# Patient Record
Sex: Male | Born: 1935
Health system: Southern US, Community
[De-identification: ages and names within clinical notes are randomized; demographics above are authoritative.]

## PROBLEM LIST (undated history)

## (undated) DIAGNOSIS — H409 Unspecified glaucoma: Secondary | ICD-10-CM

## (undated) DIAGNOSIS — E119 Type 2 diabetes mellitus without complications: Secondary | ICD-10-CM

## (undated) DIAGNOSIS — I2510 Atherosclerotic heart disease of native coronary artery without angina pectoris: Secondary | ICD-10-CM

## (undated) DIAGNOSIS — M129 Arthropathy, unspecified: Secondary | ICD-10-CM

## (undated) DIAGNOSIS — I10 Essential (primary) hypertension: Secondary | ICD-10-CM

## (undated) DIAGNOSIS — N400 Enlarged prostate without lower urinary tract symptoms: Secondary | ICD-10-CM

## (undated) DIAGNOSIS — I219 Acute myocardial infarction, unspecified: Secondary | ICD-10-CM

## (undated) DIAGNOSIS — M1990 Unspecified osteoarthritis, unspecified site: Secondary | ICD-10-CM

## (undated) DIAGNOSIS — K219 Gastro-esophageal reflux disease without esophagitis: Secondary | ICD-10-CM

## (undated) DIAGNOSIS — H269 Unspecified cataract: Secondary | ICD-10-CM

## (undated) DIAGNOSIS — L570 Actinic keratosis: Secondary | ICD-10-CM

## (undated) DIAGNOSIS — F0390 Unspecified dementia without behavioral disturbance: Secondary | ICD-10-CM

## (undated) DIAGNOSIS — H3530 Unspecified macular degeneration: Secondary | ICD-10-CM

## (undated) DIAGNOSIS — G2581 Restless legs syndrome: Secondary | ICD-10-CM

## (undated) HISTORY — DX: Unspecified macular degeneration: H35.30

## (undated) HISTORY — DX: Unspecified cataract: H26.9

## (undated) HISTORY — DX: Unspecified glaucoma: H40.9

## (undated) HISTORY — PX: COLONOSCOPY: SHX174

## (undated) HISTORY — DX: Actinic keratosis: L57.0

## (undated) HISTORY — PX: OTHER SURGICAL HISTORY: SHX169

## (undated) HISTORY — DX: Unspecified osteoarthritis, unspecified site: M19.90

## (undated) HISTORY — DX: Unspecified dementia, unspecified severity, without behavioral disturbance, psychotic disturbance, mood disturbance, and anxiety: F03.90

## (undated) HISTORY — PX: NO PAST SURGERIES: SHX2092

## (undated) HISTORY — DX: Arthropathy, unspecified: M12.9

## (undated) HISTORY — DX: Essential (primary) hypertension: I10

## (undated) HISTORY — DX: Type 2 diabetes mellitus without complications: E11.9

## (undated) HISTORY — DX: Acute myocardial infarction, unspecified: I21.9

## (undated) HISTORY — DX: Benign prostatic hyperplasia without lower urinary tract symptoms: N40.0

## (undated) HISTORY — DX: Gastro-esophageal reflux disease without esophagitis: K21.9

## (undated) HISTORY — DX: Restless legs syndrome: G25.81

## (undated) HISTORY — PX: CORONARY ANGIOPLASTY WITH STENT PLACEMENT: SHX49

---

## 2008-12-23 ENCOUNTER — Encounter: Payer: Self-pay | Admitting: Internal Medicine

## 2008-12-23 LAB — CONVERTED CEMR LAB
BUN: 19 mg/dL
Calcium: 9.3 mg/dL
Cholesterol: 137 mg/dL
Glucose, Bld: 102 mg/dL
LDL Cholesterol: 78 mg/dL
Potassium: 4.7 meq/L
Sodium: 140 meq/L
Total Protein: 6.9 g/dL

## 2009-06-01 ENCOUNTER — Encounter: Payer: Self-pay | Admitting: Internal Medicine

## 2009-06-02 ENCOUNTER — Encounter: Payer: Self-pay | Admitting: Internal Medicine

## 2009-09-26 ENCOUNTER — Encounter: Admission: RE | Admit: 2009-09-26 | Discharge: 2009-12-25 | Payer: Self-pay | Admitting: Optometry

## 2009-10-16 ENCOUNTER — Ambulatory Visit: Payer: Self-pay | Admitting: Internal Medicine

## 2009-10-16 DIAGNOSIS — M129 Arthropathy, unspecified: Secondary | ICD-10-CM | POA: Insufficient documentation

## 2009-10-16 DIAGNOSIS — H409 Unspecified glaucoma: Secondary | ICD-10-CM | POA: Insufficient documentation

## 2009-10-16 DIAGNOSIS — I10 Essential (primary) hypertension: Secondary | ICD-10-CM | POA: Insufficient documentation

## 2009-10-16 DIAGNOSIS — E118 Type 2 diabetes mellitus with unspecified complications: Secondary | ICD-10-CM

## 2009-10-18 ENCOUNTER — Encounter: Payer: Self-pay | Admitting: Internal Medicine

## 2009-10-18 DIAGNOSIS — N400 Enlarged prostate without lower urinary tract symptoms: Secondary | ICD-10-CM

## 2009-11-13 ENCOUNTER — Telehealth: Payer: Self-pay | Admitting: Internal Medicine

## 2009-11-15 ENCOUNTER — Encounter: Payer: Self-pay | Admitting: Internal Medicine

## 2009-12-08 ENCOUNTER — Encounter: Payer: Self-pay | Admitting: Internal Medicine

## 2009-12-11 ENCOUNTER — Telehealth: Payer: Self-pay | Admitting: Internal Medicine

## 2009-12-19 ENCOUNTER — Encounter: Payer: Self-pay | Admitting: Internal Medicine

## 2010-01-19 ENCOUNTER — Encounter: Payer: Self-pay | Admitting: Internal Medicine

## 2010-01-30 ENCOUNTER — Telehealth: Payer: Self-pay | Admitting: Internal Medicine

## 2010-02-21 ENCOUNTER — Telehealth: Payer: Self-pay | Admitting: Internal Medicine

## 2010-03-20 NOTE — Progress Notes (Signed)
Summary: prodigy glucose monitor  Phone Note Call from Patient Call back at Home Phone [phone]   Caller: Son Summary of Call: Md recieved letter from pt/ son requesting a prescription for the Prodigy voice glucose monitor. Pls send to [facility] [address] per md calld pt son to see how many times dad test so we can send strip & lancets also. son did not ansew LMOM RTC Initial call taken by: Orlan Leavens RMA,  November 13, 2009 10:00 AM  Follow-up for Phone Call        Once a day he tests, he was told to call back,per son. Follow-up by: Verdell Face,  November 13, 2009 2:03 PM  Additional Follow-up for Phone Call Additional follow up Details #1::        Sent Prodigy voice & supllies to [facility] [address] Additional Follow-up by: Orlan Leavens RMA,  November 13, 2009 2:39 PM    New/Updated Medications: PRODIGY VOICE BLOOD GLUCOSE W/DEVICE KIT (BLOOD GLUCOSE MONITORING SUPPL) use as directed PRODIGY NO CODING BLOOD GLUC  STRP (GLUCOSE BLOOD) use to check blood sugar once daily  dx: 250.00 PRODIGY SAFETY LANCETS 26G  MISC (LANCETS) use as directed  dx 250.00 Prescriptions: PRODIGY SAFETY LANCETS 26G  MISC (LANCETS) use as directed  dx 250.00  #30 x 6   Entered by:   Orlan Leavens RMA   Authorized by:   Newt Lukes MD   Signed by:   Orlan Leavens RMA on 11/13/2009   Method used:   Electronically to        [facility] [address] (retail)       [phone]       [place], [place]  [address]       Ph: [phone]       Fax: [phone]   RxID:   [id number] PRODIGY NO CODING BLOOD GLUC  STRP (GLUCOSE BLOOD) use to check blood sugar once daily  dx: 250.00  #30 x 6   Entered by:   Orlan Leavens RMA   Authorized by:   Newt Lukes MD   Signed by:   Orlan Leavens RMA on 11/13/2009   Method used:   Electronically to        [facility] [address] (retail)       [phone]       [place], [place]  [address]     Ph: [phone]       Fax: [phone]   RxID:   [id number] PRODIGY VOICE BLOOD GLUCOSE W/DEVICE KIT (BLOOD GLUCOSE MONITORING SUPPL) use as directed  #1 x 0   Entered by:   Orlan Leavens RMA   Authorized by:   Newt Lukes MD   Signed by:   Orlan Leavens RMA on 11/13/2009   Method used:   Electronically to        [facility] [address] (retail)       [phone]       [place], [place]  [address]       Ph: [phone]       Fax: [phone]   RxID:   [id number]

## 2010-03-20 NOTE — Letter (Signed)
Summary: Mercy Riding Sr MD  Mercy Riding Sr MD   Imported By: Lester Gratis 11/01/2009 09:47:34  _____________________________________________________________________  External Attachment:    Type:   Image     Comment:   External Document

## 2010-03-20 NOTE — Miscellaneous (Signed)
Summary: Order for low vision OT/[facility]  Order for low vision OT/[facility]   Imported By: Sherian Rein 12/21/2009 07:36:13  _____________________________________________________________________  External Attachment:    Type:   Image     Comment:   External Document

## 2010-03-20 NOTE — Miscellaneous (Signed)
Summary: Order/[facility]  Order/[facility]   Imported By: Sherian Rein 01/22/2010 12:12:29  _____________________________________________________________________  External Attachment:    Type:   Image     Comment:   External Document

## 2010-03-20 NOTE — Assessment & Plan Note (Signed)
Summary: NEW PT / MEDICARE/ [facility]/ [facility]  #   Vital Signs:  Patient profile:   77 year old male Height:      70 inches (177.80 cm) Weight:      190.4 pounds (86.55 kg) BMI:     27.42 O2 Sat:      97 % on Room air Temp:     97.0 degrees F (36.11 degrees C) oral Pulse rate:   67 / minute BP sitting:   124 / 72  (left arm) Cuff size:   regular  Vitals Entered By: Orlan Leavens RMA (October 16, 2009 9:46 AM)  O2 Flow:  Room air CC: New patient Is Patient Diabetic? Yes Did you bring your meter with you today? No Pain Assessment Patient in pain? no       Does patient need assistance? Functional Status Self care Ambulation Normal Comments Req refill on Clonazepam   Primary Care Provider:  Newt Lukes MD  CC:  New patient.  History of Present Illness: new pt to me and our practice, here to est care here with son, PCP care prev in [place] until 06/2009  1) DM2 - reports compliance with ongoing medical treatment and no changes in medication dose or frequency. denies adverse side effects related to current therapy. no signs or symptoms hypoglycemia - checks sugars infreq due to difficulty seeing blood drop after fingerstick - ?new meter to help this  2) HTN - reports compliance with ongoing medical treatment and no changes in medication dose or frequency. denies adverse side effects related to current therapy.  no CP, HA or edema  3) BPH - reports compliance with ongoing medical treatment and no changes in medication dose or frequency. denies adverse side effects related to current therapy. no dizziness or orthostatc symptoms - no dysuria or hematuria or incontinence  4) glaucoma - also macular degeneration - legally blind and does not drive due to same - follows with local optho  5) RLS - use klonopin at bedtime for control of same -   Preventive Screening-Counseling & Management  Alcohol-Tobacco     Alcohol drinks/day: 0     Alcohol Counseling: not indicated;  patient does not drink     Smoking Status: never     Tobacco Counseling: not indicated; no tobacco use  Caffeine-Diet-Exercise     Does Patient Exercise: yes     Depression Counseling: not indicated; screening negative for depression  Clinical Review Panels:  Immunizations   Last Flu Vaccine:  Fluvax 3+ (10/16/2009)   Current Medications (verified): 1)  Ranitidine Hcl 300 Mg Caps (Ranitidine Hcl) .... Take 1 At Bedtime 2)  Doxazosin Mesylate 8 Mg Tabs (Doxazosin Mesylate) .... Take 1 At Bedtime 3)  Clonazepam 0.5 Mg Tabs (Clonazepam) .... Take 1 At Bedtime 4)  Complete Allergy 25 Mg Caps (Diphenhydramine Hcl) .... Take 1 Two Times A Day 5)  Lisinopril 2.5 Mg Tabs (Lisinopril) .... Take 1 By Mouth Once Daily 6)  Glimepiride 2 Mg Tabs (Glimepiride) .... Take 1 By Mouth Once Daily 7)  Aspirin 325 Mg Tabs (Aspirin) .... Once Daily 8)  Icaps  Caps (Multiple Vitamins-Minerals) .... Take 2 By Mouth Once Daily 9)  Mega Red .... Take 1 By Mouth Once Daily  Allergies (verified): No Known Drug Allergies  Past History:  Past Medical History: Diabetes mellitus, type II Hypertension glaucoma macular degeneration, L>R vision loss GERD allergic rhinitis BPH restless leg syndrome  MD roster: optho - [facility] ([facility])  Past  Surgical History: Denies surgical history  Family History: reviewed - noncontributory  Social History: Never Smoked, no alcohol sngle, widowed 06/2006 moved to [place] from [place] 06/2009 to be near son/dtr lives alone & no driving, legally blind due to macular degeneration Smoking Status:  never Does Patient Exercise:  yes  Review of Systems       see HPI above. I have reviewed all other systems and they were negative.   Physical Exam  General:  alert, well-developed, well-nourished, and cooperative to examination.    Eyes:  vision grossly diminished L>R eye;  conjunctiva and lids normal.    Ears:  R ear normal and L ear normal.  wears bilateral  hearing aides Mouth:  full upper plate and gums in good repair; mucous membranes moist, without lesions or ulcers. oropharynx clear without exudate, no erythema.  Neck:  supple, full ROM, no masses, no thyromegaly; no thyroid nodules or tenderness. no JVD or carotid bruits.   Lungs:  normal respiratory effort, no intercostal retractions or use of accessory muscles; normal breath sounds bilaterally - no crackles and no wheezes.    Heart:  normal rate, regular rhythm, no murmur, and no rub. BLE without edema. normal DP pulses and normal cap refill in all 4 extremities    Abdomen:  soft, non-tender, normal bowel sounds, no distention; no masses and no appreciable hepatomegaly or splenomegaly.   Msk:  No deformity or scoliosis noted of thoracic or lumbar spine.   Neurologic:  alert & oriented X3 and cranial nerves II-XII symetrically intact.  strength normal in all extremities, sensation intact to light touch, and gait normal. speech fluent without dysarthria or aphasia; follows commands with good comprehension.  Skin:  no rashes, vesicles, ulcers, or erythema. No nodules or irregularity to palpation.  Psych:  Oriented X3, memory intact for recent and remote, normally interactive, good eye contact, not anxious appearing, not depressed appearing, and not agitated.      Impression & Recommendations:  Problem # 1:  DIABETES MELLITUS, TYPE II (ICD-250.00)  His updated medication list for this problem includes:    Lisinopril 2.5 Mg Tabs (Lisinopril) .Marland Kitchen... Take 1 by mouth once daily    Glimepiride 2 Mg Tabs (Glimepiride) .Marland Kitchen... Take 1 by mouth once daily    Aspirin 325 Mg Tabs (Aspirin) ..... Once daily  Orders: TLB-A1C / Hgb A1C (Glycohemoglobin) (83036-A1C) TLB-Creatinine, Blood (82565-CREA)  Problem # 2:  HYPERTENSION (ICD-401.9)  His updated medication list for this problem includes:    Doxazosin Mesylate 8 Mg Tabs (Doxazosin mesylate) .Marland Kitchen... Take 1 at bedtime    Lisinopril 2.5 Mg Tabs  (Lisinopril) .Marland Kitchen... Take 1 by mouth once daily  BP today: 124/72  Problem # 3:  GLUCOMA (ICD-365.9) L>R eye vision loss - related to glaucoma and macular degen - legally blind - depends on others for transportation -  need to eval if glucometer available for "finding the blood" after lance - unable to see to touch strip to drop follows with local optho - [facility]  Problem # 4:  ARTHRITIS (ICD-716.90)  Problem # 5:  BENIGN PROSTATIC HYPERTROPHY, WITH OBSTRUCTION (ICD-600.01)  His updated medication list for this problem includes:    Doxazosin Mesylate 8 Mg Tabs (Doxazosin mesylate) .Marland Kitchen... Take 1 at bedtime  Time spent with patient and son 45 minutes, more than 50% of this time was spent counseling patient on diabetes, reviewing current medications, need to review prior records and plans for f/u  Complete Medication List: 1)  Ranitidine Hcl 300  Mg Caps (Ranitidine hcl) .... Take 1 at bedtime 2)  Doxazosin Mesylate 8 Mg Tabs (Doxazosin mesylate) .... Take 1 at bedtime 3)  Clonazepam 0.5 Mg Tabs (Clonazepam) .... Take 1 at bedtime 4)  Complete Allergy 25 Mg Caps (Diphenhydramine hcl) .... Take 1 two times a day 5)  Lisinopril 2.5 Mg Tabs (Lisinopril) .... Take 1 by mouth once daily 6)  Glimepiride 2 Mg Tabs (Glimepiride) .... Take 1 by mouth once daily 7)  Aspirin 325 Mg Tabs (Aspirin) .... Once daily 8)  Icaps Caps (Multiple vitamins-minerals) .... Take 2 by mouth once daily 9)  Mega Red  .... Take 1 by mouth once daily  Other Orders: Flu Vaccine 68yrs + ([address]) Administration Flu vaccine - MCR (N5621) Flu Vaccine Consent Questions     Do you have a history of severe allergic reactions to this vaccine? no    Any prior history of allergic reactions to egg and/or gelatin? no    Do you have a sensitivity to the preservative Thimersol? no    Do you have a past history of Guillan-Barre Syndrome? no    Do you currently have an acute febrile illness? no    Have you ever had a severe reaction to  latex? no    Vaccine information given and explained to patient? yes    Are you currently pregnant? no    Lot Number:[id number]   Exp Date:08/18/2010   Site Given  Right Deltoid IM  Patient Instructions: 1)  it was good to see you today. 2)  medications and medical history reviewed today - no changes in medications at this time 3)  test(s) ordered today - your results will be posted on the phone tree for review in 48-72 hours from the time of test completion; call [phone] and enter your 9 digit MRN (listed above on this page, just below your name); if any changes need to be made or there are abnormal results, you will be contacted directly. 4)  refills as discussed - 5)  will send for copy of prior records to review from dr. chase 6)  will look into new meter and strips for monitoring your sugars 7)  Please schedule a follow-up appointment in 3-6 months, sooner if problems. will monitor your a1c and other labs as needed  Prescriptions: CLONAZEPAM 0.5 MG TABS (CLONAZEPAM) take 1 at bedtime  #30 x 6   Entered and Authorized by:   Newt Lukes MD   Signed by:   Newt Lukes MD on 10/16/2009   Method used:   Print then Give to Patient   RxID:   [id number]    .lbmedflu

## 2010-03-20 NOTE — Progress Notes (Signed)
Summary: Verbal  Phone Note Other Incoming   Caller: Erie Noe RN with [facility] [phone] Summary of Call: RN called to ask MD is it is okay for pt to contiune with OT and Aide in home due to vision. Verbal ok Initial call taken by: Margaret Pyle, CMA,  December 11, 2009 11:08 AM  Follow-up for Phone Call        yes - ok - thanks Follow-up by: Newt Lukes MD,  December 11, 2009 11:27 AM  Additional Follow-up for Phone Call Additional follow up Details #1::        Verbal okay given to Erie Noe RN at [facility] Additional Follow-up by: Margaret Pyle, CMA,  December 11, 2009 12:42 PM

## 2010-03-20 NOTE — Medication Information (Signed)
Summary: Rx request for Glucose meter/Patient  Rx request for Glucose meter/Patient   Imported By: Sherian Rein 11/15/2009 08:56:08  _____________________________________________________________________  External Attachment:    Type:   Image     Comment:   External Document

## 2010-03-20 NOTE — Miscellaneous (Signed)
Summary: OT Order / [facility]  OT Order / [facility]   Imported By: Lennie Odor 12/12/2009 10:23:26  _____________________________________________________________________  External Attachment:    Type:   Image     Comment:   External Document

## 2010-03-22 NOTE — Progress Notes (Signed)
Summary: [facility]  Phone Note Refill Request Message from:  Fax from Pharmacy on January 30, 2010 8:49 AM  Refills Requested: Medication #1:  DOXAZOSIN MESYLATE 8 MG TABS take 1 at bedtime  Method Requested: Electronic Initial call taken by: Orlan Leavens RMA,  January 30, 2010 8:49 AM    Prescriptions: DOXAZOSIN MESYLATE 8 MG TABS (DOXAZOSIN MESYLATE) take 1 at bedtime  #30 x 4   Entered by:   Orlan Leavens RMA   Authorized by:   Newt Lukes MD   Signed by:   Orlan Leavens RMA on 01/30/2010   Method used:   Electronically to        [facility] [address] (retail)       [phone]       [place], [place]  [address]       Ph: [phone]       Fax: [phone]   RxID:   [id number]

## 2010-03-22 NOTE — Progress Notes (Signed)
Summary: amaryl  Phone Note Refill Request Message from:  Fax from Pharmacy on February 21, 2010 10:17 AM  Refills Requested: Medication #1:  GLIMEPIRIDE 2 MG TABS take 1 by mouth once daily   Last Refilled: 11/30/2009  Method Requested: Electronic Initial call taken by: Orlan Leavens RMA,  February 21, 2010 10:17 AM    Prescriptions: GLIMEPIRIDE 2 MG TABS (GLIMEPIRIDE) take 1 by mouth once daily  #90 x 2   Entered by:   Orlan Leavens RMA   Authorized by:   Newt Lukes MD   Signed by:   Orlan Leavens RMA on 02/21/2010   Method used:   Electronically to        [facility] [address] (retail)       [phone]       [place], [place]  [address]       Ph: [phone]       Fax: [phone]   RxID:   [id number]

## 2010-03-23 ENCOUNTER — Encounter: Payer: Self-pay | Admitting: Internal Medicine

## 2010-04-05 NOTE — Letter (Signed)
Summary: Medical exam forms/[facility]  Medical exam forms/[facility]   Imported By: Sherian Rein 03/29/2010 09:29:42  _____________________________________________________________________  External Attachment:    Type:   Image     Comment:   External Document

## 2010-04-16 ENCOUNTER — Other Ambulatory Visit: Payer: Self-pay | Admitting: Internal Medicine

## 2010-04-16 ENCOUNTER — Encounter: Payer: Self-pay | Admitting: Internal Medicine

## 2010-04-16 ENCOUNTER — Other Ambulatory Visit: Payer: Medicare Other

## 2010-04-16 ENCOUNTER — Ambulatory Visit (INDEPENDENT_AMBULATORY_CARE_PROVIDER_SITE_OTHER): Payer: Medicare Other | Admitting: Internal Medicine

## 2010-04-16 DIAGNOSIS — L570 Actinic keratosis: Secondary | ICD-10-CM | POA: Insufficient documentation

## 2010-04-16 DIAGNOSIS — E119 Type 2 diabetes mellitus without complications: Secondary | ICD-10-CM

## 2010-04-16 LAB — CREATININE, SERUM: Creatinine, Ser: 1.2 mg/dL (ref 0.4–1.5)

## 2010-04-16 LAB — MICROALBUMIN / CREATININE URINE RATIO: Microalb Creat Ratio: 0.2 mg/g (ref 0.0–30.0)

## 2010-04-17 ENCOUNTER — Encounter (INDEPENDENT_AMBULATORY_CARE_PROVIDER_SITE_OTHER): Payer: Self-pay | Admitting: *Deleted

## 2010-04-26 NOTE — Assessment & Plan Note (Signed)
Summary: 3-6 MTH FU/D/T--STC   Vital Signs:  Patient profile:   77 year old male Height:      70 inches (177.80 cm) Weight:      191.12 pounds (86.87 kg) O2 Sat:      95 % on Room air Temp:     97.5 degrees F (36.39 degrees C) oral Pulse rate:   71 / minute BP sitting:   110 / 62  (left arm) Cuff size:   regular  Vitals Entered By: Orlan Leavens RMA (April 16, 2010 9:11 AM)  O2 Flow:  Room air CC: 6 month follow-up Is Patient Diabetic? Yes Did you bring your meter with you today? Yes Pain Assessment Patient in pain? no      6  Primary Care Provider:  Newt Lukes MD  CC:  6 month follow-up.  History of Present Illness: here for f/u here with son, PCP care prev in [place] until 06/2009  1) DM2 - reports compliance with ongoing medical treatment and no changes in medication dose or frequency. denies adverse side effects related to current therapy. no signs or symptoms hypoglycemia - checks sugars infreq due to difficulty seeing blood drop after fingerstick -  2) HTN - reports compliance with ongoing medical treatment and no changes in medication dose or frequency. denies adverse side effects related to current therapy.  no CP, HA or edema  3) BPH - reports compliance with ongoing medical treatment and no changes in medication dose or frequency. denies adverse side effects related to current therapy. no dizziness or orthostatic symptoms - no dysuria or hematuria or incontinence  4) glaucoma - also macular degeneration - legally blind and does not drive due to same - follows with local optho  5) RLS - use klonopin at bedtime for control of same -   Clinical Review Panels:  Lipid Management   Cholesterol:  137 (12/23/2008)   LDL (bad choesterol):  78 (12/23/2008)   HDL (good cholesterol):  25 (12/23/2008)   Triglycerides:  171 (12/23/2008)  Diabetes Management   HgBA1C:  5.6 (10/16/2009)   Creatinine:  1.3 (10/16/2009)   Last Flu Vaccine:  Fluvax 3+  (10/16/2009)  Complete Metabolic Panel   Glucose:  102 (12/23/2008)   Sodium:  140 (12/23/2008)   Potassium:  4.7 (12/23/2008)   Chloride:  102 (12/23/2008)   CO2:  28 (12/23/2008)   BUN:  19 (12/23/2008)   Creatinine:  1.3 (10/16/2009)   Albumin:  4.3 (12/23/2008)   Total Protein:  6.9 (12/23/2008)   Calcium:  9.3 (12/23/2008)   Total Bili:  0.7 (12/23/2008)   Alk Phos:  46 (12/23/2008)   SGPT (ALT):  20 (12/23/2008)   SGOT (AST):  15 (12/23/2008)   Current Medications (verified): 1)  Ranitidine Hcl 300 Mg Caps (Ranitidine Hcl) .... Take 1 At Bedtime 2)  Doxazosin Mesylate 8 Mg Tabs (Doxazosin Mesylate) .... Take 1 At Bedtime 3)  Clonazepam 0.5 Mg Tabs (Clonazepam) .... Take 1 At Bedtime 4)  Complete Allergy 25 Mg Caps (Diphenhydramine Hcl) .... Take 1 Two Times A Day 5)  Lisinopril 2.5 Mg Tabs (Lisinopril) .... Take 1 By Mouth Once Daily 6)  Glimepiride 2 Mg Tabs (Glimepiride) .... Take 1 By Mouth Once Daily 7)  Aspirin 325 Mg Tabs (Aspirin) .... Once Daily 8)  Icaps  Caps (Multiple Vitamins-Minerals) .... Take 2 By Mouth Once Daily 9)  Mega Red .... Take 1 By Mouth Once Daily 10)  Prodigy No Coding Blood Gluc  Strp (Glucose  Blood) .... Use To Check Blood Sugar Once Daily  Dx: 250.00 11)  Prodigy Safety Lancets 26g  Misc (Lancets) .... Use As Directed  Dx 250.00 12)  Tylenol Pm Extra Strength 500-25 Mg Tabs (Diphenhydramine-Apap (Sleep)) .... Take 1 By Mouth At Bedtime 13)  Lumigan 0.03 % Soln (Bimatoprost) .... Use 1 Drop in Both Eyes At Bedtime 14)  Timolol Maleate 0.5 % Soln (Timolol Maleate) .... Use 1 Drop Two Times A Day in Both Eyes  Allergies (verified): No Known Drug Allergies  Past History:  Past Medical History: Diabetes mellitus, type II Hypertension glaucoma macular degeneration, L>R vision loss GERD allergic rhinitis BPH restless leg syndrome   MD roster: optho - [facility] ([facility])  Social History: Never Smoked, no alcohol sngle, widowed 06/2006    moved to [place] from [place] 06/2009 to be near son/dtr lives alone & no driving, legally blind due to macular degeneration  Review of Systems  The patient denies anorexia, fever, weight loss, weight gain, peripheral edema, headaches, and abdominal pain.    Physical Exam  General:  alert, well-developed, well-nourished, and cooperative to examination.    Lungs:  normal respiratory effort, no intercostal retractions or use of accessory muscles; normal breath sounds bilaterally - no crackles and no wheezes.    Heart:  normal rate, regular rhythm, no murmur, and no rub. BLE without edema. normal DP pulses and normal cap refill in all 4 extremities    Skin:  mulitple AK on scalp, one slightly pearly appearance Psych:  Oriented X3, memory intact for recent and remote, normally interactive, good eye contact, not anxious appearing, not depressed appearing, and not agitated.      Impression & Recommendations:  Problem # 1:  DIABETES MELLITUS, TYPE II (ICD-250.00)  His updated medication list for this problem includes:    Lisinopril 2.5 Mg Tabs (Lisinopril) .Marland Kitchen... Take 1 by mouth once daily    Glimepiride 2 Mg Tabs (Glimepiride) .Marland Kitchen... Take 1 by mouth once daily    Aspirin 325 Mg Tabs (Aspirin) ..... Once daily  Orders: TLB-A1C / Hgb A1C (Glycohemoglobin) (83036-A1C) TLB-Microalbumin/Creat Ratio, Urine (82043-MALB) TLB-Creatinine, Blood (82565-CREA)  Labs Reviewed: Creat: 1.3 (10/16/2009)    Reviewed HgBA1c results: 5.6 (10/16/2009)  Problem # 2:  ACTINIC KERATOSIS (ICD-702.0)  Orders: Dermatology Referral (Derma)  Complete Medication List: 1)  Ranitidine Hcl 300 Mg Caps (Ranitidine hcl) .... Take 1 at bedtime 2)  Doxazosin Mesylate 8 Mg Tabs (Doxazosin mesylate) .... Take 1 at bedtime 3)  Clonazepam 0.5 Mg Tabs (Clonazepam) .... Take 1 at bedtime 4)  Complete Allergy 25 Mg Caps (Diphenhydramine hcl) .... Take 1 two times a day 5)  Lisinopril 2.5 Mg Tabs (Lisinopril) .... Take 1 by  mouth once daily 6)  Glimepiride 2 Mg Tabs (Glimepiride) .... Take 1 by mouth once daily 7)  Aspirin 325 Mg Tabs (Aspirin) .... Once daily 8)  Icaps Caps (Multiple vitamins-minerals) .... Take 2 by mouth once daily 9)  Mega Red  .... Take 1 by mouth once daily 10)  Prodigy No Coding Blood Gluc Strp (Glucose blood) .... Use to check blood sugar once daily  dx: 250.00 11)  Prodigy Safety Lancets 26g Misc (Lancets) .... Use as directed  dx 250.00 12)  Tylenol Pm Extra Strength 500-25 Mg Tabs (Diphenhydramine-apap (sleep)) .... Take 1 by mouth at bedtime 13)  Lumigan 0.03 % Soln (Bimatoprost) .... Use 1 drop in both eyes at bedtime 14)  Timolol Maleate 0.5 % Soln (Timolol maleate) .... Use 1 drop  two times a day in both eyes  Patient Instructions: 1)  it was good to see you today. 2)  medications and medical history reviewed today - no changes in medications at this time 3)  test(s) ordered today - your results will be  called to you after review in 48-72 hours from the time of test completion; if any changes need to be made or there are abnormal results, you will be contacted directly. 4)  refills as discussed - 5)  Please schedule a follow-up appointment in 3-6 months, sooner if problems. will monitor your a1c and other labs as needed  Prescriptions: CLONAZEPAM 0.5 MG TABS (CLONAZEPAM) take 1 at bedtime  #90 x 1   Entered and Authorized by:   Newt Lukes MD   Signed by:   Newt Lukes MD on 04/16/2010   Method used:   Print then Give to Patient   RxID:   [id number]    Orders Added: 1)  Est. Patient Level IV [[address]78] 2)  TLB-A1C / Hgb A1C (Glycohemoglobin) [83036-A1C] 3)  TLB-Microalbumin/Creat Ratio, Urine [82043-MALB] 4)  TLB-Creatinine, Blood [82565-CREA] 5)  Dermatology Referral [Derma]

## 2010-04-26 NOTE — Letter (Addendum)
Summary: Primary Care Consult Scheduled Letter  [facility]  [address] [place], [place] [address]   Phone: [phone]  Fax: [phone]      04/17/2010 MRN: [id number]  [facility] [address] [place], [place]  [address]  [place]    Dear Mr. Erman,      We have scheduled an appointment for you.  At the recommendation of Dr.Leschber, we have scheduled you a consult with [facility] ( Dr. Emily Filbert) on March 21,2012 at 11:10 Am / arrive at 10:00 am Their address is [address], [place], [place] . The office phone number is [phone]  If this appointment day and time is not convenient for you, please feel free to call the office of the doctor you are being referred to at the number listed above and reschedule the appointment.     It is important for you to keep your scheduled appointments. We are here to make sure you are given good patient care. If you have questions or you have made changes to your appointment, please notify us at  [phone]        , ask for   Debra           .   -Please bring Insurance card  -Photo ID -Any  co-pay  due at time of visit  Please give a 24 hr notice if you can not make this appt to [facility]  to avoid a $25.00  cancellation fee   Thank you,  Patient Care Coordinator [facility]

## 2010-06-07 ENCOUNTER — Other Ambulatory Visit: Payer: Self-pay

## 2010-06-07 MED ORDER — DOXAZOSIN MESYLATE 8 MG PO TABS: 8.0000 mg | ORAL_TABLET | Freq: Every day | ORAL | Status: DC

## 2010-06-11 ENCOUNTER — Other Ambulatory Visit: Payer: Self-pay

## 2010-06-11 MED ORDER — RANITIDINE HCL 300 MG PO TABS: 300.0000 mg | ORAL_TABLET | Freq: Every day | ORAL | Status: DC

## 2010-08-09 ENCOUNTER — Encounter: Payer: Self-pay | Admitting: Internal Medicine

## 2010-08-09 ENCOUNTER — Ambulatory Visit (INDEPENDENT_AMBULATORY_CARE_PROVIDER_SITE_OTHER): Payer: Medicare Other | Admitting: Internal Medicine

## 2010-08-09 ENCOUNTER — Other Ambulatory Visit: Payer: Self-pay | Admitting: *Deleted

## 2010-08-09 VITALS — BP 122/66 | HR 72 | Temp 98.3°F | Ht 70.0 in | Wt 193.4 lb

## 2010-08-09 DIAGNOSIS — J209 Acute bronchitis, unspecified: Secondary | ICD-10-CM

## 2010-08-09 MED ORDER — RANITIDINE HCL 300 MG PO TABS: 300.0000 mg | ORAL_TABLET | Freq: Every day | ORAL | Status: DC

## 2010-08-09 MED ORDER — GLIMEPIRIDE 2 MG PO TABS: 2.0000 mg | ORAL_TABLET | Freq: Every day | ORAL | Status: DC

## 2010-08-09 MED ORDER — AMOXICILLIN-POT CLAVULANATE 875-125 MG PO TABS: 1.0000 | ORAL_TABLET | Freq: Two times a day (BID) | ORAL | Status: AC

## 2010-08-09 MED ORDER — LISINOPRIL 2.5 MG PO TABS: 2.5000 mg | ORAL_TABLET | Freq: Every day | ORAL | Status: DC

## 2010-08-09 MED ORDER — DOXAZOSIN MESYLATE 8 MG PO TABS: 8.0000 mg | ORAL_TABLET | Freq: Every day | ORAL | Status: DC

## 2010-08-09 MED ORDER — CLONAZEPAM 0.5 MG PO TABS: 0.5000 mg | ORAL_TABLET | Freq: Every evening | ORAL | Status: DC | PRN

## 2010-08-09 NOTE — Telephone Encounter (Signed)
[facility] med to [email or web address] KitchenMarland Kitchen6/21/12@11 :21am/LMB

## 2010-08-09 NOTE — Patient Instructions (Signed)
It was good to see you today. Augmentin antibiotics for your bronchitis - Your prescription(s) have been submitted to your pharmacy. Please take as directed and contact our office if you believe you are having problem(s) with the medication(s). Use Mucinex DM pills 2x/day as needed for cough symptoms

## 2010-08-09 NOTE — Progress Notes (Signed)
  Subjective:    Patient ID: Timothy Snyder, male    DOB: [birth year], 77 y.o.   MRN: [id number]  HPI complains of cough Onset 2 weeks ago Precipitated by URI and sick exposures Productive sputum - green and yellow, no blood LGF, but no HA or face pain No SOB or CP or pleurisy  Also reviewed chronic medical issues  DM2 - reports compliance with ongoing medical treatment and no changes in medication dose or frequency. denies adverse side effects related to current therapy. no signs or symptoms hypoglycemia - checks sugars infreq due to difficulty seeing blood drop after fingerstick - ?new meter to help this   HTN - reports compliance with ongoing medical treatment and no changes in medication dose or frequency. denies adverse side effects related to current therapy. no CP, HA or edema   BPH - reports compliance with ongoing medical treatment and no changes in medication dose or frequency. denies adverse side effects related to current therapy. no dizziness or orthostatc symptoms - no dysuria or hematuria or incontinence   Eye problems: glaucoma and macular degeneration - legally blind and does not drive due to same - follows with local optho   RLS - use klonopin at bedtime for control of same -   Past Medical History  Diagnosis Date  . Actinic keratosis   . ARTHRITIS   . Macular degeneration     L>R vision loss, legally blind  . Restless leg syndrome   . BPH (benign prostatic hyperplasia)   . HYPERTENSION   . GLUCOMA   . GERD (gastroesophageal reflux disease)   . DIABETES MELLITUS, TYPE II      Review of Systems  HENT: Positive for congestion and postnasal drip. Negative for sinus pressure.   Respiratory: Negative for chest tightness, shortness of breath and wheezing.   Cardiovascular: Negative for leg swelling.  Neurological: Negative for headaches.       Objective:   Physical Exam BP 122/66  Pulse 72  Temp(Src) 98.3 F (36.8 C) (Oral)  Ht 5\' 10"  (1.778 m)  Wt 193  lb 6.4 oz (87.726 kg)  BMI 27.75 kg/m2  SpO2 94%  Physical Exam  Constitutional:  oriented to person, place, and time. appears well-developed and well-nourished. No distress. Son at side HENT: NCAT, B TMs ok without redness or effusion; No sinus tenderness; OP clear Neck: Normal range of motion. Neck supple. No JVD present. No thyromegaly present.  Cardiovascular: Normal rate, regular rhythm and normal heart sounds.  No murmur heard. no BLE edema Pulmonary/Chest: Effort normal -breath sounds with few rhinchi. No respiratory distress. no wheezes.  Psychiatric: he has a normal mood and affect. behavior is normal. Judgment and thought content normal.   Lab Results  Component Value Date   CHOL 137 12/23/2008   HDL 25 12/23/2008   ALT 20 40/10/8117   AST 15 12/23/2008   NA 140 12/23/2008   K 4.7 12/23/2008   CL 102 12/23/2008   CREATININE 1.2 04/16/2010   BUN 19 12/23/2008   CO2 28 12/23/2008   PSA 0.9 12/23/2008   HGBA1C 6.1 04/16/2010   MICROALBUR 0.4 04/16/2010        Assessment & Plan:  Acute bronchitis - augmentin x 7 d -  erx done - continue OTC cough medication and call if worse/unimproved

## 2010-08-09 NOTE — Telephone Encounter (Signed)
MD ok refill  For 90. Printed rx fax to pharmacy...08/09/10@12 :03pm/LMB

## 2010-09-27 ENCOUNTER — Encounter: Payer: Self-pay | Admitting: Internal Medicine

## 2010-09-27 ENCOUNTER — Ambulatory Visit (INDEPENDENT_AMBULATORY_CARE_PROVIDER_SITE_OTHER): Payer: Medicare Other | Admitting: Internal Medicine

## 2010-09-27 ENCOUNTER — Other Ambulatory Visit (INDEPENDENT_AMBULATORY_CARE_PROVIDER_SITE_OTHER): Payer: Medicare Other

## 2010-09-27 VITALS — BP 118/62 | HR 73 | Temp 97.0°F | Ht 70.0 in | Wt 195.0 lb

## 2010-09-27 DIAGNOSIS — R197 Diarrhea, unspecified: Secondary | ICD-10-CM

## 2010-09-27 DIAGNOSIS — I10 Essential (primary) hypertension: Secondary | ICD-10-CM

## 2010-09-27 DIAGNOSIS — E119 Type 2 diabetes mellitus without complications: Secondary | ICD-10-CM

## 2010-09-27 LAB — CBC WITH DIFFERENTIAL/PLATELET
Basophils Relative: 0.4 % (ref 0.0–3.0)
Eosinophils Absolute: 0.2 10*3/uL (ref 0.0–0.7)
Eosinophils Relative: 2.9 % (ref 0.0–5.0)
Hemoglobin: 14.9 g/dL (ref 13.0–17.0)
Lymphocytes Relative: 28 % (ref 12.0–46.0)
MCHC: 34 g/dL (ref 30.0–36.0)
Monocytes Relative: 11.7 % (ref 3.0–12.0)
Neutro Abs: 3.4 10*3/uL (ref 1.4–7.7)
RBC: 4.85 Mil/uL (ref 4.22–5.81)
WBC: 5.9 10*3/uL (ref 4.5–10.5)

## 2010-09-27 LAB — LIPID PANEL
HDL: 28.8 mg/dL — ABNORMAL LOW (ref >39.00)
LDL Cholesterol: 89 mg/dL (ref 0–99)
Total CHOL/HDL Ratio: 5
Triglycerides: 184 mg/dL — ABNORMAL HIGH (ref 0.0–149.0)

## 2010-09-27 LAB — BASIC METABOLIC PANEL
CO2: 29 mEq/L (ref 19–32)
Calcium: 8.9 mg/dL (ref 8.4–10.5)
Sodium: 139 mEq/L (ref 135–145)

## 2010-09-27 LAB — HEPATIC FUNCTION PANEL
AST: 19 U/L (ref 0–37)
Albumin: 4.1 g/dL (ref 3.5–5.2)
Total Protein: 6.8 g/dL (ref 6.0–8.3)

## 2010-09-27 NOTE — Assessment & Plan Note (Signed)
BP Readings from Last 3 Encounters:  09/27/10 118/62  08/09/10 122/66  04/16/10 110/62   The current medical regimen is effective;  continue present plan and medications.

## 2010-09-27 NOTE — Assessment & Plan Note (Signed)
Well controlled by hx - check a1c now The current medical regimen is effective;  continue present plan and medications.  Lab Results  Component Value Date   HGBA1C 6.1 04/16/2010

## 2010-09-27 NOTE — Patient Instructions (Addendum)
It was good to see you today. Test(s) ordered today. Your results will be called to you after review (48-72hours after test completion). If any changes need to be made, you will be notified at that time. Medications reviewed, no changes at this time. Will send for colonoscopy report from [place] as discussed Please schedule followup in 6 months, call sooner if problems.

## 2010-09-27 NOTE — Progress Notes (Signed)
Subjective:    Patient ID: Timothy Snyder, male    DOB: May 13, 1927, 77 y.o.   MRN: [id number]  HPI  Here for follow up - reviewed chronic medical issues  DM2 - reports compliance with ongoing medical treatment and no changes in medication dose or frequency. denies adverse side effects related to current therapy. no signs or symptoms hypoglycemia - checks sugars infreq due to difficulty seeing blood drop after fingerstick -  HTN - reports compliance with ongoing medical treatment and no changes in medication dose or frequency. denies adverse side effects related to current therapy. no CP, HA or edema   BPH - reports compliance with ongoing medical treatment and no changes in medication dose or frequency. denies adverse side effects related to current therapy. no dizziness or orthostatc symptoms - no dysuria or hematuria or incontinence   Eye problems: glaucoma and macular degeneration - legally blind and does not drive due to same - follows with local optho   RLS - use klonopin at bedtime for control of same -   complains of diarrhea - reports increase in BMS this summer - chronically loose but now 3-4/day - ?precipitated by change in diet this summer (3 different summer camps)- no fever, no weight loss, no cramping, no incontinence - otc meds not tried  Past Medical History  Diagnosis Date  . Actinic keratosis   . ARTHRITIS   . Macular degeneration     L>R vision loss, legally blind  . Restless leg syndrome   . BPH (benign prostatic hyperplasia)   . HYPERTENSION   . GLUCOMA   . GERD (gastroesophageal reflux disease)   . DIABETES MELLITUS, TYPE II      Review of Systems  Respiratory: Negative for shortness of breath.   Cardiovascular: Negative for chest pain.  Gastrointestinal: Negative for nausea, vomiting, abdominal pain, constipation, blood in stool, anal bleeding and rectal pain.       Objective:   Physical Exam  BP 118/62  Pulse 73  Temp(Src) 97 F (36.1 C) (Oral)   Ht 5\' 10"  (1.778 m)  Wt 195 lb (88.451 kg)  BMI 27.98 kg/m2  SpO2 94% Constitutional:  oriented to person, place, and time. appears well-developed and well-nourished. No distress. Son at side Neck: Normal range of motion. Neck supple. No JVD present. No thyromegaly present.  Cardiovascular: Normal rate, regular rhythm and normal heart sounds.  No murmur heard. no BLE edema Pulmonary/Chest: Effort normal -breath sounds normal. No respiratory distress. no wheezes.  Abdominal: protuberant and firm but NT, ND, no mass and +BS Psychiatric: he has a normal mood and affect. behavior is normal. Judgment and thought content normal.   Lab Results  Component Value Date   CHOL 137 12/23/2008   HDL 25 12/23/2008   ALT 20 40/10/8117   AST 15 12/23/2008   NA 140 12/23/2008   K 4.7 12/23/2008   CL 102 12/23/2008   CREATININE 1.2 04/16/2010   BUN 19 12/23/2008   CO2 28 12/23/2008   PSA 0.9 12/23/2008   HGBA1C 6.1 04/16/2010   MICROALBUR 0.4 04/16/2010        Assessment & Plan:   See problem list. Medications and labs reviewed today.  "Diarrhea" - loose stool, 3-4 bm/day for several weeks - no red flags on hx or exam for infection, blood loss, malabs - chronically loose but now increase freq. Check labs for cbc and thryoid - last colo in [place] - will send for copy of report and consider GI eval  if not improving

## 2010-09-28 LAB — HEMOGLOBIN A1C: Hgb A1c MFr Bld: 6 % (ref 4.6–6.5)

## 2011-01-04 ENCOUNTER — Other Ambulatory Visit: Payer: Self-pay | Admitting: Internal Medicine

## 2011-01-05 ENCOUNTER — Other Ambulatory Visit: Payer: Self-pay | Admitting: Internal Medicine

## 2011-01-07 ENCOUNTER — Other Ambulatory Visit: Payer: Self-pay | Admitting: *Deleted

## 2011-01-07 MED ORDER — GLUCOSE BLOOD VI STRP: 1.0000 | ORAL_STRIP | Freq: Every day | Status: DC

## 2011-01-07 NOTE — Telephone Encounter (Signed)
[facility]...01/07/11@1 :30pm/LMB

## 2011-02-02 ENCOUNTER — Other Ambulatory Visit: Payer: Self-pay | Admitting: Internal Medicine

## 2011-02-27 DIAGNOSIS — L570 Actinic keratosis: Secondary | ICD-10-CM | POA: Diagnosis not present

## 2011-02-27 DIAGNOSIS — D485 Neoplasm of uncertain behavior of skin: Secondary | ICD-10-CM | POA: Diagnosis not present

## 2011-02-27 DIAGNOSIS — L820 Inflamed seborrheic keratosis: Secondary | ICD-10-CM | POA: Diagnosis not present

## 2011-03-12 DIAGNOSIS — H409 Unspecified glaucoma: Secondary | ICD-10-CM | POA: Diagnosis not present

## 2011-03-12 DIAGNOSIS — H4010X0 Unspecified open-angle glaucoma, stage unspecified: Secondary | ICD-10-CM | POA: Diagnosis not present

## 2011-03-13 ENCOUNTER — Other Ambulatory Visit: Payer: Self-pay | Admitting: Internal Medicine

## 2011-03-14 NOTE — Telephone Encounter (Signed)
Faxed script back to [facility] @ [phone].Marland KitchenMarland Kitchen1/24/13@8 :33am/LMB

## 2011-03-20 DIAGNOSIS — H353190 Nonexudative age-related macular degeneration, unspecified eye, stage unspecified: Secondary | ICD-10-CM | POA: Diagnosis not present

## 2011-03-20 DIAGNOSIS — H409 Unspecified glaucoma: Secondary | ICD-10-CM | POA: Diagnosis not present

## 2011-03-20 DIAGNOSIS — H4011X0 Primary open-angle glaucoma, stage unspecified: Secondary | ICD-10-CM | POA: Diagnosis not present

## 2011-03-20 LAB — HM DIABETES EYE EXAM

## 2011-03-21 ENCOUNTER — Encounter: Payer: Self-pay | Admitting: Internal Medicine

## 2011-03-28 DIAGNOSIS — H4011X0 Primary open-angle glaucoma, stage unspecified: Secondary | ICD-10-CM | POA: Diagnosis not present

## 2011-03-28 DIAGNOSIS — H409 Unspecified glaucoma: Secondary | ICD-10-CM | POA: Diagnosis not present

## 2011-04-02 ENCOUNTER — Other Ambulatory Visit (INDEPENDENT_AMBULATORY_CARE_PROVIDER_SITE_OTHER): Payer: Medicare Other

## 2011-04-02 ENCOUNTER — Encounter: Payer: Self-pay | Admitting: Internal Medicine

## 2011-04-02 ENCOUNTER — Ambulatory Visit (INDEPENDENT_AMBULATORY_CARE_PROVIDER_SITE_OTHER): Payer: Medicare Other | Admitting: Internal Medicine

## 2011-04-02 VITALS — BP 112/60 | HR 64 | Temp 97.5°F | Ht 70.0 in | Wt 192.4 lb

## 2011-04-02 DIAGNOSIS — H409 Unspecified glaucoma: Secondary | ICD-10-CM | POA: Diagnosis not present

## 2011-04-02 DIAGNOSIS — E119 Type 2 diabetes mellitus without complications: Secondary | ICD-10-CM

## 2011-04-02 DIAGNOSIS — Z0000 Encounter for general adult medical examination without abnormal findings: Secondary | ICD-10-CM | POA: Diagnosis not present

## 2011-04-02 DIAGNOSIS — I10 Essential (primary) hypertension: Secondary | ICD-10-CM | POA: Diagnosis not present

## 2011-04-02 DIAGNOSIS — Z23 Encounter for immunization: Secondary | ICD-10-CM

## 2011-04-02 NOTE — Assessment & Plan Note (Signed)
Legally blind with macular degeneration - follows with optho regularly for same ([place])

## 2011-04-02 NOTE — Assessment & Plan Note (Signed)
BP Readings from Last 3 Encounters:  04/02/11 112/60  09/27/10 118/62  08/09/10 122/66   The current medical regimen is effective;  continue present plan and medications. On ACEI

## 2011-04-02 NOTE — Patient Instructions (Addendum)
It was good to see you today. Test(s) ordered today. Your results will be called to you after review (48-72hours after test completion). If any changes need to be made, you will be notified at that time. Medications reviewed, no changes at this time. Please schedule followup in 6 months for diabetes mellitus check, call sooner if problems. Tetanus booster and pneumonia shot provided today

## 2011-04-02 NOTE — Progress Notes (Signed)
Subjective:    Patient ID: Timothy Snyder, male    DOB: [birth year], 78 y.o.   MRN: [id number]  HPI   Here for medicare wellness  Diet: heart healthy, diabetic Physical activity: sedentary Depression/mood screen: negative Hearing: intact to whispered voice Visual acuity: grossly impaired without change, performs annual eye exam  ADLs: capable Fall risk: none Home safety: good Cognitive evaluation: intact to orientation, naming, recall and repetition EOL planning: adv directives, full code/ I agree  I have personally reviewed and have noted 1. The patient's medical and social history 2. Their use of alcohol, tobacco or illicit drugs 3. Their current medications and supplements 4. The patient's functional ability including ADL's, fall risks, home safety risks and hearing or visual impairment. 5. Diet and physical activities 6. Evidence for depression or mood disorders  Also reviewed chronic medical issues  DM2 - reports compliance with ongoing medical treatment and no changes in medication dose or frequency. denies adverse side effects related to current therapy. no signs or symptoms hypoglycemia - checks sugars infreq due to difficulty seeing blood drop after fingerstick -   HTN - reports compliance with ongoing medical treatment and no changes in medication dose or frequency. denies adverse side effects related to current therapy. no chest pain, headache or edema   BPH - reports compliance with ongoing medical treatment and no changes in medication dose or frequency. denies adverse side effects related to current therapy. no dizziness or orthostatc symptoms - no dysuria or hematuria or incontinence   Eye problems: glaucoma and macular degeneration - legally blind and does not drive due to same - follows with local optho   RLS - use klonopin at bedtime for control of same -   Past Medical History  Diagnosis Date  . Actinic keratosis   . ARTHRITIS   . Macular degeneration     L>R vision loss, legally blind  . Restless leg syndrome   . BPH (benign prostatic hyperplasia)   . HYPERTENSION   . GLUCOMA   . GERD (gastroesophageal reflux disease)   . DIABETES MELLITUS, TYPE II    History reviewed. No pertinent family history. History  Substance Use Topics  . Smoking status: Never Smoker   . Smokeless tobacco: Not on file   Comment: Single, widowed 06/2006. Moved to [place] from [place] 06/2009 to be near son/dtr. Lives alone & no driving, legally blind due to macular degeneration  . Alcohol Use: No    Review of Systems  Constitutional: Negative for fever and fatigue.  HENT: Negative for rhinorrhea and sinus pressure.   Respiratory: Negative for chest tightness, shortness of breath and wheezing.   Cardiovascular: Negative for leg swelling.  Neurological: Negative for headaches.   all other systems are reviewed and negative except as listed in history of present illness above     Objective:   Physical Exam  BP 112/60  Pulse 64  Temp(Src) 97.5 F (36.4 C) (Oral)  Ht 5\' 10"  (1.778 m)  Wt 192 lb 6.4 oz (87.272 kg)  BMI 27.61 kg/m2  SpO2 95% Wt Readings from Last 3 Encounters:  04/02/11 192 lb 6.4 oz (87.272 kg)  09/27/10 195 lb (88.451 kg)  08/09/10 193 lb 6.4 oz (87.726 kg)   Constitutional: He appears well-developed and well-nourished. No distress. Son at side HENT: NCAT, B TMs ok without redness or effusion; No sinus tenderness; OP clear Neck: Normal range of motion. Neck supple. No JVD present. No thyromegaly present.  Cardiovascular: Normal rate, regular rhythm and normal  heart sounds.  No murmur heard. no BLE edema Pulmonary/Chest: Effort normal -breath sounds with few rhinchi. No respiratory distress. no wheezes.  Psychiatric: he has a normal mood and affect. behavior is normal. Judgment and thought content normal.   Lab Results  Component Value Date   WBC 5.9 09/27/2010   HGB 14.9 09/27/2010   HCT 43.8 09/27/2010   PLT 134.0* 09/27/2010   CHOL 155  09/27/2010   TRIG 184.0* 09/27/2010   HDL 28.80* 09/27/2010   ALT 25 09/27/2010   AST 19 09/27/2010   NA 139 09/27/2010   K 4.1 09/27/2010   CL 103 09/27/2010   CREATININE 1.3 09/27/2010   BUN 17 09/27/2010   CO2 29 09/27/2010   TSH 1.41 09/27/2010   PSA 0.9 12/23/2008   HGBA1C 6.0 09/27/2010   MICROALBUR 0.4 04/16/2010        Assessment & Plan:  AWV - cpx - v70.0 - Today patient counseled on age appropriate routine health concerns for screening and prevention, each reviewed and up to date or declined. Immunizations reviewed and up to date or declined. Labs reviewed. Risk factors for depression reviewed and negative. Hearing function intact and visual acuity stable - followed by optho for same. ADLs screened and addressed as needed. Functional ability and level of safety reviewed and appropriate. Education, counseling and referrals performed based on assessed risks today. Patient provided with a copy of personalized plan for preventive services.  Also see problem list. Medications and labs reviewed today.

## 2011-04-02 NOTE — Assessment & Plan Note (Signed)
Well controlled by hx - check a1c now The current medical regimen is effective;  continue present plan and medications.  Lab Results  Component Value Date   HGBA1C 6.0 09/27/2010

## 2011-05-07 ENCOUNTER — Other Ambulatory Visit: Payer: Self-pay | Admitting: Internal Medicine

## 2011-05-15 DIAGNOSIS — H4011X0 Primary open-angle glaucoma, stage unspecified: Secondary | ICD-10-CM | POA: Diagnosis not present

## 2011-05-15 DIAGNOSIS — H409 Unspecified glaucoma: Secondary | ICD-10-CM | POA: Diagnosis not present

## 2011-05-15 DIAGNOSIS — H353190 Nonexudative age-related macular degeneration, unspecified eye, stage unspecified: Secondary | ICD-10-CM | POA: Diagnosis not present

## 2011-06-18 ENCOUNTER — Other Ambulatory Visit: Payer: Self-pay | Admitting: Internal Medicine

## 2011-07-13 ENCOUNTER — Other Ambulatory Visit: Payer: Self-pay | Admitting: Internal Medicine

## 2011-08-16 DIAGNOSIS — Z760 Encounter for issue of repeat prescription: Secondary | ICD-10-CM | POA: Diagnosis not present

## 2011-08-16 DIAGNOSIS — Z961 Presence of intraocular lens: Secondary | ICD-10-CM | POA: Diagnosis not present

## 2011-08-16 DIAGNOSIS — H4089 Other specified glaucoma: Secondary | ICD-10-CM | POA: Diagnosis not present

## 2011-08-16 DIAGNOSIS — H353290 Exudative age-related macular degeneration, unspecified eye, stage unspecified: Secondary | ICD-10-CM | POA: Diagnosis not present

## 2011-08-16 DIAGNOSIS — H409 Unspecified glaucoma: Secondary | ICD-10-CM | POA: Diagnosis not present

## 2011-08-16 DIAGNOSIS — H4011X0 Primary open-angle glaucoma, stage unspecified: Secondary | ICD-10-CM | POA: Diagnosis not present

## 2011-08-19 DIAGNOSIS — H409 Unspecified glaucoma: Secondary | ICD-10-CM | POA: Diagnosis not present

## 2011-08-19 DIAGNOSIS — H353290 Exudative age-related macular degeneration, unspecified eye, stage unspecified: Secondary | ICD-10-CM | POA: Diagnosis not present

## 2011-08-19 DIAGNOSIS — H4089 Other specified glaucoma: Secondary | ICD-10-CM | POA: Diagnosis not present

## 2011-08-19 DIAGNOSIS — H4011X0 Primary open-angle glaucoma, stage unspecified: Secondary | ICD-10-CM | POA: Diagnosis not present

## 2011-09-06 DIAGNOSIS — C44319 Basal cell carcinoma of skin of other parts of face: Secondary | ICD-10-CM | POA: Diagnosis not present

## 2011-09-06 DIAGNOSIS — L570 Actinic keratosis: Secondary | ICD-10-CM | POA: Diagnosis not present

## 2011-09-06 DIAGNOSIS — L821 Other seborrheic keratosis: Secondary | ICD-10-CM | POA: Diagnosis not present

## 2011-09-06 DIAGNOSIS — D485 Neoplasm of uncertain behavior of skin: Secondary | ICD-10-CM | POA: Diagnosis not present

## 2011-09-19 DIAGNOSIS — H409 Unspecified glaucoma: Secondary | ICD-10-CM | POA: Diagnosis not present

## 2011-09-19 DIAGNOSIS — H4089 Other specified glaucoma: Secondary | ICD-10-CM | POA: Diagnosis not present

## 2011-10-01 ENCOUNTER — Other Ambulatory Visit (INDEPENDENT_AMBULATORY_CARE_PROVIDER_SITE_OTHER): Payer: Medicare Other

## 2011-10-01 ENCOUNTER — Encounter: Payer: Self-pay | Admitting: Internal Medicine

## 2011-10-01 ENCOUNTER — Ambulatory Visit (INDEPENDENT_AMBULATORY_CARE_PROVIDER_SITE_OTHER): Payer: Medicare Other | Admitting: Internal Medicine

## 2011-10-01 VITALS — BP 110/62 | HR 67 | Temp 97.6°F | Ht 70.0 in | Wt 190.1 lb

## 2011-10-01 DIAGNOSIS — R5383 Other fatigue: Secondary | ICD-10-CM

## 2011-10-01 DIAGNOSIS — K219 Gastro-esophageal reflux disease without esophagitis: Secondary | ICD-10-CM

## 2011-10-01 DIAGNOSIS — G2581 Restless legs syndrome: Secondary | ICD-10-CM | POA: Insufficient documentation

## 2011-10-01 DIAGNOSIS — I10 Essential (primary) hypertension: Secondary | ICD-10-CM

## 2011-10-01 DIAGNOSIS — E119 Type 2 diabetes mellitus without complications: Secondary | ICD-10-CM | POA: Diagnosis not present

## 2011-10-01 DIAGNOSIS — R5381 Other malaise: Secondary | ICD-10-CM

## 2011-10-01 LAB — HEPATIC FUNCTION PANEL
AST: 31 U/L (ref 0–37)
Albumin: 4 g/dL (ref 3.5–5.2)
Alkaline Phosphatase: 47 U/L (ref 39–117)
Bilirubin, Direct: 0.1 mg/dL (ref 0.0–0.3)

## 2011-10-01 LAB — TSH: TSH: 2.36 u[IU]/mL (ref 0.35–5.50)

## 2011-10-01 LAB — CBC WITH DIFFERENTIAL/PLATELET
Basophils Relative: 0.4 % (ref 0.0–3.0)
Eosinophils Absolute: 0.1 10*3/uL (ref 0.0–0.7)
Eosinophils Relative: 2.7 % (ref 0.0–5.0)
Lymphocytes Relative: 30.5 % (ref 12.0–46.0)
Neutrophils Relative %: 55.1 % (ref 43.0–77.0)
RBC: 4.58 Mil/uL (ref 4.22–5.81)
WBC: 5.6 10*3/uL (ref 4.5–10.5)

## 2011-10-01 LAB — BASIC METABOLIC PANEL
CO2: 24 mEq/L (ref 19–32)
GFR: 55.16 mL/min — ABNORMAL LOW (ref >60.00)
Glucose, Bld: 136 mg/dL — ABNORMAL HIGH (ref 70–99)
Potassium: 4.1 mEq/L (ref 3.5–5.1)
Sodium: 136 mEq/L (ref 135–145)

## 2011-10-01 LAB — LIPID PANEL
Cholesterol: 151 mg/dL (ref 0–200)
HDL: 31.4 mg/dL — ABNORMAL LOW (ref >39.00)
VLDL: 34.4 mg/dL (ref 0.0–40.0)

## 2011-10-01 LAB — HEMOGLOBIN A1C: Hgb A1c MFr Bld: 5.8 % (ref 4.6–6.5)

## 2011-10-01 MED ORDER — CLONAZEPAM 0.5 MG PO TABS: 0.5000 mg | ORAL_TABLET | Freq: Every evening | ORAL | Status: DC | PRN

## 2011-10-01 MED ORDER — OMEPRAZOLE 20 MG PO CPDR: 20.0000 mg | DELAYED_RELEASE_CAPSULE | Freq: Every day | ORAL | Status: DC

## 2011-10-01 NOTE — Patient Instructions (Signed)
It was good to see you today. We have reviewed your interval medical history today Test(s) ordered today. Your results will be called to you after review (48-72hours after test completion). If any changes need to be made, you will be notified at that time. Medications reviewed, stop Zantac and start Prilosec (generic) for reflux - no other changes at this time. Your prescription(s) and refills have been submitted to your pharmacy. Please take as directed and contact our office if you believe you are having problem(s) with the medication(s). Please schedule followup in 6 months for diabetes mellitus check, call sooner if problems.

## 2011-10-01 NOTE — Assessment & Plan Note (Signed)
Well controlled by hx - check a1c and lipids today On ACEI, last LDL<100 - q6-47mo eye exams with optho The current medical regimen is effective;  continue present plan and medications.  Lab Results  Component Value Date   HGBA1C 6.0 04/02/2011

## 2011-10-01 NOTE — Assessment & Plan Note (Signed)
BP Readings from Last 3 Encounters:  10/01/11 110/62  04/02/11 112/60  09/27/10 118/62   The current medical regimen is effective;  continue present plan and medications. On ACEI

## 2011-10-01 NOTE — Assessment & Plan Note (Signed)
Change H2B to PPI Check CBC

## 2011-10-01 NOTE — Assessment & Plan Note (Signed)
Nocturnal symptoms controlled with Klonopin - refill and continue same

## 2011-10-01 NOTE — Progress Notes (Signed)
Subjective:    Patient ID: Timothy Snyder, male    DOB: 05-27-1927, 78 y.o.   MRN: [id number]  HPI   Here for 6 month follow up - reviewed chronic medical issues  DM2 - reports compliance with ongoing medical treatment and no changes in medication dose or frequency. denies adverse side effects related to current therapy. no signs or symptoms hypoglycemia - checks sugars infreq due to difficulty seeing blood drop after fingerstick -   HTN - reports compliance with ongoing medical treatment and no changes in medication dose or frequency. denies adverse side effects related to current therapy. no chest pain, headache or edema   BPH - reports compliance with ongoing medical treatment and no changes in medication dose or frequency. denies adverse side effects related to current therapy. no dizziness or orthostatc symptoms - no dysuria or hematuria or incontinence   Eye problems: glaucoma and macular degeneration - legally blind and does not drive due to same - follows with local optho   RLS - use klonopin at bedtime for control of same -   GERD - uses OTC H2B daily but still with episodic flares of indigestion 2-3x/week - no unexpected weight loss, severe SSCP or change in bowels  Past Medical History  Diagnosis Date  . Actinic keratosis   . ARTHRITIS   . Macular degeneration     L>R vision loss, legally blind  . Restless leg syndrome   . BPH (benign prostatic hyperplasia)   . HYPERTENSION   . GLUCOMA   . GERD (gastroesophageal reflux disease)   . DIABETES MELLITUS, TYPE II     Review of Systems  Constitutional: Positive for fatigue. Negative for fever.  HENT: Negative for rhinorrhea and sinus pressure.   Respiratory: Negative for chest tightness, shortness of breath and wheezing.   Cardiovascular: Negative for leg swelling.  Neurological: Negative for headaches.       Objective:   Physical Exam  BP 110/62  Pulse 67  Temp 97.6 F (36.4 C) (Oral)  Ht 5\' 10"  (1.778 m)  Wt  190 lb 1.9 oz (86.238 kg)  BMI 27.28 kg/m2  SpO2 97% Wt Readings from Last 3 Encounters:  10/01/11 190 lb 1.9 oz (86.238 kg)  04/02/11 192 lb 6.4 oz (87.272 kg)  09/27/10 195 lb (88.451 kg)   Constitutional: He appears well-developed and well-nourished. No distress. Son at side HENT: NCAT, B TMs ok without redness or effusion; No sinus tenderness; OP clear Neck: Normal range of motion. Neck supple. No JVD present. No thyromegaly present.  Cardiovascular: Normal rate, regular rhythm and normal heart sounds.  No murmur heard. no BLE edema Abdomen: SNTND +BS Pulmonary/Chest: Effort normal -breath sounds with few rhinchi. No respiratory distress. no wheezes.  Psychiatric: he has a normal mood and affect. behavior is normal. Judgment and thought content normal.   Lab Results  Component Value Date   WBC 5.9 09/27/2010   HGB 14.9 09/27/2010   HCT 43.8 09/27/2010   PLT 134.0* 09/27/2010   CHOL 155 09/27/2010   TRIG 184.0* 09/27/2010   HDL 28.80* 09/27/2010   ALT 25 09/27/2010   AST 19 09/27/2010   NA 139 09/27/2010   K 4.1 09/27/2010   CL 103 09/27/2010   CREATININE 1.3 09/27/2010   BUN 17 09/27/2010   CO2 29 09/27/2010   TSH 1.41 09/27/2010   PSA 0.9 12/23/2008   HGBA1C 6.0 04/02/2011   MICROALBUR 0.4 04/16/2010        Assessment & Plan:  see  problem list. Medications and labs reviewed today.

## 2011-10-07 DIAGNOSIS — H4089 Other specified glaucoma: Secondary | ICD-10-CM | POA: Diagnosis not present

## 2011-10-07 DIAGNOSIS — H409 Unspecified glaucoma: Secondary | ICD-10-CM | POA: Diagnosis not present

## 2011-10-15 DIAGNOSIS — H4011X0 Primary open-angle glaucoma, stage unspecified: Secondary | ICD-10-CM | POA: Diagnosis not present

## 2011-10-15 DIAGNOSIS — H4089 Other specified glaucoma: Secondary | ICD-10-CM | POA: Diagnosis not present

## 2011-10-15 DIAGNOSIS — H409 Unspecified glaucoma: Secondary | ICD-10-CM | POA: Diagnosis not present

## 2011-10-15 DIAGNOSIS — H2510 Age-related nuclear cataract, unspecified eye: Secondary | ICD-10-CM | POA: Diagnosis not present

## 2011-10-15 DIAGNOSIS — H353290 Exudative age-related macular degeneration, unspecified eye, stage unspecified: Secondary | ICD-10-CM | POA: Diagnosis not present

## 2011-10-23 DIAGNOSIS — C44211 Basal cell carcinoma of skin of unspecified ear and external auricular canal: Secondary | ICD-10-CM | POA: Diagnosis not present

## 2011-11-12 DIAGNOSIS — S01309A Unspecified open wound of unspecified ear, initial encounter: Secondary | ICD-10-CM | POA: Diagnosis not present

## 2011-11-14 ENCOUNTER — Other Ambulatory Visit: Payer: Self-pay | Admitting: Internal Medicine

## 2011-11-15 ENCOUNTER — Other Ambulatory Visit: Payer: Self-pay | Admitting: General Practice

## 2011-11-15 MED ORDER — GLIMEPIRIDE 2 MG PO TABS: 2.0000 mg | ORAL_TABLET | Freq: Every day | ORAL | Status: DC

## 2011-11-27 DIAGNOSIS — Z23 Encounter for immunization: Secondary | ICD-10-CM | POA: Diagnosis not present

## 2011-12-18 DIAGNOSIS — H353290 Exudative age-related macular degeneration, unspecified eye, stage unspecified: Secondary | ICD-10-CM | POA: Diagnosis not present

## 2011-12-18 DIAGNOSIS — H4089 Other specified glaucoma: Secondary | ICD-10-CM | POA: Diagnosis not present

## 2011-12-18 DIAGNOSIS — H409 Unspecified glaucoma: Secondary | ICD-10-CM | POA: Diagnosis not present

## 2011-12-20 ENCOUNTER — Other Ambulatory Visit: Payer: Self-pay | Admitting: Internal Medicine

## 2011-12-30 DIAGNOSIS — H353290 Exudative age-related macular degeneration, unspecified eye, stage unspecified: Secondary | ICD-10-CM | POA: Diagnosis not present

## 2011-12-30 DIAGNOSIS — H409 Unspecified glaucoma: Secondary | ICD-10-CM | POA: Diagnosis not present

## 2011-12-30 DIAGNOSIS — E1139 Type 2 diabetes mellitus with other diabetic ophthalmic complication: Secondary | ICD-10-CM | POA: Diagnosis not present

## 2011-12-30 DIAGNOSIS — H4011X0 Primary open-angle glaucoma, stage unspecified: Secondary | ICD-10-CM | POA: Diagnosis not present

## 2011-12-30 DIAGNOSIS — E11359 Type 2 diabetes mellitus with proliferative diabetic retinopathy without macular edema: Secondary | ICD-10-CM | POA: Diagnosis not present

## 2011-12-30 DIAGNOSIS — H4089 Other specified glaucoma: Secondary | ICD-10-CM | POA: Diagnosis not present

## 2011-12-30 DIAGNOSIS — H211X9 Other vascular disorders of iris and ciliary body, unspecified eye: Secondary | ICD-10-CM | POA: Diagnosis not present

## 2012-02-10 DIAGNOSIS — Z01810 Encounter for preprocedural cardiovascular examination: Secondary | ICD-10-CM | POA: Diagnosis not present

## 2012-02-10 DIAGNOSIS — I440 Atrioventricular block, first degree: Secondary | ICD-10-CM | POA: Diagnosis not present

## 2012-02-10 DIAGNOSIS — H269 Unspecified cataract: Secondary | ICD-10-CM | POA: Diagnosis not present

## 2012-02-10 DIAGNOSIS — I10 Essential (primary) hypertension: Secondary | ICD-10-CM | POA: Diagnosis not present

## 2012-02-15 ENCOUNTER — Other Ambulatory Visit: Payer: Self-pay | Admitting: Internal Medicine

## 2012-02-25 DIAGNOSIS — Z87891 Personal history of nicotine dependence: Secondary | ICD-10-CM | POA: Diagnosis not present

## 2012-02-25 DIAGNOSIS — H4011X0 Primary open-angle glaucoma, stage unspecified: Secondary | ICD-10-CM | POA: Diagnosis not present

## 2012-02-25 DIAGNOSIS — I10 Essential (primary) hypertension: Secondary | ICD-10-CM | POA: Diagnosis not present

## 2012-02-25 DIAGNOSIS — Z9849 Cataract extraction status, unspecified eye: Secondary | ICD-10-CM | POA: Diagnosis not present

## 2012-02-25 DIAGNOSIS — H3530 Unspecified macular degeneration: Secondary | ICD-10-CM | POA: Diagnosis not present

## 2012-02-25 DIAGNOSIS — E119 Type 2 diabetes mellitus without complications: Secondary | ICD-10-CM | POA: Diagnosis not present

## 2012-02-25 DIAGNOSIS — F411 Generalized anxiety disorder: Secondary | ICD-10-CM | POA: Diagnosis not present

## 2012-02-25 DIAGNOSIS — H2510 Age-related nuclear cataract, unspecified eye: Secondary | ICD-10-CM | POA: Diagnosis not present

## 2012-02-25 DIAGNOSIS — H409 Unspecified glaucoma: Secondary | ICD-10-CM | POA: Diagnosis not present

## 2012-03-23 ENCOUNTER — Other Ambulatory Visit: Payer: Self-pay | Admitting: Internal Medicine

## 2012-03-25 DIAGNOSIS — L570 Actinic keratosis: Secondary | ICD-10-CM | POA: Diagnosis not present

## 2012-03-25 DIAGNOSIS — L259 Unspecified contact dermatitis, unspecified cause: Secondary | ICD-10-CM | POA: Diagnosis not present

## 2012-03-25 DIAGNOSIS — D239 Other benign neoplasm of skin, unspecified: Secondary | ICD-10-CM | POA: Diagnosis not present

## 2012-03-25 DIAGNOSIS — Z85828 Personal history of other malignant neoplasm of skin: Secondary | ICD-10-CM | POA: Diagnosis not present

## 2012-03-26 ENCOUNTER — Encounter: Payer: Self-pay | Admitting: Internal Medicine

## 2012-03-26 DIAGNOSIS — Z0279 Encounter for issue of other medical certificate: Secondary | ICD-10-CM

## 2012-03-31 ENCOUNTER — Ambulatory Visit (INDEPENDENT_AMBULATORY_CARE_PROVIDER_SITE_OTHER): Payer: Medicare Other | Admitting: Internal Medicine

## 2012-03-31 ENCOUNTER — Other Ambulatory Visit (INDEPENDENT_AMBULATORY_CARE_PROVIDER_SITE_OTHER): Payer: Medicare Other

## 2012-03-31 ENCOUNTER — Encounter: Payer: Self-pay | Admitting: Internal Medicine

## 2012-03-31 VITALS — BP 118/62 | HR 61 | Temp 96.9°F | Ht 70.0 in | Wt 188.8 lb

## 2012-03-31 DIAGNOSIS — H6120 Impacted cerumen, unspecified ear: Secondary | ICD-10-CM

## 2012-03-31 DIAGNOSIS — N401 Enlarged prostate with lower urinary tract symptoms: Secondary | ICD-10-CM

## 2012-03-31 DIAGNOSIS — H6121 Impacted cerumen, right ear: Secondary | ICD-10-CM

## 2012-03-31 DIAGNOSIS — E119 Type 2 diabetes mellitus without complications: Secondary | ICD-10-CM

## 2012-03-31 MED ORDER — FINASTERIDE 5 MG PO TABS: 5.0000 mg | ORAL_TABLET | Freq: Every day | ORAL | Status: DC

## 2012-03-31 NOTE — Patient Instructions (Signed)
It was good to see you today. We have reviewed your interval medical history today Test(s) ordered today. Your results will be released to MyChart (or called to you) after review, usually within 72hours after test completion. If any changes need to be made, you will be notified at that same time. Medications reviewed, stop doxazocin and start finasteride (generic) for prostate symptoms - no other changes at this time. Your prescription(s) and refills have been submitted to your pharmacy. Please take as directed and contact our office if you believe you are having problem(s) with the medication(s). Please schedule followup in 6 months for physical, labs, and diabetes mellitus check, call sooner if problems. Your ear has been irrigated of wax today -let us know if continued hearing problems persist for referral to audiologist and hearing testing

## 2012-03-31 NOTE — Assessment & Plan Note (Signed)
doxazosin no longer on $4 formulary Change to finasteride - Check PSA now at baseline and repeat in 83mo Lab Results  Component Value Date   PSA 0.9 12/23/2008

## 2012-03-31 NOTE — Progress Notes (Signed)
Subjective:    Patient ID: Timothy Snyder, male    DOB: Jan 13, 1928, 79 y.o.   MRN: [id number]  HPI  Here for 6 month follow up - reviewed chronic medical issues  DM2 - reports compliance with ongoing medical treatment and no changes in medication dose or frequency. denies adverse side effects related to current therapy. no signs or symptoms hypoglycemia - checks sugars infreq due to difficulty seeing blood drop after fingerstick -   HTN - reports compliance with ongoing medical treatment and no changes in medication dose or frequency. denies adverse side effects related to current therapy. no chest pain, headache or edema   BPH - reports compliance with ongoing medical treatment and no changes in medication dose or frequency. denies adverse side effects related to current therapy. no dizziness or orthostatc symptoms - no dysuria or hematuria or incontinence   Eye problems: glaucoma and macular degeneration - legally blind and does not drive due to same - follows with local optho   RLS - use klonopin at bedtime for control of same -   GERD - uses OTC H2B daily but still with episodic flares of indigestion 2-3x/week - no unexpected weight loss, severe SSCP or change in bowels  Past Medical History  Diagnosis Date  . Actinic keratosis   . ARTHRITIS   . Macular degeneration     L>R vision loss, legally blind  . Restless leg syndrome   . BPH (benign prostatic hyperplasia)   . HYPERTENSION   . GLUCOMA   . GERD (gastroesophageal reflux disease)   . DIABETES MELLITUS, TYPE II     Review of Systems  Constitutional: Positive for fatigue. Negative for fever.  HENT: Negative for rhinorrhea and sinus pressure.   Respiratory: Negative for chest tightness, shortness of breath and wheezing.   Cardiovascular: Negative for leg swelling.  Neurological: Negative for headaches.       Objective:   Physical Exam  There were no vitals taken for this visit. Wt Readings from Last 3 Encounters:   10/01/11 190 lb 1.9 oz (86.238 kg)  04/02/11 192 lb 6.4 oz (87.272 kg)  09/27/10 195 lb (88.451 kg)   Constitutional: He appears well-developed and well-nourished. No distress. Son at side HENT: NCAT, L ear with cerumen, after irrigation, B TMs ok without redness or effusion; No sinus tenderness; OP clear Neck: Normal range of motion. Neck supple. No JVD present. No thyromegaly present.  Cardiovascular: Normal rate, regular rhythm and normal heart sounds.  No murmur heard. no BLE edema Abdomen: SNTND +BS Pulmonary/Chest: Effort normal -breath sounds clear without rhonchi or wheezes.  Psychiatric: he has a normal mood and affect. behavior is normal. Judgment and thought content normal.   Lab Results  Component Value Date   WBC 5.6 10/01/2011   HGB 13.9 10/01/2011   HCT 41.6 10/01/2011   PLT 134.0* 10/01/2011   CHOL 151 10/01/2011   TRIG 172.0* 10/01/2011   HDL 31.40* 10/01/2011   ALT 28 10/01/2011   AST 31 10/01/2011   NA 136 10/01/2011   K 4.1 10/01/2011   CL 104 10/01/2011   CREATININE 1.3 10/01/2011   BUN 15 10/01/2011   CO2 24 10/01/2011   TSH 2.36 10/01/2011   PSA 0.9 12/23/2008   HGBA1C 5.8 10/01/2011   MICROALBUR 0.4 04/16/2010   Procedure: wax removal Reason: wax impaction Risks and benefits of procedure discussed with the patient who agrees to proceed. Ear(s) irrigated with warm water. Large amount of wax removed. Instrumentation with metal ear  loop was performed to accomplish wax removal. the patient tolerated procedure well.      Assessment & Plan:  see problem list. Medications and labs reviewed today.   Cerumen impaction, L ear - irrigation today - improved

## 2012-03-31 NOTE — Assessment & Plan Note (Signed)
Well controlled by hx - check a1c q72mo to monitor On ACEI, last LDL<100 - q6-87mo eye exams with optho The current medical regimen is effective;  continue present plan and medications.  Lab Results  Component Value Date   HGBA1C 5.8 10/01/2011

## 2012-04-04 ENCOUNTER — Other Ambulatory Visit: Payer: Self-pay | Admitting: Internal Medicine

## 2012-04-06 NOTE — Telephone Encounter (Signed)
Faxed script back to [facility].../lmb 

## 2012-04-16 DIAGNOSIS — H31019 Macula scars of posterior pole (postinflammatory) (post-traumatic), unspecified eye: Secondary | ICD-10-CM | POA: Diagnosis not present

## 2012-04-16 DIAGNOSIS — H3410 Central retinal artery occlusion, unspecified eye: Secondary | ICD-10-CM | POA: Diagnosis not present

## 2012-04-16 DIAGNOSIS — H409 Unspecified glaucoma: Secondary | ICD-10-CM | POA: Diagnosis not present

## 2012-04-16 DIAGNOSIS — H4089 Other specified glaucoma: Secondary | ICD-10-CM | POA: Diagnosis not present

## 2012-05-04 ENCOUNTER — Other Ambulatory Visit: Payer: Self-pay | Admitting: *Deleted

## 2012-05-04 ENCOUNTER — Encounter: Payer: Self-pay | Admitting: Internal Medicine

## 2012-05-04 MED ORDER — PRODIGY LANCETS 26G MISC: 1.0000 | Freq: Every day | Status: AC

## 2012-05-04 MED ORDER — GLUCOSE BLOOD VI STRP: 1.0000 | ORAL_STRIP | Freq: Every day | Status: AC

## 2012-05-04 MED ORDER — PRODIGY LANCETS 26G MISC: 1.0000 | Freq: Two times a day (BID) | Status: DC

## 2012-05-25 ENCOUNTER — Encounter: Payer: Self-pay | Admitting: Internal Medicine

## 2012-05-25 MED ORDER — FINASTERIDE 5 MG PO TABS: 5.0000 mg | ORAL_TABLET | Freq: Every day | ORAL | Status: DC

## 2012-07-07 ENCOUNTER — Other Ambulatory Visit: Payer: Self-pay | Admitting: Internal Medicine

## 2012-07-18 ENCOUNTER — Other Ambulatory Visit: Payer: Self-pay | Admitting: Internal Medicine

## 2012-08-24 DIAGNOSIS — H4089 Other specified glaucoma: Secondary | ICD-10-CM | POA: Diagnosis not present

## 2012-08-24 DIAGNOSIS — H353290 Exudative age-related macular degeneration, unspecified eye, stage unspecified: Secondary | ICD-10-CM | POA: Diagnosis not present

## 2012-08-24 DIAGNOSIS — H4011X0 Primary open-angle glaucoma, stage unspecified: Secondary | ICD-10-CM | POA: Diagnosis not present

## 2012-08-24 DIAGNOSIS — H211X9 Other vascular disorders of iris and ciliary body, unspecified eye: Secondary | ICD-10-CM | POA: Diagnosis not present

## 2012-08-24 DIAGNOSIS — H409 Unspecified glaucoma: Secondary | ICD-10-CM | POA: Diagnosis not present

## 2012-09-29 ENCOUNTER — Ambulatory Visit (INDEPENDENT_AMBULATORY_CARE_PROVIDER_SITE_OTHER): Payer: Medicare Other | Admitting: Internal Medicine

## 2012-09-29 ENCOUNTER — Other Ambulatory Visit (INDEPENDENT_AMBULATORY_CARE_PROVIDER_SITE_OTHER): Payer: Medicare Other

## 2012-09-29 ENCOUNTER — Encounter: Payer: Self-pay | Admitting: Internal Medicine

## 2012-09-29 VITALS — BP 110/62 | HR 61 | Temp 96.9°F | Wt 191.6 lb

## 2012-09-29 DIAGNOSIS — E119 Type 2 diabetes mellitus without complications: Secondary | ICD-10-CM

## 2012-09-29 DIAGNOSIS — I10 Essential (primary) hypertension: Secondary | ICD-10-CM

## 2012-09-29 DIAGNOSIS — Z0000 Encounter for general adult medical examination without abnormal findings: Secondary | ICD-10-CM | POA: Diagnosis not present

## 2012-09-29 LAB — LIPID PANEL
Cholesterol: 148 mg/dL (ref 0–200)
Total CHOL/HDL Ratio: 6

## 2012-09-29 LAB — MICROALBUMIN / CREATININE URINE RATIO
Creatinine,U: 113.2 mg/dL
Microalb Creat Ratio: 0.2 mg/g (ref 0.0–30.0)
Microalb, Ur: 0.2 mg/dL (ref 0.0–1.9)

## 2012-09-29 LAB — HEMOGLOBIN A1C: Hgb A1c MFr Bld: 6.6 % — ABNORMAL HIGH (ref 4.6–6.5)

## 2012-09-29 NOTE — Assessment & Plan Note (Signed)
Well controlled by hx - check a1c q22mo to monitor On ACEI, last LDL<100 - q6-36mo eye exams with optho The current medical regimen is effective;  continue present plan and medications.  Lab Results  Component Value Date   HGBA1C 5.9 03/31/2012

## 2012-09-29 NOTE — Patient Instructions (Signed)
It was good to see you today. We have reviewed your prior records including labs and tests today Health Maintenance reviewed - Consider the shingles vaccine and get the flu shot each fall - all other recommended immunizations and age-appropriate screenings are up-to-date. Test(s) ordered today. Your results will be released to MyChart (or called to you) after review, usually within 72hours after test completion. If any changes need to be made, you will be notified at that same time. Medications reviewed and updated, no changes recommended at this time. Please schedule followup in 6 months, call sooner if problems.

## 2012-09-29 NOTE — Progress Notes (Signed)
Subjective:    Patient ID: Timothy Snyder, male    DOB: 1928-02-04, 79 y.o.   MRN: [id number]  HPI  Here for medicare wellness  Diet: heart healthy and diabetic Physical activity: sedentary Depression/mood screen: negative Hearing: intact to whispered voice Visual acuity: grossly impaired, performs semi-annual eye exam  ADLs: capable Fall risk: min-mod due to vision impairment Home safety: good Cognitive evaluation: intact to orientation, naming, recall and repetition EOL planning: adv directives, full code/ I agree  I have personally reviewed and have noted 1. The patient's medical and social history 2. Their use of alcohol, tobacco or illicit drugs 3. Their current medications and supplements 4. The patient's functional ability including ADL's, fall risks, home safety risks and hearing or visual impairment. 5. Diet and physical activities 6. Evidence for depression or mood disorders  Also here for 6 month follow up - reviewed chronic medical issues  DM2 - reports compliance with ongoing medical treatment and no changes in medication dose or frequency. denies adverse side effects related to current therapy. no signs or symptoms hypoglycemia - checks sugars infreq due to difficulty seeing blood drop after fingerstick -   HTN - reports compliance with ongoing medical treatment and no changes in medication dose or frequency. denies adverse side effects related to current therapy. no chest pain, headache or edema   BPH - reports compliance with ongoing medical treatment and no changes in medication dose or frequency. denies adverse side effects related to current therapy. no dizziness or orthostatc symptoms - no dysuria or hematuria or incontinence   Eye problems: glaucoma and macular degeneration - legally blind and does not drive due to same - follows with local optho   RLS - use klonopin at bedtime for control of same -   GERD - uses OTC H2B daily but still with episodic flares  of indigestion 2-3x/week - no unexpected weight loss, severe SSCP or change in bowels  Past Medical History  Diagnosis Date  . Actinic keratosis   . ARTHRITIS   . Macular degeneration     L>R vision loss, legally blind  . Restless leg syndrome   . BPH (benign prostatic hyperplasia)   . HYPERTENSION   . GLUCOMA   . GERD (gastroesophageal reflux disease)   . DIABETES MELLITUS, TYPE II    No family history on file. History  Substance Use Topics  . Smoking status: Never Smoker   . Smokeless tobacco: Not on file     Comment: Single, widowed 06/2006. Moved to [facility] from [place] 06/2009 to be near son/dtr. Lives alone & no driving, legally blind due to macular degeneration  . Alcohol Use: No    Review of Systems  Constitutional: Positive for fatigue. Negative for fever.  HENT: Negative for rhinorrhea and sinus pressure.   Respiratory: Negative for chest tightness, shortness of breath and wheezing.   Cardiovascular: Negative for leg swelling.  Neurological: Negative for headaches.  No other specific complaints in a complete review of systems (except as listed in HPI above).      Objective:   Physical Exam  BP 110/62  Pulse 61  Temp(Src) 96.9 F (36.1 C) (Oral)  Wt 191 lb 9.6 oz (86.909 kg)  BMI 27.49 kg/m2  SpO2 95% Wt Readings from Last 3 Encounters:  09/29/12 191 lb 9.6 oz (86.909 kg)  03/31/12 188 lb 12.8 oz (85.639 kg)  10/01/11 190 lb 1.9 oz (86.238 kg)   Constitutional: He appears well-developed and well-nourished. No distress. Son at side HENT:  NCAT. Hearing aides B, reviewed: B TMs ok without redness or effusion; No sinus tenderness; OP clear Neck: Normal range of motion. Neck supple. No JVD present. No thyromegaly present.  Cardiovascular: Normal rate, regular rhythm and normal heart sounds.  No murmur heard. no BLE edema Pulmonary/Chest: Effort normal -breath sounds clear without rhonchi or wheezes.  Psychiatric: he has a normal mood and affect. behavior is  normal. Judgment and thought content normal.   Lab Results  Component Value Date   WBC 5.6 10/01/2011   HGB 13.9 10/01/2011   HCT 41.6 10/01/2011   PLT 134.0* 10/01/2011   CHOL 151 10/01/2011   TRIG 172.0* 10/01/2011   HDL 31.40* 10/01/2011   ALT 28 10/01/2011   AST 31 10/01/2011   NA 136 10/01/2011   K 4.1 10/01/2011   CL 104 10/01/2011   CREATININE 1.3 10/01/2011   BUN 15 10/01/2011   CO2 24 10/01/2011   TSH 2.36 10/01/2011   PSA 1.87 03/31/2012   HGBA1C 5.9 03/31/2012   MICROALBUR 0.4 04/16/2010        Assessment & Plan:  AWV/v70.0 - Today patient counseled on age appropriate routine health concerns for screening and prevention, each reviewed and up to date or declined. Immunizations reviewed and up to date or declined. Labs ordered & reviewed. Risk factors for depression reviewed and negative. Hearing function and visual acuity are reviewed. ADLs screened and addressed as needed. Functional ability and level of safety reviewed and appropriate. Education, counseling and referrals performed based on assessed risks today. Patient provided with a copy of personalized plan for preventive services.  Also see problem list. Medications and labs reviewed today.

## 2012-09-29 NOTE — Assessment & Plan Note (Signed)
BP Readings from Last 3 Encounters:  09/29/12 110/62  03/31/12 118/62  10/01/11 110/62   The current medical regimen is effective;  continue present plan and medications. On ACEI

## 2012-10-03 ENCOUNTER — Other Ambulatory Visit: Payer: Self-pay | Admitting: Internal Medicine

## 2012-10-05 NOTE — Telephone Encounter (Signed)
Faxed script back to [facility].../lmb 

## 2012-10-29 DIAGNOSIS — Z23 Encounter for immunization: Secondary | ICD-10-CM | POA: Diagnosis not present

## 2012-11-04 ENCOUNTER — Other Ambulatory Visit: Payer: Self-pay | Admitting: Internal Medicine

## 2012-11-05 ENCOUNTER — Other Ambulatory Visit: Payer: Self-pay | Admitting: Internal Medicine

## 2012-11-05 MED ORDER — FINASTERIDE 5 MG PO TABS: 5.0000 mg | ORAL_TABLET | Freq: Every day | ORAL | Status: DC

## 2012-11-05 NOTE — Addendum Note (Signed)
Addended by: Lyanne Co R on: 11/05/2012 08:21 AM   Modules accepted: Orders

## 2012-11-30 DIAGNOSIS — H211X9 Other vascular disorders of iris and ciliary body, unspecified eye: Secondary | ICD-10-CM | POA: Diagnosis not present

## 2012-11-30 DIAGNOSIS — H353290 Exudative age-related macular degeneration, unspecified eye, stage unspecified: Secondary | ICD-10-CM | POA: Diagnosis not present

## 2012-11-30 DIAGNOSIS — E11359 Type 2 diabetes mellitus with proliferative diabetic retinopathy without macular edema: Secondary | ICD-10-CM | POA: Diagnosis not present

## 2012-11-30 DIAGNOSIS — H409 Unspecified glaucoma: Secondary | ICD-10-CM | POA: Diagnosis not present

## 2012-11-30 DIAGNOSIS — H4089 Other specified glaucoma: Secondary | ICD-10-CM | POA: Diagnosis not present

## 2012-11-30 DIAGNOSIS — H4011X0 Primary open-angle glaucoma, stage unspecified: Secondary | ICD-10-CM | POA: Diagnosis not present

## 2012-12-24 ENCOUNTER — Other Ambulatory Visit: Payer: Self-pay

## 2012-12-28 ENCOUNTER — Other Ambulatory Visit: Payer: Self-pay | Admitting: Internal Medicine

## 2012-12-28 NOTE — Telephone Encounter (Signed)
Faxed script back to [facility].../lmb 

## 2013-01-04 DIAGNOSIS — E118 Type 2 diabetes mellitus with unspecified complications: Secondary | ICD-10-CM | POA: Diagnosis not present

## 2013-01-07 ENCOUNTER — Encounter: Payer: Self-pay | Admitting: Internal Medicine

## 2013-01-08 MED ORDER — DOXAZOSIN MESYLATE 2 MG PO TABS: 2.0000 mg | ORAL_TABLET | Freq: Every day | ORAL | Status: DC

## 2013-01-25 MED ORDER — TAMSULOSIN HCL 0.4 MG PO CAPS: 0.4000 mg | ORAL_CAPSULE | Freq: Every day | ORAL | Status: DC

## 2013-01-25 MED ORDER — DOXAZOSIN MESYLATE 8 MG PO TABS: 8.0000 mg | ORAL_TABLET | Freq: Every day | ORAL | Status: DC

## 2013-01-25 NOTE — Addendum Note (Signed)
Addended by: Rene Paci A on: 01/25/2013 05:05 PM   Modules accepted: Orders

## 2013-02-05 ENCOUNTER — Encounter: Payer: Self-pay | Admitting: Internal Medicine

## 2013-02-06 NOTE — Telephone Encounter (Signed)
Valentina Gu - please follow up on this as and let pt's son know - thanks

## 2013-02-08 NOTE — Telephone Encounter (Signed)
Not sure on paperwork. Forwarding msg to [facility] to follow-up and please call pt to inform of status...Raechel Chute

## 2013-02-10 ENCOUNTER — Encounter: Payer: Self-pay | Admitting: Internal Medicine

## 2013-02-10 ENCOUNTER — Other Ambulatory Visit: Payer: Self-pay | Admitting: Internal Medicine

## 2013-02-22 ENCOUNTER — Ambulatory Visit: Payer: Medicare Other | Admitting: Internal Medicine

## 2013-02-23 ENCOUNTER — Encounter: Payer: Self-pay | Admitting: Internal Medicine

## 2013-02-23 ENCOUNTER — Ambulatory Visit (INDEPENDENT_AMBULATORY_CARE_PROVIDER_SITE_OTHER): Payer: Medicare Other | Admitting: Internal Medicine

## 2013-02-23 ENCOUNTER — Other Ambulatory Visit (INDEPENDENT_AMBULATORY_CARE_PROVIDER_SITE_OTHER): Payer: Medicare Other

## 2013-02-23 VITALS — BP 132/70 | HR 76 | Temp 97.7°F | Ht 68.0 in | Wt 193.4 lb

## 2013-02-23 DIAGNOSIS — R143 Flatulence: Secondary | ICD-10-CM | POA: Diagnosis not present

## 2013-02-23 DIAGNOSIS — K219 Gastro-esophageal reflux disease without esophagitis: Secondary | ICD-10-CM | POA: Diagnosis not present

## 2013-02-23 DIAGNOSIS — R142 Eructation: Secondary | ICD-10-CM

## 2013-02-23 DIAGNOSIS — R140 Abdominal distension (gaseous): Secondary | ICD-10-CM

## 2013-02-23 DIAGNOSIS — R0789 Other chest pain: Secondary | ICD-10-CM | POA: Diagnosis not present

## 2013-02-23 DIAGNOSIS — E119 Type 2 diabetes mellitus without complications: Secondary | ICD-10-CM | POA: Diagnosis not present

## 2013-02-23 DIAGNOSIS — N138 Other obstructive and reflux uropathy: Secondary | ICD-10-CM

## 2013-02-23 DIAGNOSIS — R141 Gas pain: Secondary | ICD-10-CM

## 2013-02-23 DIAGNOSIS — N401 Enlarged prostate with lower urinary tract symptoms: Secondary | ICD-10-CM

## 2013-02-23 LAB — CBC WITH DIFFERENTIAL/PLATELET
Basophils Absolute: 0 10*3/uL (ref 0.0–0.1)
Basophils Relative: 0.4 % (ref 0.0–3.0)
EOS PCT: 1.9 % (ref 0.0–5.0)
Eosinophils Absolute: 0.2 10*3/uL (ref 0.0–0.7)
HEMATOCRIT: 44.7 % (ref 39.0–52.0)
Hemoglobin: 15.5 g/dL (ref 13.0–17.0)
LYMPHS ABS: 3.4 10*3/uL (ref 0.7–4.0)
Lymphocytes Relative: 38.3 % (ref 12.0–46.0)
MCHC: 34.7 g/dL (ref 30.0–36.0)
MCV: 88.7 fl (ref 78.0–100.0)
MONOS PCT: 10.7 % (ref 3.0–12.0)
Monocytes Absolute: 1 10*3/uL (ref 0.1–1.0)
Neutro Abs: 4.4 10*3/uL (ref 1.4–7.7)
Neutrophils Relative %: 48.7 % (ref 43.0–77.0)
Platelets: 207 10*3/uL (ref 150.0–400.0)
RBC: 5.04 Mil/uL (ref 4.22–5.81)
RDW: 13.5 % (ref 11.5–14.6)
WBC: 8.9 10*3/uL (ref 4.5–10.5)

## 2013-02-23 LAB — BASIC METABOLIC PANEL
BUN: 18 mg/dL (ref 6–23)
CO2: 28 mEq/L (ref 19–32)
Calcium: 9.2 mg/dL (ref 8.4–10.5)
Chloride: 103 mEq/L (ref 96–112)
Creatinine, Ser: 1.3 mg/dL (ref 0.4–1.5)
GFR: 58 mL/min — AB (ref >60.00)
Glucose, Bld: 97 mg/dL (ref 70–99)
POTASSIUM: 3.9 meq/L (ref 3.5–5.1)
Sodium: 139 mEq/L (ref 135–145)

## 2013-02-23 LAB — HEPATIC FUNCTION PANEL
ALT: 25 U/L (ref 0–53)
AST: 20 U/L (ref 0–37)
Albumin: 4.4 g/dL (ref 3.5–5.2)
Alkaline Phosphatase: 50 U/L (ref 39–117)
BILIRUBIN DIRECT: 0.2 mg/dL (ref 0.0–0.3)
BILIRUBIN TOTAL: 0.8 mg/dL (ref 0.3–1.2)
TOTAL PROTEIN: 7.3 g/dL (ref 6.0–8.3)

## 2013-02-23 LAB — HEMOGLOBIN A1C: Hgb A1c MFr Bld: 6.7 % — ABNORMAL HIGH (ref 4.6–6.5)

## 2013-02-23 LAB — LIPASE: Lipase: 32 U/L (ref 11.0–59.0)

## 2013-02-23 MED ORDER — SUCRALFATE 1 GM/10ML PO SUSP: 1.0000 g | Freq: Three times a day (TID) | ORAL | Status: DC

## 2013-02-23 MED ORDER — OMEPRAZOLE 40 MG PO CPDR: 40.0000 mg | DELAYED_RELEASE_CAPSULE | Freq: Every day | ORAL | Status: DC

## 2013-02-23 MED ORDER — DOXAZOSIN MESYLATE 2 MG PO TABS: 2.0000 mg | ORAL_TABLET | Freq: Every day | ORAL | Status: DC

## 2013-02-23 MED ORDER — TAMSULOSIN HCL 0.4 MG PO CAPS: 0.4000 mg | ORAL_CAPSULE | Freq: Every day | ORAL | Status: DC

## 2013-02-23 NOTE — Assessment & Plan Note (Signed)
Well controlled by hx - check a1c q62mo to monitor On ACEI, last LDL<100 - q6-15mo eye exams with optho The current medical regimen is effective;  continue present plan and medications.  Lab Results  Component Value Date   HGBA1C 6.6* 09/29/2012

## 2013-02-23 NOTE — Assessment & Plan Note (Signed)
Changed H2B to PPI 09/2011 due to GI symptoms - but no meds for same in past 6 mo Check CBC and resume rx dose PPI now

## 2013-02-23 NOTE — Assessment & Plan Note (Signed)
doxazosin no longer on $4 formulary Changed to finasteride 03/2102 but symptoms not completely controlled  Given additional Flomax which helps but is costly Will try low dose additive Cardura with finasteride in place of additive Flomax- ex done  Lab Results  Component Value Date   PSA 1.87 03/31/2012   PSA 0.9 12/23/2008

## 2013-02-23 NOTE — Patient Instructions (Addendum)
It was good to see you today.  We have reviewed your prior records including labs and tests today  Medications reviewed and updated  Resume Cardura at 2 mg daily in addition to ongoing finasteride for prostate. Okay to hold Flomax at this time  Increase prescription dose of omeprazole to 40 mg once daily for indigestion and heart burn symptoms Also start Carafate solution as directed for stomach symptoms  Your prescription(s) have been submitted to your pharmacy (90 day). Please take as directed and contact our office if you believe you are having problem(s) with the medication(s).  Test(s) ordered today. Your results will be released to [place] (or called to you) after review, usually within 72hours after test completion. If any changes need to be made, you will be notified at that same time.  Further treatment and evaluation will depend on results of lab testing and your response medication, please call sooner if problems or persisting pain in next 30 days  Bloating Bloating is the feeling of fullness in your belly. You may feel as though your pants are too tight. Often the cause of bloating is overeating, retaining fluids, or having gas in your bowel. It is also caused by swallowing air and eating foods that cause gas. Irritable bowel syndrome is one of the most common causes of bloating. Constipation is also a common cause. Sometimes more serious problems can cause bloating. SYMPTOMS  Usually there is a feeling of fullness, as though your abdomen is bulged out. There may be mild discomfort.  DIAGNOSIS  Usually no particular testing is necessary for most bloating. If the condition persists and seems to become worse, your caregiver may do additional testing.  TREATMENT   There is no direct treatment for bloating.  Do not put gas into the bowel. Avoid chewing gum and sucking on candy. These tend to make you swallow air. Swallowing air can also be a nervous habit. Try to avoid  this.  Avoiding high residue diets will help. Eat foods with soluble fibers (examples include root vegetables, apples, or barley) and substitute dairy products with soy and rice products. This helps irritable bowel syndrome.  If constipation is the cause, then a high residue diet with more fiber will help.  Avoid carbonated beverages.  Over-the-counter preparations are available that help reduce gas. Your pharmacist can help you with this. SEEK MEDICAL CARE IF:   Bloating continues and seems to be getting worse.  You notice a weight gain.  You have a weight loss but the bloating is getting worse.  You have changes in your bowel habits or develop nausea or vomiting. SEEK IMMEDIATE MEDICAL CARE IF:   You develop shortness of breath or swelling in your legs.  You have an increase in abdominal pain or develop chest pain. Document Released: 12/05/2005 Document Revised: 04/29/2011 Document Reviewed: 01/23/2007 [facility] Patient Information 2014 [place].

## 2013-02-23 NOTE — Progress Notes (Signed)
Subjective:    Patient ID: Timothy Snyder, male    DOB: January 31, 1928, 81 y.o.   MRN: [id number]  HPI  complains of severe "painful gas" 2 weeks ago x 2 episodes Located in lower substernal region and epigastric Episodes lasted 1-2 hours, resolved spontaneously Symptoms associated with chest tightness and general malaise - "didn't feel good or look good" per son  Symptoms nonexertional Symptoms not improved with rest, position change or efforts at regurgitation Denies association with vomiting, diarrhea or abdominal pain but potential low-grade fever during first episode No persisting chest pain Mild symptoms of bloating and flatulence with gas persists, but not painful  Past Medical History  Diagnosis Date  . Actinic keratosis   . ARTHRITIS   . Macular degeneration     L>R vision loss, legally blind  . Restless leg syndrome   . BPH (benign prostatic hyperplasia)   . HYPERTENSION   . GLUCOMA   . GERD (gastroesophageal reflux disease)   . DIABETES MELLITUS, TYPE II    No family history on file. History  Substance Use Topics  . Smoking status: Never Smoker   . Smokeless tobacco: Not on file     Comment: Single, widowed 06/2006. Moved to [facility] from [place] 06/2009 to be near son/dtr. Lives alone & no driving, legally blind due to macular degeneration  . Alcohol Use: No    Review of Systems  Constitutional: Negative for fatigue and unexpected weight change. Fever: ?LGF last week with bloat?  Respiratory: Positive for chest tightness. Negative for cough and shortness of breath.   Gastrointestinal: Positive for abdominal pain (mild) and constipation. Negative for nausea, vomiting and diarrhea.       "bloating" episodes x 2, severe 12/17 and 12/22  Genitourinary: Positive for urgency and decreased urine volume.  Neurological: Negative for dizziness and facial asymmetry.  All other systems reviewed and are negative.       Objective:   Physical Exam BP 132/70  Pulse 76   Temp(Src) 97.7 F (36.5 C) (Oral)  Ht 5\' 8"  (1.727 m)  Wt 193 lb 6.4 oz (87.726 kg)  BMI 29.41 kg/m2  SpO2 96% Wt Readings from Last 3 Encounters:  02/23/13 193 lb 6.4 oz (87.726 kg)  09/29/12 191 lb 9.6 oz (86.909 kg)  03/31/12 188 lb 12.8 oz (85.639 kg)   Constitutional: he appears well-developed and well-nourished. No distress. Son at side  Neck: Normal range of motion. Neck supple. No JVD present. No thyromegaly present.  Cardiovascular: Normal rate, regular rhythm and normal heart sounds.  No murmur heard. No BLE edema. Pulmonary/Chest: Effort normal and breath sounds normal. No respiratory distress. he has no wheezes.  Abdominal: Soft. Bowel sounds are normal. he exhibits no distension. There is no tenderness. no masses Skin: Skin is warm and dry. No rash noted. No erythema.  Psychiatric: he has a normal mood and affect. behavior is normal. Judgment and thought content normal.  Lab Results  Component Value Date   WBC 5.6 10/01/2011   HGB 13.9 10/01/2011   HCT 41.6 10/01/2011   PLT 134.0* 10/01/2011   GLUCOSE 136* 10/01/2011   CHOL 148 09/29/2012   TRIG 278.0* 09/29/2012   HDL 25.40* 09/29/2012   LDLDIRECT 93.1 09/29/2012   LDLCALC 85 10/01/2011   ALT 28 10/01/2011   AST 31 10/01/2011   NA 136 10/01/2011   K 4.1 10/01/2011   CL 104 10/01/2011   CREATININE 1.3 10/01/2011   BUN 15 10/01/2011   CO2 24 10/01/2011  TSH 2.36 10/01/2011   PSA 1.87 03/31/2012   HGBA1C 6.6* 09/29/2012   MICROALBUR 0.2 09/29/2012        Assessment & Plan:   Atypical chest pain Abdominal bloating and painful gas  ECG today without acute changes - but old Qs ant lt and inferior (no prior ECG to compare) Reports last stress test >27yr - will reorder now to exclude cardiac issue as cause of symptoms given age and number of CRF  Also treat for GERD and GI cause - resume omeprazole at 40mg  qd dose (prev on 20mg  but none in past 6 mo) and add carafate slurry as pt reports relief of ?ulcer with oral intake of  petroleum jelly (po prn)  Check labs  Consider [place] if continued symptoms and no lab or cardiac etiology identified  Also See problem list. Medications and labs reviewed today.

## 2013-02-23 NOTE — Progress Notes (Signed)
Pre-visit discussion using our clinic review tool. No additional management support is needed unless otherwise documented below in the visit note.  

## 2013-03-05 ENCOUNTER — Other Ambulatory Visit: Payer: Self-pay | Admitting: *Deleted

## 2013-03-05 MED ORDER — CLONAZEPAM 0.5 MG PO TABS: 0.5000 mg | ORAL_TABLET | Freq: Every day | ORAL | Status: DC

## 2013-03-05 NOTE — Telephone Encounter (Signed)
Faxed script back to [facility].../lmb 

## 2013-03-19 ENCOUNTER — Encounter: Payer: Self-pay | Admitting: Internal Medicine

## 2013-04-05 ENCOUNTER — Ambulatory Visit: Payer: Medicare Other | Admitting: Internal Medicine

## 2013-04-06 ENCOUNTER — Encounter: Payer: Self-pay | Admitting: Internal Medicine

## 2013-04-07 ENCOUNTER — Telehealth: Payer: Self-pay | Admitting: *Deleted

## 2013-04-07 NOTE — Telephone Encounter (Signed)
Sent email father is not taking Benadryl & need loratidine 10 mg bid added to med list.../lmb

## 2013-04-09 ENCOUNTER — Ambulatory Visit (INDEPENDENT_AMBULATORY_CARE_PROVIDER_SITE_OTHER): Payer: Medicare Other | Admitting: Physician Assistant

## 2013-04-09 DIAGNOSIS — R0789 Other chest pain: Secondary | ICD-10-CM | POA: Diagnosis not present

## 2013-04-09 DIAGNOSIS — E119 Type 2 diabetes mellitus without complications: Secondary | ICD-10-CM

## 2013-04-09 NOTE — Progress Notes (Signed)
Exercise Treadmill Test  Timothy Snyder is a 80 y.o. legally blind male with a hx of prior cigarette smoking, HTN, DM.  Referred by PCP for recent episodes of atypical chest pain. No syncope.  He has dyspnea with more moderate activities.  Exam unremarkable.  ECG demonstrates NSR, septal Q waves, no ST changes.    Pre-Exercise Testing Evaluation Rhythm: normal sinus  Rate: 71 bpm     Test  Exercise Tolerance Test Ordering MD: Sherren Mocha, MD  Interpreting MD: Richardson Dopp, PA-C  Unique Test No: 1  Treadmill:  1  Indication for ETT: chest pain - rule out ischemia  Contraindication to ETT: No   Stress Modality: exercise - treadmill  Cardiac Imaging Performed: non   Protocol: standard Bruce - maximal  Max BP:  171/58  Max MPHR (bpm):  137 85% MPR (bpm):  116  MPHR obtained (bpm):  121 % MPHR obtained:  88  Reached 85% MPHR (min:sec):  2:10 Total Exercise Time (min-sec):  3:00  Workload in METS:  4.6 Borg Scale: 15  Reason ETT Terminated:  desired heart rate attained    ST Segment Analysis At Rest: normal ST segments - no evidence of significant ST depression With Exercise: non-specific ST changes  Other Information Arrhythmia:  No Angina during ETT:  absent (0) Quality of ETT:  diagnostic  ETT Interpretation:  normal - no evidence of ischemia by ST analysis  Comments: Fair exercise capacity. No chest pain.  He did complain of dyspnea.  Normal BP response to exercise. Non-specific ST changes.  No significant ST changes to suggest ischemia.   Recommendations: F/u with Gwendolyn Grant, MD as directed. Signed,  Richardson Dopp, PA-C   04/09/2013 11:19 AM

## 2013-04-16 ENCOUNTER — Ambulatory Visit (INDEPENDENT_AMBULATORY_CARE_PROVIDER_SITE_OTHER): Payer: Medicare Other | Admitting: Internal Medicine

## 2013-04-16 ENCOUNTER — Encounter: Payer: Self-pay | Admitting: *Deleted

## 2013-04-16 ENCOUNTER — Encounter: Payer: Self-pay | Admitting: Internal Medicine

## 2013-04-16 VITALS — BP 118/52 | HR 70 | Temp 97.2°F | Wt 194.8 lb

## 2013-04-16 DIAGNOSIS — E119 Type 2 diabetes mellitus without complications: Secondary | ICD-10-CM

## 2013-04-16 DIAGNOSIS — I10 Essential (primary) hypertension: Secondary | ICD-10-CM | POA: Diagnosis not present

## 2013-04-16 DIAGNOSIS — K219 Gastro-esophageal reflux disease without esophagitis: Secondary | ICD-10-CM

## 2013-04-16 DIAGNOSIS — R0789 Other chest pain: Secondary | ICD-10-CM | POA: Diagnosis not present

## 2013-04-16 NOTE — Assessment & Plan Note (Signed)
BP Readings from Last 3 Encounters:  04/16/13 118/52  02/23/13 132/70  09/29/12 110/62   The current medical regimen is effective;  continue present plan and medications. On ACEI

## 2013-04-16 NOTE — Progress Notes (Signed)
Pre-visit discussion using our clinic review tool. No additional management support is needed unless otherwise documented below in the visit note.  

## 2013-04-16 NOTE — Assessment & Plan Note (Signed)
Well controlled by hx - check a1c q6mo to monitor On ACEI, last LDL<100 - q6-12mo eye exams with optho The current medical regimen is effective;  continue present plan and medications.  Lab Results  Component Value Date   HGBA1C 6.7* 02/23/2013   

## 2013-04-16 NOTE — Patient Instructions (Addendum)
It was good to see you today.  We have reviewed your prior records including labs and tests today  Medications reviewed and updated, no changes recommended at this time. Refill on medication(s) as discussed today.  Please schedule followup in 6 months, call sooner if problems.  Diabetes and Small Vessel Disease Small vessel disease (microvascular disease) includes nephropathy, retinopathy, and neuropathy. People with diabetes are at risk for these problems, but keeping blood glucose (sugar) controlled is helpful in preventing problems. DIABETIC KIDNEY PROBLEMS (DIABETIC NEPHROPATHY)  Diabetic nephropathy occurs in many patients with diabetes.  Damage to the small vessels in the kidneys is the leading cause of end-stage renal disease (ESRD).  Protein in the urine (albuminuria) in the range of 30 to 300 mg/24 h (microalbuminuria) is a sign of the earliest stage of diabetic nephropathy.  Good blood glucose (sugar) and blood pressure control significantly reduce the progression of nephropathy. DIABETIC EYE PROBLEMS (DIABETIC RETINOPATHY)  Diabetic retinopathy is the most common cause of new cases of blindness in adults. It is related to the number of years you have had diabetes.  Common risk factors include high blood sugar (hyperglycemia), high blood pressure (hypertension), and poorly controlled blood lipids such as high blood cholesterol (hypercholesterolemia). DIABETIC NERVE PROBLEMS (DIABETIC NEUROPATHY) Diabetic neuropathy is the most common, long-term complication of diabetes. It is responsible for more than half of leg amputations not due to accidents. The main risk for developing diabetic neuropathy seems to be uncontrolled blood sugars. Hyperglycemia damages the nerve fibers causing sensation (feeling) problems. The closer you can keep the following guidelines, the better chance you will have avoiding problems from small vessel disease.  Working toward near normal blood glucose or  as normal as possible. You will need to keep your blood glucose and A1c at the target range prescribed by your caregiver.  Keep your blood pressure less than 120/80.  Keep your low-density lipoprotein (LDL) cholesterol (one of the fats in your blood) at less than 100 mg/dL. An LDL less than 70 mg/dL may be recommended for high risk patients. You cannot change your family history, but it is important to change the risk factors that you can. Risk factors you can control include:  Controlling high blood pressure.  Stopping smoking.  Using alcohol only in moderation. Generally, this means about one drink per day for women and two drinks per day for men.  Controlling your blood lipids (cholesterol and triglycerides).  Treating heart problems, if these are contributing to risk. SEEK MEDICAL CARE IF:   You are having problems keeping your blood glucose in goal range.  You notice a change in your vision or new problems with your vision.  You have wound or sore that does not heal.  Your blood pressure is above the target range. Document Released: 02/07/2003 Document Revised: 01/22/2012 Document Reviewed: 07/15/2008 [facility] Patient Information 2014 [place], [place].

## 2013-04-16 NOTE — Progress Notes (Signed)
Subjective:    Patient ID: Timothy Snyder, male    DOB: 1927/12/20, 80 y.o.   MRN: [id number]  HPI Here for 6 month follow up -  Also reviewed chronic medical issues and interval issues  Atypical chest pain. Office visit for same given Duratuss 15 reviewed. No recurrence of pain following medication changes. Interval stress test reviewed, no ischemia  DM2 - reports compliance with ongoing medical treatment and no changes in medication dose or frequency. denies adverse side effects related to current therapy. no signs or symptoms hypoglycemia - checks sugars infreq due to difficulty seeing blood drop after fingerstick -   HTN - reports compliance with ongoing medical treatment and no changes in medication dose or frequency. denies adverse side effects related to current therapy. no chest pain, headache or edema   BPH - reports compliance with ongoing medical treatment and no changes in medication dose or frequency. denies adverse side effects related to current therapy. no dizziness or orthostatc symptoms - no dysuria or hematuria or incontinence   Eye problems: glaucoma and macular degeneration - legally blind and does not drive due to same - follows with local optho   RLS - use klonopin at bedtime for control of same -   GERD - changed to PPI qd and 30d carafate 02/2013 due to increased GI/"gas" symptoms - no unexpected weight loss, severe SSCP or change in bowels  Past Medical History  Diagnosis Date  . Actinic keratosis   . ARTHRITIS   . Macular degeneration     L>R vision loss, legally blind  . Restless leg syndrome   . BPH (benign prostatic hyperplasia)   . HYPERTENSION   . GLUCOMA   . GERD (gastroesophageal reflux disease)   . DIABETES MELLITUS, TYPE II     Review of Systems  Constitutional: Positive for fatigue. Negative for fever.  HENT: Negative for rhinorrhea and sinus pressure.   Respiratory: Negative for chest tightness, shortness of breath and wheezing.    Cardiovascular: Negative for leg swelling.  Neurological: Negative for headaches.       Objective:   Physical Exam BP 118/52  Pulse 70  Temp(Src) 97.2 F (36.2 C) (Oral)  Wt 194 lb 12.8 oz (88.361 kg)  SpO2 97% Wt Readings from Last 3 Encounters:  04/16/13 194 lb 12.8 oz (88.361 kg)  02/23/13 193 lb 6.4 oz (87.726 kg)  09/29/12 191 lb 9.6 oz (86.909 kg)   Constitutional: He appears well-developed and well-nourished. No distress. Son at side Neck: Normal range of motion. Neck supple. No JVD present. No thyromegaly present.  Cardiovascular: Normal rate, regular rhythm and normal heart sounds.  No murmur heard. no BLE edema Pulmonary/Chest: Effort normal -breath sounds clear without rhonchi or wheezes.  Psychiatric: he has a normal mood and affect. behavior is normal. Judgment and thought content normal.   Lab Results  Component Value Date   WBC 8.9 02/23/2013   HGB 15.5 02/23/2013   HCT 44.7 02/23/2013   PLT 207.0 02/23/2013   CHOL 148 09/29/2012   TRIG 278.0* 09/29/2012   HDL 25.40* 09/29/2012   LDLDIRECT 93.1 09/29/2012   ALT 25 02/23/2013   AST 20 02/23/2013   NA 139 02/23/2013   K 3.9 02/23/2013   CL 103 02/23/2013   CREATININE 1.3 02/23/2013   BUN 18 02/23/2013   CO2 28 02/23/2013   TSH 2.36 10/01/2011   PSA 1.87 03/31/2012   HGBA1C 6.7* 02/23/2013   MICROALBUR 0.2 09/29/2012   Exercise treadmill stress test -  04/09/13: no ischemic change      Assessment & Plan:   Atypical chest pain. Resolved with medication changes for GI, see below. No evidence for ischemic disease on exercise stress test.  Problem List Items Addressed This Visit   DIABETES MELLITUS, TYPE II - Primary      Well controlled by hx - check a1c q37mo to monitor On ACEI, last LDL<100 - q6-76mo eye exams with optho The current medical regimen is effective;  continue present plan and medications.  Lab Results  Component Value Date   HGBA1C 6.7* 02/23/2013      GERD (gastroesophageal reflux disease)     Changed H2B to  prn PPI 09/2011 due to increased GI symptoms recommended daily PPI 02/2013 and 30d carafate trial due to atypical CP GI symptoms have resolved, no recurrence of chest pain The current medical regimen is effective;  continue present plan and medications.     HYPERTENSION      BP Readings from Last 3 Encounters:  04/16/13 118/52  02/23/13 132/70  09/29/12 110/62   The current medical regimen is effective;  continue present plan and medications. On ACEI      Time spent with pt/family today 25 minutes, greater than 50% time spent counseling patient on causes of CP, GERD, diabetes and medication review. Also review of prior records

## 2013-04-16 NOTE — Assessment & Plan Note (Signed)
Changed H2B to prn PPI 09/2011 due to increased GI symptoms recommended daily PPI 02/2013 and 30d carafate trial due to atypical CP GI symptoms have resolved, no recurrence of chest pain The current medical regimen is effective;  continue present plan and medications.

## 2013-04-16 NOTE — Progress Notes (Signed)
Timothy Snyder [id number] 04/16/2013   Chief Complaint  Patient presents with  . Follow-up    6 months    Subjective  HPI Here for follow up - prior OV for Atypical CP / Abdominal pain with bloating reviewed Patient reports complete resolution of previous symptoms that began in December of severe gas, bloating, and abdominal pain.  No CP, abdominal pain, fever, chills, SOB, N, V, D, C, or blood in stool.  Passing gas regularly.  Compliant with medications.  No longer ingesting petroleum.    Occasional RLS - Controlled with clonazapam    Past Medical History  Diagnosis Date  . Actinic keratosis   . ARTHRITIS   . Macular degeneration     L>R vision loss, legally blind  . Restless leg syndrome   . BPH (benign prostatic hyperplasia)   . HYPERTENSION   . GLUCOMA   . GERD (gastroesophageal reflux disease)   . DIABETES MELLITUS, TYPE II     Past Surgical History  Procedure Laterality Date  . No past surgeries      No family history on file.  History  Substance Use Topics  . Smoking status: Never Smoker   . Smokeless tobacco: Not on file     Comment: Single, widowed 06/2006. Moved to [facility] from [place] 06/2009 to be near son/dtr. Lives alone & no driving, legally blind due to macular degeneration  . Alcohol Use: No    Current Outpatient Prescriptions on File Prior to Visit  Medication Sig Dispense Refill  . aspirin 81 MG tablet Take 81 mg by mouth daily.      . beta carotene w/minerals (OCUVITE) tablet Take 1 tablet by mouth daily.      . clonazePAM (KLONOPIN) 0.5 MG tablet Take 1 tablet (0.5 mg total) by mouth at bedtime.  90 tablet  0  . diphenhydramine-acetaminophen (TYLENOL PM) 25-500 MG TABS Take 1 tablet by mouth at bedtime as needed.        . doxazosin (CARDURA) 2 MG tablet Take 1 tablet (2 mg total) by mouth daily.  90 tablet  3  . finasteride (PROSCAR) 5 MG tablet Take 1 tablet (5 mg total) by mouth daily.  90 tablet  0  . glimepiride (AMARYL) 2 MG tablet TAKE  ONE TABLET BY MOUTH ONCE DAILY BEFORE BREAKFAST  90 tablet  1  . glucose blood (PRODIGY NO CODING BLOOD GLUC) test strip 1 each by Other route daily. Use as instructed  100 each  1  . lisinopril (PRINIVIL,ZESTRIL) 2.5 MG tablet TAKE ONE TABLET BY MOUTH ONCE DAILY  90 tablet  1  . Loratadine 10 MG CAPS Take 10 mg by mouth 2 (two) times daily.      . NON FORMULARY Mega Red take 1 by mouth daily       . omeprazole (PRILOSEC) 40 MG capsule Take 1 capsule (40 mg total) by mouth daily.  90 capsule  3  . PRODIGY LANCETS 26G MISC 1 each by Does not apply route daily. Dx code 250.00  100 each  1  . tamsulosin (FLOMAX) 0.4 MG CAPS capsule Take 1 capsule (0.4 mg total) by mouth daily after supper. HOLD until further notice  30 capsule  3  . timolol (TIMOPTIC-XR) 0.5 % ophthalmic gel-forming Place 1 drop into both eyes daily.       . [DISCONTINUED] bimatoprost (LUMIGAN) 0.03 % ophthalmic solution Place 1 drop into both eyes at bedtime.         No current facility-administered  medications on file prior to visit.    Allergies: No Known Allergies  Review of Systems  Constitutional: Negative for fever, chills, weight loss and diaphoresis.  Respiratory: Negative for cough and shortness of breath.   Cardiovascular: Negative for chest pain, palpitations and orthopnea.  Gastrointestinal: Negative for heartburn, nausea, vomiting, abdominal pain, diarrhea and constipation.       Objective  Filed Vitals:   04/16/13 1132  BP: 118/52  Pulse: 70  Temp: 97.2 F (36.2 C)  TempSrc: Oral  Weight: 194 lb 12.8 oz (88.361 kg)  SpO2: 97%    Physical Exam  Constitutional: He is oriented to person, place, and time. He appears well-developed and well-nourished. No distress.  Nontoxic  HENT:  Head: Normocephalic and atraumatic.  Eyes: Conjunctivae and EOM are normal. Pupils are equal, round, and reactive to light. No scleral icterus.  Neck: Normal range of motion. Neck supple. No JVD present.  Cardiovascular:  Normal rate and regular rhythm.  Exam reveals no gallop and no friction rub.   No murmur heard. No edema b/l   Respiratory: Effort normal and breath sounds normal. No stridor. No respiratory distress. He has no wheezes. He has no rales.  GI: Soft. Bowel sounds are normal. He exhibits distension (Moderate distension / bloating). He exhibits no mass. There is no tenderness. There is no rebound and no guarding.  Musculoskeletal: Normal range of motion. He exhibits no edema and no tenderness.  Lymphadenopathy:    He has no cervical adenopathy.  Neurological: He is alert and oriented to person, place, and time.  Skin: He is not diaphoretic.  Psychiatric: He has a normal mood and affect. His behavior is normal. Judgment and thought content normal.    BP Readings from Last 3 Encounters:  04/16/13 118/52  02/23/13 132/70  09/29/12 110/62    Wt Readings from Last 3 Encounters:  04/16/13 194 lb 12.8 oz (88.361 kg)  02/23/13 193 lb 6.4 oz (87.726 kg)  09/29/12 191 lb 9.6 oz (86.909 kg)    Lab Results  Component Value Date   WBC 8.9 02/23/2013   HGB 15.5 02/23/2013   HCT 44.7 02/23/2013   PLT 207.0 02/23/2013   GLUCOSE 97 02/23/2013   CHOL 148 09/29/2012   TRIG 278.0* 09/29/2012   HDL 25.40* 09/29/2012   LDLDIRECT 93.1 09/29/2012   LDLCALC 85 10/01/2011   ALT 25 02/23/2013   AST 20 02/23/2013   NA 139 02/23/2013   K 3.9 02/23/2013   CL 103 02/23/2013   CREATININE 1.3 02/23/2013   BUN 18 02/23/2013   CO2 28 02/23/2013   TSH 2.36 10/01/2011   PSA 1.87 03/31/2012   HGBA1C 6.7* 02/23/2013   MICROALBUR 0.2 09/29/2012   Cardiac Stress test 04/09/13 - WNL, No ischemic changes   Assessment and Plan  Atypical CP - 04/09/13 Stress test was normal.   No ischemic changes.   Symptoms resolved with changes to medications for GI. Patient instructed to f/u if symptoms return.  Abdominal Pain with bloating - Symptoms resolved with changes to medications.  Continue prescribed medications for same.  Patient instructed to f/u if  symptoms return and/or are no longer controlled with medications.  Consider referral to GI with recurrence of symptoms not controlled with current medication.  Patient was instructed to notify the office if any new symptoms emerged or the current symptoms become worse.   Return in about 6 months (around 10/14/2013) for recheck diabetes and labs.  Wynelle Cleveland    I  have personally reviewed this case with PA student. I also personally examined this patient. I agree with history and findings as documented above. I reviewed, discussed and approve of the assessment and plan as listed above. Gwendolyn Grant, MD

## 2013-04-19 ENCOUNTER — Ambulatory Visit: Payer: Medicare Other | Admitting: Internal Medicine

## 2013-04-20 ENCOUNTER — Telehealth: Payer: Self-pay

## 2013-04-20 NOTE — Telephone Encounter (Signed)
Relevant patient education assigned to patient using Emmi. ° °

## 2013-04-22 ENCOUNTER — Encounter: Payer: Self-pay | Admitting: Internal Medicine

## 2013-04-23 ENCOUNTER — Other Ambulatory Visit: Payer: Self-pay | Admitting: *Deleted

## 2013-04-23 MED ORDER — FINASTERIDE 5 MG PO TABS: 5.0000 mg | ORAL_TABLET | Freq: Every day | ORAL | Status: DC

## 2013-04-23 NOTE — Telephone Encounter (Signed)
Sent email pt needing refill on his finesteride...Timothy Snyder

## 2013-05-19 LAB — HM DIABETES EYE EXAM

## 2013-05-31 DIAGNOSIS — H4011X0 Primary open-angle glaucoma, stage unspecified: Secondary | ICD-10-CM | POA: Diagnosis not present

## 2013-05-31 DIAGNOSIS — H353290 Exudative age-related macular degeneration, unspecified eye, stage unspecified: Secondary | ICD-10-CM | POA: Diagnosis not present

## 2013-05-31 DIAGNOSIS — H409 Unspecified glaucoma: Secondary | ICD-10-CM | POA: Diagnosis not present

## 2013-05-31 DIAGNOSIS — H211X9 Other vascular disorders of iris and ciliary body, unspecified eye: Secondary | ICD-10-CM | POA: Diagnosis not present

## 2013-05-31 DIAGNOSIS — E11359 Type 2 diabetes mellitus with proliferative diabetic retinopathy without macular edema: Secondary | ICD-10-CM | POA: Diagnosis not present

## 2013-06-07 DIAGNOSIS — D485 Neoplasm of uncertain behavior of skin: Secondary | ICD-10-CM | POA: Diagnosis not present

## 2013-06-07 DIAGNOSIS — I781 Nevus, non-neoplastic: Secondary | ICD-10-CM | POA: Diagnosis not present

## 2013-06-07 DIAGNOSIS — D239 Other benign neoplasm of skin, unspecified: Secondary | ICD-10-CM | POA: Diagnosis not present

## 2013-06-07 DIAGNOSIS — Z85828 Personal history of other malignant neoplasm of skin: Secondary | ICD-10-CM | POA: Diagnosis not present

## 2013-06-07 DIAGNOSIS — L259 Unspecified contact dermatitis, unspecified cause: Secondary | ICD-10-CM | POA: Diagnosis not present

## 2013-06-07 DIAGNOSIS — L570 Actinic keratosis: Secondary | ICD-10-CM | POA: Diagnosis not present

## 2013-06-09 DIAGNOSIS — L578 Other skin changes due to chronic exposure to nonionizing radiation: Secondary | ICD-10-CM | POA: Diagnosis not present

## 2013-06-09 DIAGNOSIS — L259 Unspecified contact dermatitis, unspecified cause: Secondary | ICD-10-CM | POA: Diagnosis not present

## 2013-06-09 DIAGNOSIS — B079 Viral wart, unspecified: Secondary | ICD-10-CM | POA: Diagnosis not present

## 2013-06-12 ENCOUNTER — Other Ambulatory Visit: Payer: Self-pay | Admitting: Internal Medicine

## 2013-06-16 ENCOUNTER — Other Ambulatory Visit: Payer: Self-pay | Admitting: Internal Medicine

## 2013-06-16 ENCOUNTER — Encounter: Payer: Self-pay | Admitting: Internal Medicine

## 2013-06-16 MED ORDER — CLONAZEPAM 0.5 MG PO TABS: ORAL_TABLET | ORAL | Status: DC

## 2013-06-16 NOTE — Telephone Encounter (Signed)
Duplicate msg Dr. Linna Darner approved has been faxed back to [place]...Johny Chess

## 2013-07-23 ENCOUNTER — Other Ambulatory Visit: Payer: Self-pay | Admitting: Internal Medicine

## 2013-07-26 MED ORDER — CLONAZEPAM 0.5 MG PO TABS: ORAL_TABLET | ORAL | Status: DC

## 2013-07-26 NOTE — Telephone Encounter (Signed)
Faxed script bck to [facility]...lmb 

## 2013-07-28 ENCOUNTER — Encounter: Payer: Self-pay | Admitting: Internal Medicine

## 2013-07-29 ENCOUNTER — Telehealth: Payer: Self-pay | Admitting: *Deleted

## 2013-07-29 MED ORDER — ESOMEPRAZOLE MAGNESIUM 40 MG PO PACK: 40.0000 mg | PACK | Freq: Every day | ORAL | Status: DC

## 2013-07-29 NOTE — Telephone Encounter (Signed)
Change omeprazole to nexium printed and place up front...Timothy Snyder

## 2013-07-29 NOTE — Telephone Encounter (Signed)
Son Timothy Snyder) sent email wanting md to replace his omeprazole to nexium. MD states its ok printed rx for pt son to pick up per his request.../lmb

## 2013-08-09 ENCOUNTER — Encounter: Payer: Self-pay | Admitting: Internal Medicine

## 2013-08-16 MED ORDER — DOXAZOSIN MESYLATE 4 MG PO TABS: 4.0000 mg | ORAL_TABLET | Freq: Every day | ORAL | Status: DC

## 2013-09-10 ENCOUNTER — Encounter: Payer: Self-pay | Admitting: Internal Medicine

## 2013-09-13 MED ORDER — CLONAZEPAM 0.5 MG PO TABS: ORAL_TABLET | ORAL | Status: DC

## 2013-10-13 ENCOUNTER — Ambulatory Visit (INDEPENDENT_AMBULATORY_CARE_PROVIDER_SITE_OTHER): Payer: Medicare Other | Admitting: Internal Medicine

## 2013-10-13 ENCOUNTER — Other Ambulatory Visit (INDEPENDENT_AMBULATORY_CARE_PROVIDER_SITE_OTHER): Payer: Medicare Other

## 2013-10-13 ENCOUNTER — Encounter: Payer: Self-pay | Admitting: Internal Medicine

## 2013-10-13 VITALS — BP 120/64 | HR 69 | Temp 97.4°F | Ht 68.0 in | Wt 193.2 lb

## 2013-10-13 DIAGNOSIS — E119 Type 2 diabetes mellitus without complications: Secondary | ICD-10-CM | POA: Diagnosis not present

## 2013-10-13 DIAGNOSIS — Z23 Encounter for immunization: Secondary | ICD-10-CM | POA: Diagnosis not present

## 2013-10-13 DIAGNOSIS — Z0000 Encounter for general adult medical examination without abnormal findings: Secondary | ICD-10-CM | POA: Diagnosis not present

## 2013-10-13 LAB — MICROALBUMIN / CREATININE URINE RATIO
Creatinine,U: 95.2 mg/dL
Microalb Creat Ratio: 0.6 mg/g (ref 0.0–30.0)
Microalb, Ur: 0.6 mg/dL (ref 0.0–1.9)

## 2013-10-13 LAB — LIPID PANEL
Cholesterol: 116 mg/dL (ref 0–200)
HDL: 23.6 mg/dL — AB (ref >39.00)
LDL CALC: 75 mg/dL (ref 0–99)
NONHDL: 92.4
Total CHOL/HDL Ratio: 5
Triglycerides: 86 mg/dL (ref 0.0–149.0)
VLDL: 17.2 mg/dL (ref 0.0–40.0)

## 2013-10-13 LAB — HEMOGLOBIN A1C: Hgb A1c MFr Bld: 6.3 % (ref 4.6–6.5)

## 2013-10-13 NOTE — Patient Instructions (Addendum)
It was good to see you today.  We have reviewed your prior records including labs and tests today  Health Maintenance reviewed - annual flu shot updated today -all other recommended immunizations and age-appropriate screenings are up-to-date.  Test(s) ordered today. Your results will be released to [place] (or called to you) after review, usually within 72hours after test completion. If any changes need to be made, you will be notified at that same time.  Medications reviewed and updated, no changes recommended at this time.  Please schedule followup in 6 months for semiannual diabetes mellitus exam and labs, call sooner if problems.  Health Maintenance A healthy lifestyle and preventative care can promote health and wellness.  Maintain regular health, dental, and eye exams.  Eat a healthy diet. Foods like vegetables, fruits, whole grains, low-fat dairy products, and lean protein foods contain the nutrients you need and are low in calories. Decrease your intake of foods high in solid fats, added sugars, and salt. Get information about a proper diet from your health care provider, if necessary.  Regular physical exercise is one of the most important things you can do for your health. Most adults should get at least 150 minutes of moderate-intensity exercise (any activity that increases your heart rate and causes you to sweat) each week. In addition, most adults need muscle-strengthening exercises on 2 or more days a week.   Maintain a healthy weight. The body mass index (BMI) is a screening tool to identify possible weight problems. It provides an estimate of body fat based on height and weight. Your health care provider can find your BMI and can help you achieve or maintain a healthy weight. For males 20 years and older:  A BMI below 18.5 is considered underweight.  A BMI of 18.5 to 24.9 is normal.  A BMI of 25 to 29.9 is considered overweight.  A BMI of 30 and above is considered  obese.  Maintain normal blood lipids and cholesterol by exercising and minimizing your intake of saturated fat. Eat a balanced diet with plenty of fruits and vegetables. Blood tests for lipids and cholesterol should begin at age 40 and be repeated every 5 years. If your lipid or cholesterol levels are high, you are over age 28, or you are at high risk for heart disease, you may need your cholesterol levels checked more frequently.Ongoing high lipid and cholesterol levels should be treated with medicines if diet and exercise are not working.  If you smoke, find out from your health care provider how to quit. If you do not use tobacco, do not start.  Lung cancer screening is recommended for adults aged 37-80 years who are at high risk for developing lung cancer because of a history of smoking. A yearly low-dose CT scan of the lungs is recommended for people who have at least a 30-pack-year history of smoking and are current smokers or have quit within the past 15 years. A pack year of smoking is smoking an average of 1 pack of cigarettes a day for 1 year (for example, a 30-pack-year history of smoking could mean smoking 1 pack a day for 30 years or 2 packs a day for 15 years). Yearly screening should continue until the smoker has stopped smoking for at least 15 years. Yearly screening should be stopped for people who develop a health problem that would prevent them from having lung cancer treatment.  If you choose to drink alcohol, do not have more than 2 drinks per day.  One drink is considered to be 12 oz (360 mL) of beer, 5 oz (150 mL) of wine, or 1.5 oz (45 mL) of liquor.  Avoid the use of street drugs. Do not share needles with anyone. Ask for help if you need support or instructions about stopping the use of drugs.  High blood pressure causes heart disease and increases the risk of stroke. Blood pressure should be checked at least every 1-2 years. Ongoing high blood pressure should be treated with  medicines if weight loss and exercise are not effective.  If you are 56-32 years old, ask your health care provider if you should take aspirin to prevent heart disease.  Diabetes screening involves taking a blood sample to check your fasting blood sugar level. This should be done once every 3 years after age 52 if you are at a normal weight and without risk factors for diabetes. Testing should be considered at a younger age or be carried out more frequently if you are overweight and have at least 1 risk factor for diabetes.  Colorectal cancer can be detected and often prevented. Most routine colorectal cancer screening begins at the age of 68 and continues through age 58. However, your health care provider may recommend screening at an earlier age if you have risk factors for colon cancer. On a yearly basis, your health care provider may provide home test kits to check for hidden blood in the stool. A small camera at the end of a tube may be used to directly examine the colon (sigmoidoscopy or colonoscopy) to detect the earliest forms of colorectal cancer. Talk to your health care provider about this at age 59 when routine screening begins. A direct exam of the colon should be repeated every 5-10 years through age 27, unless early forms of precancerous polyps or small growths are found.  People who are at an increased risk for hepatitis B should be screened for this virus. You are considered at high risk for hepatitis B if:  You were born in a country where hepatitis B occurs often. Talk with your health care provider about which countries are considered high risk.  Your parents were born in a high-risk country and you have not received a shot to protect against hepatitis B (hepatitis B vaccine).  You have HIV or AIDS.  You use needles to inject street drugs.  You live with, or have sex with, someone who has hepatitis B.  You are a man who has sex with other men (MSM).  You get hemodialysis  treatment.  You take certain medicines for conditions like cancer, organ transplantation, and autoimmune conditions.  Hepatitis C blood testing is recommended for all people born from [birth year] through 1963 and any individual with known risk factors for hepatitis C.  Healthy men should no longer receive prostate-specific antigen (PSA) blood tests as part of routine cancer screening. Talk to your health care provider about prostate cancer screening.  Testicular cancer screening is not recommended for adolescents or adult males who have no symptoms. Screening includes self-exam, a health care provider exam, and other screening tests. Consult with your health care provider about any symptoms you have or any concerns you have about testicular cancer.  Practice safe sex. Use condoms and avoid high-risk sexual practices to reduce the spread of sexually transmitted infections (STIs).  You should be screened for STIs, including gonorrhea and chlamydia if:  You are sexually active and are younger than 24 years.  You are older than 24  years, and your health care provider tells you that you are at risk for this type of infection.  Your sexual activity has changed since you were last screened, and you are at an increased risk for chlamydia or gonorrhea. Ask your health care provider if you are at risk.  If you are at risk of being infected with HIV, it is recommended that you take a prescription medicine daily to prevent HIV infection. This is called pre-exposure prophylaxis (PrEP). You are considered at risk if:  You are a man who has sex with other men (MSM).  You are a heterosexual man who is sexually active with multiple partners.  You take drugs by injection.  You are sexually active with a partner who has HIV.  Talk with your health care provider about whether you are at high risk of being infected with HIV. If you choose to begin PrEP, you should first be tested for HIV. You should then be tested  every 3 months for as long as you are taking PrEP.  Use sunscreen. Apply sunscreen liberally and repeatedly throughout the day. You should seek shade when your shadow is shorter than you. Protect yourself by wearing long sleeves, pants, a wide-brimmed hat, and sunglasses year round whenever you are outdoors.  Tell your health care provider of new moles or changes in moles, especially if there is a change in shape or color. Also, tell your health care provider if a mole is larger than the size of a pencil eraser.  A one-time screening for abdominal aortic aneurysm (AAA) and surgical repair of large AAAs by ultrasound is recommended for men aged 103-75 years who are current or former smokers.  Stay current with your vaccines (immunizations). Document Released: 08/03/2007 Document Revised: 02/09/2013 Document Reviewed: 07/02/2010 [facility] Patient Information 2015 [place], [place]. This information is not intended to replace advice given to you by your health care provider. Make sure you discuss any questions you have with your health care provider.

## 2013-10-13 NOTE — Progress Notes (Signed)
Subjective:    Patient ID: Timothy Snyder, male    DOB: 1927-12-29, 80 y.o.   MRN: [id number]  HPI   Here for medicare wellness  Diet: heart healthy  Physical activity: sedentary Depression/mood screen: negative Hearing: intact to whispered voice Visual acuity: impaired from MD, performs semiannual eye exam  ADLs: capable Fall risk: none Home safety: good Cognitive evaluation: intact to orientation, naming, recall and repetition EOL planning: adv directives, pt to review with family  I have personally reviewed and have noted 1. The patient's medical and social history 2. Their use of alcohol, tobacco or illicit drugs 3. Their current medications and supplements 4. The patient's functional ability including ADL's, fall risks, home safety risks and hearing or visual impairment. 5. Diet and physical activities 6. Evidence for depression or mood disorders  Also reviewed chronic medical issues and interval medical events  Past Medical History  Diagnosis Date  . Actinic keratosis   . ARTHRITIS   . Macular degeneration     L>R vision loss, legally blind  . Restless leg syndrome   . BPH (benign prostatic hyperplasia)   . HYPERTENSION   . GLUCOMA   . GERD (gastroesophageal reflux disease)   . DIABETES MELLITUS, TYPE II    History reviewed. No pertinent family history. History  Substance Use Topics  . Smoking status: Never Smoker   . Smokeless tobacco: Not on file  . Alcohol Use: No    Review of Systems  Constitutional: Negative for fever, activity change, appetite change, fatigue and unexpected weight change.  Respiratory: Negative for cough, chest tightness, shortness of breath and wheezing.   Cardiovascular: Negative for chest pain, palpitations and leg swelling.  Neurological: Negative for dizziness, weakness and headaches.  Psychiatric/Behavioral: Negative for dysphoric mood. The patient is not nervous/anxious.   All other systems reviewed and are negative.      Objective:   Physical Exam  BP 120/64  Pulse 69  Temp(Src) 97.4 F (36.3 C) (Oral)  Ht 5\' 8"  (1.727 m)  Wt 193 lb 4 oz (87.658 kg)  BMI 29.39 kg/m2  SpO2 95% Wt Readings from Last 3 Encounters:  10/13/13 193 lb 4 oz (87.658 kg)  04/16/13 194 lb 12.8 oz (88.361 kg)  02/23/13 193 lb 6.4 oz (87.726 kg)   Constitutional: he is overweight, appears well-developed and well-nourished. No distress. Legally blind. Son at side Neck: Normal range of motion. Neck supple. No JVD present. No thyromegaly present.  Cardiovascular: Normal rate, regular rhythm and normal heart sounds.  No murmur heard. No BLE edema. Pulmonary/Chest: Effort normal and breath sounds normal. No respiratory distress. he has no wheezes.  Psychiatric: he has a normal mood and affect. His behavior is normal. Judgment and thought content normal.   Lab Results  Component Value Date   WBC 8.9 02/23/2013   HGB 15.5 02/23/2013   HCT 44.7 02/23/2013   PLT 207.0 02/23/2013   GLUCOSE 97 02/23/2013   CHOL 148 09/29/2012   TRIG 278.0* 09/29/2012   HDL 25.40* 09/29/2012   LDLDIRECT 93.1 09/29/2012   LDLCALC 85 10/01/2011   ALT 25 02/23/2013   AST 20 02/23/2013   NA 139 02/23/2013   K 3.9 02/23/2013   CL 103 02/23/2013   CREATININE 1.3 02/23/2013   BUN 18 02/23/2013   CO2 28 02/23/2013   TSH 2.36 10/01/2011   PSA 1.87 03/31/2012   HGBA1C 6.7* 02/23/2013   MICROALBUR 0.2 09/29/2012    No results found.     Assessment &  Plan:   AWV/v70.0 - Today patient counseled on age appropriate routine health concerns for screening and prevention, each reviewed and up to date or declined. Immunizations reviewed and up to date or declined. Labs ordered and reviewed. Risk factors for depression reviewed and negative. Hearing function and visual acuity are intact. ADLs screened and addressed as needed. Functional ability and level of safety reviewed and appropriate. Education, counseling and referrals performed based on assessed risks today. Patient provided with a  copy of personalized plan for preventive services.  Problem List Items Addressed This Visit   DIABETES MELLITUS, TYPE II      Well controlled by hx - check a1c q33mo to monitor On ACEI, last LDL<100 - q6-2mo eye exams with optho The current medical regimen is effective;  continue present plan and medications.  Lab Results  Component Value Date   HGBA1C 6.7* 02/23/2013      Relevant Orders      Hemoglobin A1c      Lipid panel      Microalbumin / creatinine urine ratio    Other Visit Diagnoses   Routine general medical examination at a health care facility    -  Primary

## 2013-10-13 NOTE — Assessment & Plan Note (Signed)
Well controlled by hx - check a1c q74mo to monitor On ACEI, last LDL<100 - q6-30mo eye exams with optho The current medical regimen is effective;  continue present plan and medications.  Lab Results  Component Value Date   HGBA1C 6.7* 02/23/2013

## 2013-10-13 NOTE — Progress Notes (Signed)
Pre visit review using our clinic review tool, if applicable. No additional management support is needed unless otherwise documented below in the visit note. 

## 2014-01-07 ENCOUNTER — Encounter: Payer: Self-pay | Admitting: Internal Medicine

## 2014-01-10 ENCOUNTER — Ambulatory Visit (INDEPENDENT_AMBULATORY_CARE_PROVIDER_SITE_OTHER): Payer: Medicare Other | Admitting: Internal Medicine

## 2014-01-10 ENCOUNTER — Encounter: Payer: Self-pay | Admitting: Internal Medicine

## 2014-01-10 VITALS — BP 140/74 | HR 93 | Temp 99.2°F | Wt 199.0 lb

## 2014-01-10 DIAGNOSIS — R059 Cough, unspecified: Secondary | ICD-10-CM

## 2014-01-10 DIAGNOSIS — R05 Cough: Secondary | ICD-10-CM | POA: Diagnosis not present

## 2014-01-10 DIAGNOSIS — H10023 Other mucopurulent conjunctivitis, bilateral: Secondary | ICD-10-CM | POA: Diagnosis not present

## 2014-01-10 DIAGNOSIS — J310 Chronic rhinitis: Secondary | ICD-10-CM | POA: Diagnosis not present

## 2014-01-10 DIAGNOSIS — H109 Unspecified conjunctivitis: Secondary | ICD-10-CM

## 2014-01-10 MED ORDER — AMOXICILLIN 500 MG PO CAPS: 500.0000 mg | ORAL_CAPSULE | Freq: Three times a day (TID) | ORAL | Status: DC

## 2014-01-10 MED ORDER — ERYTHROMYCIN 5 MG/GM OP OINT: TOPICAL_OINTMENT | OPHTHALMIC | Status: DC

## 2014-01-10 NOTE — Progress Notes (Signed)
Pre visit review using our clinic review tool, if applicable. No additional management support is needed unless otherwise documented below in the visit note. 

## 2014-01-10 NOTE — Progress Notes (Signed)
   Subjective:    Patient ID: Timothy Snyder, male    DOB: 1928/01/29, 80 y.o.   MRN: [id number]  HPI  His symptoms began Sunday 01/02/14 as rhinitis ,sore throat  & chest congestion. The cough was described as nonproductive.  He used Robitussin-DM with some benefit  He was worse over the weekend 11/21-22 with increased facial sinus congestion, cough, and wheezing. This is also associated shortness of breath  He describes some itchy, watery eyes, and sneezing  He is blind and has glaucoma. He is obviously unable to assess head & chest secretions. He has not smoked since 1961.  He is a diabetic; his last fasting blood sugar was 161 last week. Apparently it is monitored by a health aide who comes to his home.    Review of Systems Frontal headache,  otic pain or otic discharge denied. No fever , chills or sweats.     Objective:   Physical Exam   Pertinent or positive findings include: He has pattern alopecia; he has a goatee. There is marked erythema of the conjunctiva without purulent discharge.Matting present w/o frank purulence Upper dental plate present; he is not wearing his lower plate. Nonproductive cough without associated wheezing or increased work of breathing is present.  General appearance:good health ;adequately nourished; no acute distress or increased work of breathing is present.  No  lymphadenopathy about the head, neck, or axilla noted.  Ears:  External ear exam shows no significant lesions or deformities.  Otoscopic examination reveals clear canals, tympanic membranes are intact bilaterally without bulging, retraction, inflammation or discharge. Nose:  External nasal examination shows no deformity or inflammation. Nasal mucosa are pink and moist without lesions or exudates. No septal dislocation or deviation.No obstruction to airflow.  Oral exam: lips and gums are healthy appearing.There is no oropharyngeal erythema or exudate noted.  Neck:  No deformities,  thyromegaly, masses, or tenderness noted.   Supple without pain.  Heart:  Normal rate and regular rhythm. S1 and S2 normal without gallop, murmur, click, rub or other extra sounds. Extremities:  No cyanosis, edema, or clubbing  noted  Skin: Warm & dry w/o jaundice or tenting.         Assessment & Plan:  #1 non allergic rhinitis; R/O rhinosinusitis (patient unable to assess character of secretions due to vision loss) #2 cough #3 conjunctivitis See Orders & AVS

## 2014-01-10 NOTE — Patient Instructions (Addendum)
Plain Mucinex (NOT D) for thick secretions ;force NON dairy fluids .   Flonase OR Nasacort AQ 1 spray in each nostril twice a day as needed. Use the "crossover" technique into opposite nostril spraying toward opposite ear @ 45 degree angle, not straight up into nostril.  Plain Allegra (NOT D )  160 daily , Loratidine 10 mg , OR Zyrtec 10 mg @ bedtime  as needed for itchy eyes & sneezing.  Use the ophthalmologic ointment as directed. Strict hand washing before and after its application.

## 2014-01-11 ENCOUNTER — Other Ambulatory Visit: Payer: Self-pay | Admitting: Internal Medicine

## 2014-01-14 ENCOUNTER — Encounter (HOSPITAL_COMMUNITY): Payer: Self-pay | Admitting: *Deleted

## 2014-01-14 ENCOUNTER — Emergency Department (INDEPENDENT_AMBULATORY_CARE_PROVIDER_SITE_OTHER)
Admission: EM | Admit: 2014-01-14 | Discharge: 2014-01-14 | Disposition: A | Discharge status: Home / Self Care | Payer: Medicare Other | Source: Home / Self Care | Attending: Family Medicine | Admitting: Family Medicine

## 2014-01-14 ENCOUNTER — Ambulatory Visit (HOSPITAL_COMMUNITY): Payer: Medicare Other | Attending: Family Medicine

## 2014-01-14 DIAGNOSIS — Z87891 Personal history of nicotine dependence: Secondary | ICD-10-CM | POA: Diagnosis not present

## 2014-01-14 DIAGNOSIS — R05 Cough: Secondary | ICD-10-CM | POA: Insufficient documentation

## 2014-01-14 DIAGNOSIS — E119 Type 2 diabetes mellitus without complications: Secondary | ICD-10-CM | POA: Insufficient documentation

## 2014-01-14 DIAGNOSIS — I10 Essential (primary) hypertension: Secondary | ICD-10-CM | POA: Diagnosis not present

## 2014-01-14 DIAGNOSIS — J441 Chronic obstructive pulmonary disease with (acute) exacerbation: Secondary | ICD-10-CM

## 2014-01-14 DIAGNOSIS — R062 Wheezing: Secondary | ICD-10-CM | POA: Insufficient documentation

## 2014-01-14 MED ORDER — MINOCYCLINE HCL 100 MG PO CAPS: 100.0000 mg | ORAL_CAPSULE | Freq: Two times a day (BID) | ORAL | Status: DC

## 2014-01-14 NOTE — ED Provider Notes (Signed)
CSN: [id number]     Arrival date & time 01/14/14  1423 History   First MD Initiated Contact with Patient 01/14/14 0950     Chief Complaint  Patient presents with  . Follow-up   (Consider location/radiation/quality/duration/timing/severity/associated sxs/prior Treatment) Patient is a 80 y.o. male presenting with cough. The history is provided by the patient and a relative.  Cough Cough characteristics:  Non-productive, dry and harsh Severity:  Moderate Onset quality:  Gradual Duration:  4 days Chronicity:  New Smoker: no   Context comment:  Seen by dr hopper on mon and given meds, family reports no improvement, sl worse, cough. Associated symptoms: fever and rhinorrhea   Associated symptoms: no chest pain, no chills and no shortness of breath     Past Medical History  Diagnosis Date  . Actinic keratosis   . ARTHRITIS   . Macular degeneration     L>R vision loss, legally blind  . Restless leg syndrome   . BPH (benign prostatic hyperplasia)   . HYPERTENSION   . GLUCOMA   . GERD (gastroesophageal reflux disease)   . DIABETES MELLITUS, TYPE II    Past Surgical History  Procedure Laterality Date  . No past surgeries     History reviewed. No pertinent family history. History  Substance Use Topics  . Smoking status: Never Smoker   . Smokeless tobacco: Not on file  . Alcohol Use: No    Review of Systems  Constitutional: Positive for fever. Negative for chills.  HENT: Positive for rhinorrhea.   Respiratory: Positive for cough. Negative for shortness of breath.   Cardiovascular: Negative.  Negative for chest pain.    Allergies  Review of patient's allergies indicates no known allergies.  Home Medications   Prior to Admission medications   Medication Sig Start Date End Date Taking? Authorizing Provider  amoxicillin (AMOXIL) 500 MG capsule Take 1 capsule (500 mg total) by mouth 3 (three) times daily. 01/10/14   Hendricks Limes, MD  aspirin 81 MG tablet Take 81 mg by  mouth daily.    Historical Provider, MD  beta carotene w/minerals (OCUVITE) tablet Take 1 tablet by mouth daily.    Historical Provider, MD  clonazePAM (KLONOPIN) 0.5 MG tablet TAKE ONE TABLET BY MOUTH AT BEDTIME 09/13/13   Rowe Clack, MD  diphenhydramine-acetaminophen (TYLENOL PM) 25-500 MG TABS Take 1 tablet by mouth at bedtime as needed.      Historical Provider, MD  doxazosin (CARDURA) 4 MG tablet Take 1 tablet (4 mg total) by mouth daily. 08/16/13   Rowe Clack, MD  erythromycin ophthalmic ointment 5 X a day into lower eye sac bilaterally 01/10/14   Hendricks Limes, MD  esomeprazole (NEXIUM) 40 MG packet Take 40 mg by mouth daily before breakfast. 07/29/13   Rowe Clack, MD  finasteride (PROSCAR) 5 MG tablet TAKE ONE TABLET BY MOUTH ONCE DAILY 01/11/14   Rowe Clack, MD  glimepiride (AMARYL) 2 MG tablet TAKE ONE TABLET BY MOUTH ONCE DAILY BEFORE  BREAKFAST 01/11/14   Rowe Clack, MD  glucose blood (PRODIGY NO CODING BLOOD GLUC) test strip 1 each by Other route daily. Use as instructed 05/04/12   Rowe Clack, MD  lisinopril (PRINIVIL,ZESTRIL) 2.5 MG tablet TAKE ONE TABLET BY MOUTH ONCE DAILY 01/11/14   Rowe Clack, MD  Loratadine 10 MG CAPS Take 10 mg by mouth 2 (two) times daily.    Historical Provider, MD  minocycline (MINOCIN,DYNACIN) 100 MG capsule Take 1 capsule (  100 mg total) by mouth 2 (two) times daily. 01/14/14   Billy Fischer, MD  NON FORMULARY Mega Red take 1 by mouth daily     Historical Provider, MD  PRODIGY LANCETS 26G MISC 1 each by Does not apply route daily. Dx code 250.00 05/04/12   Rowe Clack, MD  tamsulosin (FLOMAX) 0.4 MG CAPS capsule Take 1 capsule (0.4 mg total) by mouth daily after supper. HOLD until further notice 02/23/13   Rowe Clack, MD  timolol (TIMOPTIC-XR) 0.5 % ophthalmic gel-forming Place 1 drop into both eyes daily.     Historical Provider, MD   BP 136/67 mmHg  Pulse 94  Temp(Src) 98 F (36.7 C)  (Oral)  Resp 20  SpO2 96% Physical Exam  Constitutional: He is oriented to person, place, and time. He appears well-developed and well-nourished.  HENT:  Right Ear: External ear normal.  Left Ear: External ear normal.  Mouth/Throat: Oropharynx is clear and moist.  Neck: Normal range of motion. Neck supple.  Cardiovascular: Normal heart sounds and intact distal pulses.   Pulmonary/Chest: Effort normal. No respiratory distress. He has no wheezes. He has rales.  Lymphadenopathy:    He has no cervical adenopathy.  Neurological: He is alert and oriented to person, place, and time.  Skin: Skin is warm and dry.  Nursing note and vitals reviewed.   ED Course  Procedures (including critical care time) Labs Review Labs Reviewed - No data to display  Imaging Review Dg Chest 2 View  01/14/2014   CLINICAL DATA:  Two weeks of nonproductive cough and wheezing; previous tobacco use ; history of diabetes and hypertension  EXAM: CHEST  2 VIEW  COMPARISON:  None.  FINDINGS: The lungs are adequately inflated. The interstitial markings are increased bilaterally. There is a calcified nodule measuring approximately 6 mm in diameter projecting over left lateral costophrenic angle. There is pleural thickening along the lateral thoracic walls bilaterally. There is no pleural effusion or pneumothorax. The heart and pulmonary vascularity are within the limits of normal. The bony thorax exhibits no acute abnormality.  IMPRESSION: Bilaterally increased interstitial markings may be related to the patient's smoking history. Superimposed acute bronchitis or early interstitial pneumonia is suspected. There is evidence of previous granulomatous infection. Follow-up films following anticipated antibiotic therapy are recommended to assure clearing. Chest CT scanning may be ultimately needed to more fully evaluate the pulmonary parenchyma.   Electronically Signed   By: David  Martin[place]   On: 01/14/2014 11:10    X-rays reviewed  and report per radiologist.  MDM   1. Acute exacerbation of chronic obstructive bronchitis        Billy Fischer, MD 01/14/14 1152

## 2014-01-14 NOTE — ED Notes (Signed)
Pt  Seen  4  Days      Ago  By  Dr  Linna Darner        For   Timothy Snyder    And  Eye  Problem     -      Continues         To  Have   Symptoms  Of  Cough    And  Congestion          His  Eye   Is  Better   -  His cough is  worse     His  Son    Is  At  Bedside

## 2014-01-14 NOTE — Discharge Instructions (Signed)
Take all of medicine, drink lots of fluids, see your doctor if further problems °

## 2014-01-17 ENCOUNTER — Encounter: Payer: Self-pay | Admitting: Internal Medicine

## 2014-01-18 ENCOUNTER — Encounter: Payer: Self-pay | Admitting: Internal Medicine

## 2014-01-18 NOTE — Telephone Encounter (Signed)
stef - this message was "handled" by Erline Levine but does not seem to have actually been addressed (no followup OV has been scheduled nor has the pt/son been contacted with any information)  Please schedule a ROV with someone (but not [facility]opp as noted) and let pt's son know same -  Thanks!

## 2014-01-19 DIAGNOSIS — H01003 Unspecified blepharitis right eye, unspecified eyelid: Secondary | ICD-10-CM | POA: Diagnosis not present

## 2014-01-19 DIAGNOSIS — H4089 Other specified glaucoma: Secondary | ICD-10-CM | POA: Diagnosis not present

## 2014-01-19 DIAGNOSIS — H211X2 Other vascular disorders of iris and ciliary body, left eye: Secondary | ICD-10-CM | POA: Diagnosis not present

## 2014-01-19 DIAGNOSIS — H01006 Unspecified blepharitis left eye, unspecified eyelid: Secondary | ICD-10-CM | POA: Diagnosis not present

## 2014-01-19 DIAGNOSIS — H4011X3 Primary open-angle glaucoma, severe stage: Secondary | ICD-10-CM | POA: Diagnosis not present

## 2014-01-19 DIAGNOSIS — H3532 Exudative age-related macular degeneration: Secondary | ICD-10-CM | POA: Diagnosis not present

## 2014-01-25 ENCOUNTER — Encounter (HOSPITAL_COMMUNITY): Payer: Self-pay | Admitting: Emergency Medicine

## 2014-01-25 ENCOUNTER — Emergency Department (INDEPENDENT_AMBULATORY_CARE_PROVIDER_SITE_OTHER)
Admission: EM | Admit: 2014-01-25 | Discharge: 2014-01-25 | Disposition: A | Discharge status: Home / Self Care | Payer: Medicare Other | Source: Home / Self Care | Attending: Emergency Medicine | Admitting: Emergency Medicine

## 2014-01-25 DIAGNOSIS — J40 Bronchitis, not specified as acute or chronic: Secondary | ICD-10-CM | POA: Diagnosis not present

## 2014-01-25 MED ORDER — PREDNISONE 20 MG PO TABS: 40.0000 mg | ORAL_TABLET | Freq: Every day | ORAL | Status: DC

## 2014-01-25 MED ORDER — LEVOFLOXACIN 500 MG PO TABS: 500.0000 mg | ORAL_TABLET | Freq: Every day | ORAL | Status: DC

## 2014-01-25 NOTE — ED Notes (Signed)
Pt here for follow up of bronchitis.  States completed all meds.  Pt is c/o acute on set of feeling worse last night into this morning.  Feels feverish.   Denies vomiting and diarrhea.

## 2014-01-25 NOTE — ED Notes (Signed)
Also c/o ears still being stopped up but having no pain.  Mild sore throat.  Mw,cma

## 2014-01-25 NOTE — Discharge Instructions (Signed)
Your EKG is normal. Take Levaquin 1 pill daily for 7 days. Take prednisone 40mg  daily for 5 days. It is okay to continue mucinex and loratadine.  Follow up with your PCP as scheduled on 12/14. If you develop worsening shortness of breath, chest pain, or are just getting worse, please go to the emergency room.

## 2014-01-25 NOTE — ED Provider Notes (Signed)
CSN: [id number]     Arrival date & time 01/25/14  4680 History   First MD Initiated Contact with Patient 01/25/14 0912     Chief Complaint  Patient presents with  . Follow-up   (Consider location/radiation/quality/duration/timing/severity/associated sxs/prior Treatment) HPI He is an 80 year old man here with his son for follow-up of bronchitis. He was initially seen at his primary care office on November 23. At that time he was prescribed amoxicillin for cough and chest congestion. He did not improve, so he was seen at the urgent care on November 28. At that time a chest x-ray was done that showed bronchitis and possible early interstitial pneumonia. He is prescribed minocycline. He states that he was improving, until last night. He finished his antibiotics Sunday night. Last night, he describes recurrence of chest tightness and cough. He also reports feeling cold, but denies fever or chills. No nausea or vomiting. No nasal discharge or congestion. No diaphoresis.  Past Medical History  Diagnosis Date  . Actinic keratosis   . ARTHRITIS   . Macular degeneration     L>R vision loss, legally blind  . Restless leg syndrome   . BPH (benign prostatic hyperplasia)   . HYPERTENSION   . GLUCOMA   . GERD (gastroesophageal reflux disease)   . DIABETES MELLITUS, TYPE II    Past Surgical History  Procedure Laterality Date  . No past surgeries     History reviewed. No pertinent family history. History  Substance Use Topics  . Smoking status: Never Smoker   . Smokeless tobacco: Not on file  . Alcohol Use: No    Review of Systems  Constitutional: Negative for fever and chills.  HENT: Negative for congestion, rhinorrhea, sinus pressure, sore throat and trouble swallowing.   Respiratory: Positive for cough, chest tightness and shortness of breath.   Cardiovascular: Negative for chest pain.  Gastrointestinal: Negative for nausea and vomiting.  Musculoskeletal: Negative for myalgias.   Neurological: Negative for headaches.    Allergies  Review of patient's allergies indicates no known allergies.  Home Medications   Prior to Admission medications   Medication Sig Start Date End Date Taking? Authorizing Provider  aspirin 81 MG tablet Take 81 mg by mouth daily.   Yes Historical Provider, MD  beta carotene w/minerals (OCUVITE) tablet Take 1 tablet by mouth daily.   Yes Historical Provider, MD  clonazePAM (KLONOPIN) 0.5 MG tablet TAKE ONE TABLET BY MOUTH AT BEDTIME 09/13/13  Yes Rowe Clack, MD  doxazosin (CARDURA) 4 MG tablet Take 1 tablet (4 mg total) by mouth daily. 08/16/13  Yes Rowe Clack, MD  esomeprazole (NEXIUM) 40 MG packet Take 40 mg by mouth daily before breakfast. 07/29/13  Yes Rowe Clack, MD  finasteride (PROSCAR) 5 MG tablet TAKE ONE TABLET BY MOUTH ONCE DAILY 01/11/14  Yes Rowe Clack, MD  glimepiride (AMARYL) 2 MG tablet TAKE ONE TABLET BY MOUTH ONCE DAILY BEFORE  BREAKFAST 01/11/14  Yes Rowe Clack, MD  glucose blood (PRODIGY NO CODING BLOOD GLUC) test strip 1 each by Other route daily. Use as instructed 05/04/12  Yes Rowe Clack, MD  lisinopril (PRINIVIL,ZESTRIL) 2.5 MG tablet TAKE ONE TABLET BY MOUTH ONCE DAILY 01/11/14  Yes Rowe Clack, MD  Loratadine 10 MG CAPS Take 10 mg by mouth 2 (two) times daily.   Yes Historical Provider, MD  PRODIGY LANCETS 26G MISC 1 each by Does not apply route daily. Dx code 250.00 05/04/12  Yes Rowe Clack, MD  tamsulosin (FLOMAX) 0.4 MG CAPS capsule Take 1 capsule (0.4 mg total) by mouth daily after supper. HOLD until further notice 02/23/13  Yes Rowe Clack, MD  timolol (TIMOPTIC-XR) 0.5 % ophthalmic gel-forming Place 1 drop into both eyes daily.    Yes Historical Provider, MD  diphenhydramine-acetaminophen (TYLENOL PM) 25-500 MG TABS Take 1 tablet by mouth at bedtime as needed.      Historical Provider, MD  erythromycin ophthalmic ointment 5 X a day into lower eye sac  bilaterally 01/10/14   Hendricks Limes, MD  levofloxacin (LEVAQUIN) 500 MG tablet Take 1 tablet (500 mg total) by mouth daily. 01/25/14   Melony Overly, MD  NON FORMULARY Mega Red take 1 by mouth daily     Historical Provider, MD  predniSONE (DELTASONE) 20 MG tablet Take 2 tablets (40 mg total) by mouth daily. 01/25/14   Melony Overly, MD   BP 116/55 mmHg  Pulse 86  Temp(Src) 98.6 F (37 C) (Oral)  Resp 18  SpO2 95% Physical Exam  Constitutional: He is oriented to person, place, and time. He appears well-developed and well-nourished. No distress.  HENT:  Head: Normocephalic and atraumatic.  Neck: Neck supple.  Cardiovascular: Normal rate, regular rhythm and normal heart sounds.   No murmur heard. Pulmonary/Chest: Effort normal. No respiratory distress. He has no wheezes. He has no rales.  Mild tachypnea.  Lymphadenopathy:    He has no cervical adenopathy.  Neurological: He is alert and oriented to person, place, and time.    ED Course  ED EKG  Date/Time: 01/25/2014 9:48 AM Performed by: Melony Overly Authorized by: Melony Overly Interpreted by ED physician Comparison: compared with previous ECG  Similar to previous ECG Rhythm: sinus rhythm Rate: normal QRS axis: normal Conduction: conduction normal ST Segments: ST segments normal T Waves: T waves normal Clinical impression: normal ECG   (including critical care time) Labs Review Labs Reviewed - No data to display  Imaging Review No results found.   MDM   1. Bronchitis    This is likely a relapse from his previous episode of bronchitis. We'll treat with Levaquin 500 mg daily for 7 days and prednisone 40 mg daily for 5 days. Reviewed warning signs to go to the ER as in after visit summary. Follow-up with PCP as scheduled on the 14th.    Melony Overly, MD 01/25/14 [phone]

## 2014-01-31 ENCOUNTER — Ambulatory Visit (INDEPENDENT_AMBULATORY_CARE_PROVIDER_SITE_OTHER): Payer: Medicare Other | Admitting: Internal Medicine

## 2014-01-31 VITALS — BP 120/70 | HR 82 | Temp 98.5°F | Wt 195.0 lb

## 2014-01-31 DIAGNOSIS — J209 Acute bronchitis, unspecified: Secondary | ICD-10-CM | POA: Diagnosis not present

## 2014-01-31 DIAGNOSIS — I10 Essential (primary) hypertension: Secondary | ICD-10-CM | POA: Diagnosis not present

## 2014-01-31 NOTE — Progress Notes (Signed)
Pre visit review using our clinic review tool, if applicable. No additional management support is needed unless otherwise documented below in the visit note. 

## 2014-01-31 NOTE — Progress Notes (Signed)
   Subjective:    Patient ID: Timothy Snyder, male    DOB: 05/26/1927, 80 y.o.   MRN: [id number]  HPI   F/u Bronchitis    Per ED note 12/8: "This is likely a relapse from his previous episode of bronchitis. We'll treat with Levaquin 500 mg daily for 7 days and prednisone 40 mg daily for 5 days. Reviewed warning signs to go to the ER as in after visit summary. Follow-up with PCP as scheduled on the 14th."  Doing better   Review of Systems  Constitutional: Positive for chills. Negative for appetite change, fatigue and unexpected weight change.  HENT: Negative for congestion, nosebleeds, sneezing, sore throat and trouble swallowing.   Eyes: Positive for visual disturbance. Negative for itching.  Respiratory: Negative for cough, chest tightness, shortness of breath and wheezing.   Cardiovascular: Negative for chest pain, palpitations and leg swelling.  Gastrointestinal: Negative for nausea, diarrhea, blood in stool and abdominal distention.  Genitourinary: Negative for frequency and hematuria.  Musculoskeletal: Negative for back pain, joint swelling, gait problem and neck pain.  Skin: Negative for rash.  Neurological: Negative for dizziness, tremors, speech difficulty and weakness.  Psychiatric/Behavioral: Negative for sleep disturbance, dysphoric mood and agitation. The patient is not nervous/anxious.        Objective:   Physical Exam  Constitutional: He is oriented to person, place, and time. He appears well-developed. No distress.  NAD Blind   HENT:  Mouth/Throat: Oropharynx is clear and moist.  Eyes: Conjunctivae are normal. Pupils are equal, round, and reactive to light.  Neck: Normal range of motion. No JVD present. No thyromegaly present.  Cardiovascular: Normal rate, regular rhythm, normal heart sounds and intact distal pulses.  Exam reveals no gallop and no friction rub.   No murmur heard. Pulmonary/Chest: Effort normal and breath sounds normal. No respiratory  distress. He has no wheezes. He has no rales. He exhibits no tenderness.  Abdominal: Soft. Bowel sounds are normal. He exhibits no distension and no mass. There is no tenderness. There is no rebound and no guarding.  Musculoskeletal: Normal range of motion. He exhibits no edema or tenderness.  Lymphadenopathy:    He has no cervical adenopathy.  Neurological: He is alert and oriented to person, place, and time. He has normal reflexes. No cranial nerve deficit. He exhibits normal muscle tone. He displays a negative Romberg sign. Coordination and gait normal.  No meningeal signs  Skin: Skin is warm and dry. No rash noted.  Psychiatric: He has a normal mood and affect. His behavior is normal. Judgment and thought content normal.      CXR IMPRESSION: Bilaterally increased interstitial markings may be related to the patient's smoking history. Superimposed acute bronchitis or early interstitial pneumonia is suspected. There is evidence of previous granulomatous infection. Follow-up films following anticipated antibiotic therapy are recommended to assure clearing. Chest CT scanning may be ultimately needed to more fully evaluate the pulmonary parenchyma.   Electronically Signed  By: David Martinique  On: 01/14/2014 11:10     Assessment & Plan:

## 2014-01-31 NOTE — Assessment & Plan Note (Signed)
Continue with current prescription therapy as reflected on the Med list.  

## 2014-01-31 NOTE — Assessment & Plan Note (Signed)
Better Finished Levaquin

## 2014-02-01 ENCOUNTER — Telehealth: Payer: Self-pay | Admitting: Internal Medicine

## 2014-02-01 NOTE — Telephone Encounter (Signed)
emmi emailed °

## 2014-03-29 ENCOUNTER — Other Ambulatory Visit: Payer: Self-pay | Admitting: Internal Medicine

## 2014-04-04 NOTE — Telephone Encounter (Signed)
rx faxed to pharmacy

## 2014-04-12 ENCOUNTER — Ambulatory Visit: Payer: Medicare Other | Admitting: Internal Medicine

## 2014-04-13 ENCOUNTER — Ambulatory Visit (INDEPENDENT_AMBULATORY_CARE_PROVIDER_SITE_OTHER): Payer: PPO | Admitting: Internal Medicine

## 2014-04-13 ENCOUNTER — Other Ambulatory Visit (INDEPENDENT_AMBULATORY_CARE_PROVIDER_SITE_OTHER): Payer: PPO

## 2014-04-13 ENCOUNTER — Encounter: Payer: Self-pay | Admitting: Internal Medicine

## 2014-04-13 VITALS — BP 120/58 | HR 72 | Temp 97.5°F | Resp 18 | Wt 194.0 lb

## 2014-04-13 DIAGNOSIS — I10 Essential (primary) hypertension: Secondary | ICD-10-CM

## 2014-04-13 DIAGNOSIS — N400 Enlarged prostate without lower urinary tract symptoms: Secondary | ICD-10-CM

## 2014-04-13 DIAGNOSIS — Z23 Encounter for immunization: Secondary | ICD-10-CM

## 2014-04-13 DIAGNOSIS — E119 Type 2 diabetes mellitus without complications: Secondary | ICD-10-CM

## 2014-04-13 LAB — MICROALBUMIN / CREATININE URINE RATIO
CREATININE, U: 105.3 mg/dL
MICROALB/CREAT RATIO: 0.7 mg/g (ref 0.0–30.0)
Microalb, Ur: <0.7 mg/dL (ref 0.0–1.9)

## 2014-04-13 LAB — BASIC METABOLIC PANEL
BUN: 21 mg/dL (ref 6–23)
CHLORIDE: 105 meq/L (ref 96–112)
CO2: 30 mEq/L (ref 19–32)
Calcium: 9.2 mg/dL (ref 8.4–10.5)
Creatinine, Ser: 1.23 mg/dL (ref 0.40–1.50)
GFR: 59.48 mL/min — ABNORMAL LOW (ref >60.00)
Glucose, Bld: 107 mg/dL — ABNORMAL HIGH (ref 70–99)
Potassium: 4.4 mEq/L (ref 3.5–5.1)
Sodium: 138 mEq/L (ref 135–145)

## 2014-04-13 LAB — HEMOGLOBIN A1C: HEMOGLOBIN A1C: 6.1 % (ref 4.6–6.5)

## 2014-04-13 MED ORDER — FINASTERIDE 5 MG PO TABS: 5.0000 mg | ORAL_TABLET | Freq: Every day | ORAL | Status: DC

## 2014-04-13 MED ORDER — GLIMEPIRIDE 2 MG PO TABS: 2.0000 mg | ORAL_TABLET | Freq: Every day | ORAL | Status: DC

## 2014-04-13 NOTE — Progress Notes (Signed)
Subjective:    Patient ID: Timothy Snyder, male    DOB: 22-Jun-1927, 81 y.o.   MRN: [id number]  HPI  Patient here for followup - reviewed interval events and current concerns  Past Medical History  Diagnosis Date  . Actinic keratosis   . ARTHRITIS   . Macular degeneration     L>R vision loss, legally blind  . Restless leg syndrome   . BPH (benign prostatic hyperplasia)   . HYPERTENSION   . GLUCOMA   . GERD (gastroesophageal reflux disease)   . DIABETES MELLITUS, TYPE II     Review of Systems  Constitutional: Negative for fatigue and unexpected weight change.  Respiratory: Negative for cough and shortness of breath.   Cardiovascular: Negative for chest pain and leg swelling.  Genitourinary: Positive for frequency. Negative for urgency, enuresis and difficulty urinating. Decreased urine volume: intermittent.       Objective:    Physical Exam  Constitutional: He appears well-developed and well-nourished. No distress.  Cardiovascular: Normal rate, regular rhythm and normal heart sounds.   No murmur heard. Pulmonary/Chest: Effort normal and breath sounds normal. No respiratory distress.    BP 120/58 mmHg  Pulse 72  Temp(Src) 97.5 F (36.4 C) (Oral)  Resp 18  Wt 194 lb (87.998 kg)  SpO2 96% Wt Readings from Last 3 Encounters:  04/13/14 194 lb (87.998 kg)  01/31/14 195 lb (88.451 kg)  01/10/14 199 lb (90.266 kg)    Lab Results  Component Value Date   WBC 8.9 02/23/2013   HGB 15.5 02/23/2013   HCT 44.7 02/23/2013   PLT 207.0 02/23/2013   GLUCOSE 97 02/23/2013   CHOL 116 10/13/2013   TRIG 86.0 10/13/2013   HDL 23.60* 10/13/2013   LDLDIRECT 93.1 09/29/2012   LDLCALC 75 10/13/2013   ALT 25 02/23/2013   AST 20 02/23/2013   NA 139 02/23/2013   K 3.9 02/23/2013   CL 103 02/23/2013   CREATININE 1.3 02/23/2013   BUN 18 02/23/2013   CO2 28 02/23/2013   TSH 2.36 10/01/2011   PSA 1.87 03/31/2012   HGBA1C 6.3 10/13/2013   MICROALBUR 0.6 10/13/2013    No  results found.     Assessment & Plan:   Problem List Items Addressed This Visit    BPH without obstruction/lower urinary tract symptoms    Doxazosin (Cardura) no longer on $4 formulary so changed to finasteride (Proscar) 03/2012 - symptoms not completely controlled  added Flomax which helps but is costly Given symptoms, pt/son willing to continue same combination  Lab Results  Component Value Date   PSA 1.87 03/31/2012   PSA 0.9 12/23/2008        Relevant Medications   finasteride (PROSCAR) 5 MG tablet   Diabetes type 2, controlled - Primary    Well controlled by hx - check a1c q26mo to monitor On ACEI, last LDL<100 - q6-53mo eye exams with optho The current medical regimen is effective;  continue present plan and medications.  Lab Results  Component Value Date   HGBA1C 6.3 10/13/2013        Relevant Medications   glimepiride (AMARYL) tablet   Other Relevant Orders   Hemoglobin H4R   Basic metabolic panel   Microalbumin / creatinine urine ratio   Essential hypertension    BP Readings from Last 3 Encounters:  04/13/14 120/58  01/31/14 120/70  01/25/14 116/55   The current medical regimen is effective;  continue present plan and medications. On ACEI  Gwendolyn Grant, MD

## 2014-04-13 NOTE — Assessment & Plan Note (Signed)
BP Readings from Last 3 Encounters:  04/13/14 120/58  01/31/14 120/70  01/25/14 116/55   The current medical regimen is effective;  continue present plan and medications. On ACEI

## 2014-04-13 NOTE — Assessment & Plan Note (Signed)
Doxazosin (Cardura) no longer on $4 formulary so changed to finasteride (Proscar) 03/2012 - symptoms not completely controlled  added Flomax which helps but is costly Given symptoms, pt/son willing to continue same combination  Lab Results  Component Value Date   PSA 1.87 03/31/2012   PSA 0.9 12/23/2008

## 2014-04-13 NOTE — Patient Instructions (Addendum)
It was good to see you today.  We have reviewed your prior records including labs and tests today  Prevnar 13, second pneumonia vaccine, updated today  Test(s) ordered today. Your results will be released to [facility] (or called to you) after review, usually within 72hours after test completion. If any changes need to be made, you will be notified at that same time.  Medications reviewed and updated, no changes recommended at this time. Refill on medication(s) as discussed today.  Please schedule followup in 6 months for annual wellness exam and labs, call sooner if problems.  Diabetes and Standards of Medical Care Diabetes is complicated. You may find that your diabetes team includes a dietitian, nurse, diabetes educator, eye doctor, and more. To help everyone know what is going on and to help you get the care you deserve, the following schedule of care was developed to help keep you on track. Below are the tests, exams, vaccines, medicines, education, and plans you will need. HbA1c test This test shows how well you have controlled your glucose over the past 2-3 months. It is used to see if your diabetes management plan needs to be adjusted.   It is performed at least 2 times a year if you are meeting treatment goals.  It is performed 4 times a year if therapy has changed or if you are not meeting treatment goals. Blood pressure test  This test is performed at every routine medical visit. The goal is less than 140/90 mm Hg for most people, but 130/80 mm Hg in some cases. Ask your health care provider about your goal. Dental exam  Follow up with the dentist regularly. Eye exam  If you are diagnosed with type 1 diabetes as a child, get an exam upon reaching the age of 53 years or older and have had diabetes for 3-5 years. Yearly eye exams are recommended after that initial eye exam.  If you are diagnosed with type 1 diabetes as an adult, get an exam within 5 years of diagnosis and then  yearly.  If you are diagnosed with type 2 diabetes, get an exam as soon as possible after the diagnosis and then yearly. Foot care exam  Visual foot exams are performed at every routine medical visit. The exams check for cuts, injuries, or other problems with the feet.  A comprehensive foot exam should be done yearly. This includes visual inspection as well as assessing foot pulses and testing for loss of sensation.  Check your feet nightly for cuts, injuries, or other problems with your feet. Tell your health care provider if anything is not healing. Kidney function test (urine microalbumin)  This test is performed once a year.  Type 1 diabetes: The first test is performed 5 years after diagnosis.  Type 2 diabetes: The first test is performed at the time of diagnosis.  A serum creatinine and estimated glomerular filtration rate (eGFR) test is done once a year to assess the level of chronic kidney disease (CKD), if present. Lipid profile (cholesterol, HDL, LDL, triglycerides)  Performed every 5 years for most people.  The goal for LDL is less than 100 mg/dL. If you are at high risk, the goal is less than 70 mg/dL.  The goal for HDL is 40 mg/dL-50 mg/dL for men and 50 mg/dL-60 mg/dL for women. An HDL cholesterol of 60 mg/dL or higher gives some protection against heart disease.  The goal for triglycerides is less than 150 mg/dL. Influenza vaccine, pneumococcal vaccine, and hepatitis  B vaccine  The influenza vaccine is recommended yearly.  It is recommended that people with diabetes who are over 28 years old get the pneumonia vaccine. In some cases, two separate shots may be given. Ask your health care provider if your pneumonia vaccination is up to date.  The hepatitis B vaccine is also recommended for adults with diabetes. Diabetes self-management education  Education is recommended at diagnosis and ongoing as needed. Treatment plan  Your treatment plan is reviewed at every  medical visit. Document Released: 12/02/2008 Document Revised: 06/21/2013 Document Reviewed: 07/07/2012 [facility] Patient Information 2015 [place], [place]. This information is not intended to replace advice given to you by your health care provider. Make sure you discuss any questions you have with your health care provider.

## 2014-04-13 NOTE — Progress Notes (Signed)
Pre visit review using our clinic review tool, if applicable. No additional management support is needed unless otherwise documented below in the visit note. 

## 2014-04-13 NOTE — Assessment & Plan Note (Signed)
Well controlled by hx - check a1c q56mo to monitor On ACEI, last LDL<100 - q6-67mo eye exams with optho The current medical regimen is effective;  continue present plan and medications.  Lab Results  Component Value Date   HGBA1C 6.3 10/13/2013

## 2014-08-18 ENCOUNTER — Encounter: Payer: Self-pay | Admitting: Internal Medicine

## 2014-08-18 NOTE — Telephone Encounter (Signed)
Patient is requesting to transfer to [place].  Please advise.

## 2014-09-12 ENCOUNTER — Encounter: Payer: Self-pay | Admitting: Internal Medicine

## 2014-09-12 MED ORDER — DOXAZOSIN MESYLATE 4 MG PO TABS: 4.0000 mg | ORAL_TABLET | Freq: Every day | ORAL | Status: DC

## 2014-09-13 ENCOUNTER — Encounter: Payer: Self-pay | Admitting: Internal Medicine

## 2014-09-20 ENCOUNTER — Inpatient Hospital Stay (HOSPITAL_COMMUNITY)
Admission: EM | Admit: 2014-09-20 | Discharge: 2014-09-22 | DRG: 247 | Disposition: A | Discharge status: Home / Self Care | Payer: PPO | Attending: Internal Medicine | Admitting: Internal Medicine

## 2014-09-20 ENCOUNTER — Other Ambulatory Visit (INDEPENDENT_AMBULATORY_CARE_PROVIDER_SITE_OTHER): Payer: PPO

## 2014-09-20 ENCOUNTER — Encounter (HOSPITAL_COMMUNITY): Payer: Self-pay | Admitting: *Deleted

## 2014-09-20 ENCOUNTER — Other Ambulatory Visit: Payer: PPO

## 2014-09-20 ENCOUNTER — Encounter: Payer: Self-pay | Admitting: Family

## 2014-09-20 ENCOUNTER — Emergency Department (HOSPITAL_COMMUNITY): Payer: PPO

## 2014-09-20 ENCOUNTER — Ambulatory Visit (INDEPENDENT_AMBULATORY_CARE_PROVIDER_SITE_OTHER): Payer: PPO | Admitting: Family

## 2014-09-20 VITALS — BP 118/70 | HR 68 | Temp 97.6°F | Resp 20 | Ht 68.0 in | Wt 199.4 lb

## 2014-09-20 DIAGNOSIS — I10 Essential (primary) hypertension: Secondary | ICD-10-CM | POA: Diagnosis present

## 2014-09-20 DIAGNOSIS — N400 Enlarged prostate without lower urinary tract symptoms: Secondary | ICD-10-CM | POA: Diagnosis present

## 2014-09-20 DIAGNOSIS — R600 Localized edema: Secondary | ICD-10-CM | POA: Diagnosis not present

## 2014-09-20 DIAGNOSIS — I2510 Atherosclerotic heart disease of native coronary artery without angina pectoris: Secondary | ICD-10-CM | POA: Diagnosis present

## 2014-09-20 DIAGNOSIS — E785 Hyperlipidemia, unspecified: Secondary | ICD-10-CM | POA: Diagnosis present

## 2014-09-20 DIAGNOSIS — I493 Ventricular premature depolarization: Secondary | ICD-10-CM | POA: Diagnosis present

## 2014-09-20 DIAGNOSIS — H409 Unspecified glaucoma: Secondary | ICD-10-CM | POA: Diagnosis present

## 2014-09-20 DIAGNOSIS — Z7982 Long term (current) use of aspirin: Secondary | ICD-10-CM

## 2014-09-20 DIAGNOSIS — N183 Chronic kidney disease, stage 3 (moderate): Secondary | ICD-10-CM | POA: Diagnosis present

## 2014-09-20 DIAGNOSIS — I200 Unstable angina: Secondary | ICD-10-CM

## 2014-09-20 DIAGNOSIS — R0789 Other chest pain: Secondary | ICD-10-CM | POA: Insufficient documentation

## 2014-09-20 DIAGNOSIS — I214 Non-ST elevation (NSTEMI) myocardial infarction: Secondary | ICD-10-CM | POA: Diagnosis present

## 2014-09-20 DIAGNOSIS — E1122 Type 2 diabetes mellitus with diabetic chronic kidney disease: Secondary | ICD-10-CM | POA: Diagnosis present

## 2014-09-20 DIAGNOSIS — E118 Type 2 diabetes mellitus with unspecified complications: Secondary | ICD-10-CM

## 2014-09-20 DIAGNOSIS — H548 Legal blindness, as defined in USA: Secondary | ICD-10-CM

## 2014-09-20 DIAGNOSIS — R079 Chest pain, unspecified: Secondary | ICD-10-CM | POA: Insufficient documentation

## 2014-09-20 DIAGNOSIS — H3530 Unspecified macular degeneration: Secondary | ICD-10-CM | POA: Diagnosis present

## 2014-09-20 DIAGNOSIS — Z79899 Other long term (current) drug therapy: Secondary | ICD-10-CM

## 2014-09-20 DIAGNOSIS — I129 Hypertensive chronic kidney disease with stage 1 through stage 4 chronic kidney disease, or unspecified chronic kidney disease: Secondary | ICD-10-CM | POA: Diagnosis present

## 2014-09-20 DIAGNOSIS — Z955 Presence of coronary angioplasty implant and graft: Secondary | ICD-10-CM

## 2014-09-20 DIAGNOSIS — K219 Gastro-esophageal reflux disease without esophagitis: Secondary | ICD-10-CM | POA: Diagnosis present

## 2014-09-20 DIAGNOSIS — I25110 Atherosclerotic heart disease of native coronary artery with unstable angina pectoris: Secondary | ICD-10-CM | POA: Diagnosis not present

## 2014-09-20 HISTORY — DX: Atherosclerotic heart disease of native coronary artery without angina pectoris: I25.10

## 2014-09-20 LAB — BASIC METABOLIC PANEL
Anion gap: 7 (ref 5–15)
BUN: 18 mg/dL (ref 6–23)
BUN: 17 mg/dL (ref 6–20)
CALCIUM: 8.9 mg/dL (ref 8.4–10.5)
CO2: 29 mEq/L (ref 19–32)
CO2: 25 mmol/L (ref 22–32)
CREATININE: 1.23 mg/dL (ref 0.40–1.50)
CREATININE: 1.3 mg/dL — AB (ref 0.61–1.24)
Calcium: 8.5 mg/dL — ABNORMAL LOW (ref 8.9–10.3)
Chloride: 104 mEq/L (ref 96–112)
Chloride: 105 mmol/L (ref 101–111)
GFR calc non Af Amer: 48 mL/min — ABNORMAL LOW (ref >60)
GFR, EST AFRICAN AMERICAN: 56 mL/min — AB (ref >60)
GFR: 59.42 mL/min — ABNORMAL LOW (ref >60.00)
Glucose, Bld: 121 mg/dL — ABNORMAL HIGH (ref 70–99)
Glucose, Bld: 240 mg/dL — ABNORMAL HIGH (ref 65–99)
POTASSIUM: 3.9 mmol/L (ref 3.5–5.1)
Potassium: 3.9 mEq/L (ref 3.5–5.1)
SODIUM: 139 meq/L (ref 135–145)
SODIUM: 137 mmol/L (ref 135–145)

## 2014-09-20 LAB — MRSA PCR SCREENING: MRSA BY PCR: NEGATIVE

## 2014-09-20 LAB — BRAIN NATRIURETIC PEPTIDE: B Natriuretic Peptide: 233.8 pg/mL — ABNORMAL HIGH (ref 0.0–100.0)

## 2014-09-20 LAB — GLUCOSE, CAPILLARY: Glucose-Capillary: 123 mg/dL — ABNORMAL HIGH (ref 65–99)

## 2014-09-20 LAB — I-STAT TROPONIN, ED: TROPONIN I, POC: 0.17 ng/mL — AB (ref 0.00–0.08)

## 2014-09-20 LAB — CBC
HCT: 39.3 % (ref 39.0–52.0)
Hemoglobin: 13.9 g/dL (ref 13.0–17.0)
MCH: 30.9 pg (ref 26.0–34.0)
MCHC: 35.4 g/dL (ref 30.0–36.0)
MCV: 87.3 fL (ref 78.0–100.0)
Platelets: 120 10*3/uL — ABNORMAL LOW (ref 150–400)
RBC: 4.5 MIL/uL (ref 4.22–5.81)
RDW: 13.5 % (ref 11.5–15.5)
WBC: 5.4 10*3/uL (ref 4.0–10.5)

## 2014-09-20 LAB — CREATININE KINASE MB: CK MB: 3.5 ng/mL (ref 0.3–4.0)

## 2014-09-20 LAB — TROPONIN I
TNIDX: 0.18 ug/L — AB (ref 0.00–0.06)
Troponin I: 0.17 ng/mL — ABNORMAL HIGH (ref <0.031)

## 2014-09-20 MED ORDER — ASPIRIN 81 MG PO CHEW
81.0000 mg | CHEWABLE_TABLET | ORAL | Status: AC
  Administered 2014-09-21: 81 mg via ORAL
  Filled 2014-09-20: qty 1

## 2014-09-20 MED ORDER — ASPIRIN EC 81 MG PO TBEC
81.0000 mg | DELAYED_RELEASE_TABLET | Freq: Every day | ORAL | Status: DC
  Administered 2014-09-22: 10:00:00 81 mg via ORAL
  Filled 2014-09-20 (×2): qty 1

## 2014-09-20 MED ORDER — FINASTERIDE 5 MG PO TABS
5.0000 mg | ORAL_TABLET | Freq: Every day | ORAL | Status: DC
  Administered 2014-09-22: 10:00:00 5 mg via ORAL
  Filled 2014-09-20 (×2): qty 1

## 2014-09-20 MED ORDER — NITROGLYCERIN 0.4 MG SL SUBL: 0.4000 mg | SUBLINGUAL_TABLET | SUBLINGUAL | Status: DC | PRN

## 2014-09-20 MED ORDER — NITROGLYCERIN IN D5W 200-5 MCG/ML-% IV SOLN
10.0000 ug/min | INTRAVENOUS | Status: DC
  Administered 2014-09-20: 10 ug/min via INTRAVENOUS
  Filled 2014-09-20: qty 250

## 2014-09-20 MED ORDER — CARVEDILOL 3.125 MG PO TABS
3.1250 mg | ORAL_TABLET | Freq: Two times a day (BID) | ORAL | Status: DC
  Administered 2014-09-21 – 2014-09-22 (×2): 3.125 mg via ORAL
  Filled 2014-09-20 (×4): qty 1

## 2014-09-20 MED ORDER — SODIUM CHLORIDE 0.9 % IV SOLN
INTRAVENOUS | Status: DC
  Administered 2014-09-21: 06:00:00 via INTRAVENOUS

## 2014-09-20 MED ORDER — ASPIRIN EC 81 MG PO TBEC
243.0000 mg | DELAYED_RELEASE_TABLET | Freq: Once | ORAL | Status: AC
  Administered 2014-09-20: 243 mg via ORAL
  Filled 2014-09-20: qty 3

## 2014-09-20 MED ORDER — FUROSEMIDE 10 MG/ML IJ SOLN
20.0000 mg | Freq: Once | INTRAMUSCULAR | Status: AC
  Administered 2014-09-20: 20 mg via INTRAVENOUS
  Filled 2014-09-20: qty 2

## 2014-09-20 MED ORDER — HEPARIN (PORCINE) IN NACL 100-0.45 UNIT/ML-% IJ SOLN
1200.0000 [IU]/h | INTRAMUSCULAR | Status: DC
  Administered 2014-09-20: 1200 [IU]/h via INTRAVENOUS
  Filled 2014-09-20 (×2): qty 250

## 2014-09-20 MED ORDER — HEPARIN BOLUS VIA INFUSION
4000.0000 [IU] | Freq: Once | INTRAVENOUS | Status: AC
  Administered 2014-09-20: 4000 [IU] via INTRAVENOUS
  Filled 2014-09-20: qty 4000

## 2014-09-20 MED ORDER — DOXAZOSIN MESYLATE 2 MG PO TABS
2.0000 mg | ORAL_TABLET | Freq: Every day | ORAL | Status: DC
  Administered 2014-09-22: 2 mg via ORAL
  Filled 2014-09-20 (×2): qty 1

## 2014-09-20 MED ORDER — TIMOLOL MALEATE 0.5 % OP SOLG
1.0000 [drp] | Freq: Every day | OPHTHALMIC | Status: DC
  Filled 2014-09-20: qty 5

## 2014-09-20 MED ORDER — SODIUM CHLORIDE 0.9 % IJ SOLN
3.0000 mL | Freq: Two times a day (BID) | INTRAMUSCULAR | Status: DC
  Administered 2014-09-20: 3 mL via INTRAVENOUS

## 2014-09-20 MED ORDER — ONDANSETRON HCL 4 MG/2ML IJ SOLN: 4.0000 mg | Freq: Four times a day (QID) | INTRAMUSCULAR | Status: DC | PRN

## 2014-09-20 MED ORDER — SODIUM CHLORIDE 0.9 % IJ SOLN: 3.0000 mL | INTRAMUSCULAR | Status: DC | PRN

## 2014-09-20 MED ORDER — LORATADINE 10 MG PO TABS
10.0000 mg | ORAL_TABLET | Freq: Two times a day (BID) | ORAL | Status: DC
  Administered 2014-09-20 – 2014-09-22 (×3): 10 mg via ORAL
  Filled 2014-09-20 (×5): qty 1

## 2014-09-20 MED ORDER — SODIUM CHLORIDE 0.9 % IV SOLN: 250.0000 mL | INTRAVENOUS | Status: DC | PRN

## 2014-09-20 MED ORDER — ESOMEPRAZOLE MAGNESIUM 40 MG PO CPDR
40.0000 mg | DELAYED_RELEASE_CAPSULE | Freq: Every day | ORAL | Status: DC
  Administered 2014-09-22: 40 mg via ORAL
  Filled 2014-09-20 (×4): qty 1

## 2014-09-20 MED ORDER — GLIMEPIRIDE 2 MG PO TABS
2.0000 mg | ORAL_TABLET | Freq: Every day | ORAL | Status: DC
  Administered 2014-09-22: 2 mg via ORAL
  Filled 2014-09-20 (×4): qty 1

## 2014-09-20 MED ORDER — ACETAMINOPHEN 325 MG PO TABS
650.0000 mg | ORAL_TABLET | ORAL | Status: DC | PRN
  Administered 2014-09-21: 650 mg via ORAL
  Filled 2014-09-20: qty 2

## 2014-09-20 MED ORDER — ATORVASTATIN CALCIUM 80 MG PO TABS
80.0000 mg | ORAL_TABLET | Freq: Every day | ORAL | Status: DC
  Administered 2014-09-21: 80 mg via ORAL
  Filled 2014-09-20 (×2): qty 1

## 2014-09-20 NOTE — H&P (Signed)
Patient ID: LEROI HAQUE MRN: [id number], DOB/AGE: [birth year]   Admit date: 09/20/2014   Primary Physician: Gwendolyn Grant, MD Primary Cardiologist: New (had Stress test in our office 2015)  Pt. Profile:  81 y/o male with no h/o CAD but with multiple cardiac risk factors including HTN, DM and DLD presenting to ED with exertion chest pain and dyspnea.     Problem List  Past Medical History  Diagnosis Date  . Actinic keratosis   . ARTHRITIS   . Macular degeneration     L>R vision loss, legally blind  . Restless leg syndrome   . BPH (benign prostatic hyperplasia)   . HYPERTENSION   . GLUCOMA   . GERD (gastroesophageal reflux disease)   . DIABETES MELLITUS, TYPE II     Past Surgical History  Procedure Laterality Date  . No past surgeries       Allergies  Allergies  Allergen Reactions  . Brinzolamide-Brimonidine Other (See Comments)  . Other Other (See Comments)    Several other eye medications - intollerant    HPI  81 y/o male with no h/o CAD but with multiple cardiac risk factors including HTN, DM and DLD presenting to ED with chest pain.  He had an ETT in our office 03/2013 that was negative for ischemia. LVF is unknown. He has not been seen in our office since then. He is also legally blind due to MD. He lives alone but he has a son and daughter that live locally.  He presents to the [facility] ED with complaint of recent chest pain and dyspnea. He first notice symptoms 5 days ago. He noted resting symptoms while at home. He describes intermittent left sided chest pressure radiating to his left arm. He had recurrent symptoms yesterday while attending a [employer] [facility] [employer]. He had recurrent pain and dyspnea with exertion. No n/v, syncope/ near syncope. He also notes severe gas. Symptoms were relived with rest. EMS was called and it was recommended that he go to the ED but he refused. His son picked him up from camp yesterday and made an appt with his  PCP this morning. They checked a troponin that was abnormal at 0.18. He was then sent to the [facility] ER for further evaluation. A second POC troponin in the ED was also abnormal at 0.17.  EKGs show SR with occasional PVCs, low voltage QRS complexes and nonspecific ST abnormalities. BMP shows SCr of 1.3 which is his baseline. He has been started on IV heparin and IV nitro in the ED and is now chest pain free.     Home Medications  Prior to Admission medications   Medication Sig Start Date End Date Taking? Authorizing Provider  aspirin 81 MG tablet Take 81 mg by mouth daily.    Historical Provider, MD  beta carotene w/minerals (OCUVITE) tablet Take 1 tablet by mouth daily.    Historical Provider, MD  clonazePAM (KLONOPIN) 0.5 MG tablet Take 1 tablet (0.5 mg total) by mouth at bedtime. 04/04/14   Rowe Clack, MD  diphenhydramine-acetaminophen (TYLENOL PM) 25-500 MG TABS Take 1 tablet by mouth at bedtime as needed.      Historical Provider, MD  doxazosin (CARDURA) 4 MG tablet Take 1 tablet (4 mg total) by mouth daily. 09/12/14   Rowe Clack, MD  erythromycin ophthalmic ointment 5 X a day into lower eye sac bilaterally 01/10/14   Hendricks Limes, MD  esomeprazole ([place]) 40 MG packet  Take 40 mg by mouth daily before breakfast. 07/29/13   Rowe Clack, MD  finasteride (PROSCAR) 5 MG tablet Take 1 tablet (5 mg total) by mouth daily. 04/13/14   Rowe Clack, MD  glimepiride (AMARYL) 2 MG tablet Take 1 tablet (2 mg total) by mouth daily with breakfast. 04/13/14   Rowe Clack, MD  glucose blood (PRODIGY NO CODING BLOOD GLUC) test strip 1 each by Other route daily. Use as instructed 05/04/12   Rowe Clack, MD  lisinopril (PRINIVIL,ZESTRIL) 2.5 MG tablet TAKE ONE TABLET BY MOUTH ONCE DAILY 01/11/14   Rowe Clack, MD  Loratadine 10 MG CAPS Take 10 mg by mouth 2 (two) times daily.    Historical Provider, MD  NON FORMULARY Mega Red take 1 by mouth daily     Historical  Provider, MD  PRODIGY LANCETS 26G MISC 1 each by Does not apply route daily. Dx code 250.00 05/04/12   Rowe Clack, MD  tamsulosin (FLOMAX) 0.4 MG CAPS capsule Take 1 capsule (0.4 mg total) by mouth daily after supper. HOLD until further notice 02/23/13   Rowe Clack, MD  timolol (TIMOPTIC-XR) 0.5 % ophthalmic gel-forming Place 1 drop into both eyes daily.     Historical Provider, MD    Family History  Family History  Problem Relation Age of Onset  . Coronary artery disease Mother 68    MI    Social History  History   Social History  . Marital Status: Widowed    Spouse Name: N/A  . Number of Children: N/A  . Years of Education: N/A   Occupational History  . Not on file.   Social History Main Topics  . Smoking status: Never Smoker   . Smokeless tobacco: Not on file  . Alcohol Use: No  . Drug Use: No  . Sexual Activity: Not on file   Other Topics Concern  . Not on file   Social History Narrative   Single, widowed 06/2006. Moved to [facility] from [place] 06/2009 to be near son/dtr. Lives alone & no driving, legally blind due to macular degeneration     Review of Systems General:  No chills, fever, night sweats or weight changes.  Cardiovascular:  No chest pain, dyspnea on exertion, edema, orthopnea, palpitations, paroxysmal nocturnal dyspnea. Dermatological: No rash, lesions/masses Respiratory: No cough, dyspnea Urologic: No hematuria, dysuria Abdominal:   No nausea, vomiting, diarrhea, bright red blood per rectum, melena, or hematemesis Neurologic:  No visual changes, wkns, changes in mental status. All other systems reviewed and are otherwise negative except as noted above.  Physical Exam  Blood pressure 126/60, pulse 83, temperature 98 F (36.7 C), temperature source Oral, resp. rate 18, height 5\' 8"  (1.727 m), weight 199 lb 3.2 oz (90.357 kg), SpO2 97 %.  General: Pleasant, NAD, blind Psych: Normal affect. Neuro: Alert and oriented X 3. Moves all  extremities spontaneously. HEENT: Normal  Neck: Supple without bruits or JVD. Lungs:  Resp regular and unlabored, CTA. Heart: distant heart sounds, RRR no s3, s4, or murmurs. Abdomen: Soft, non-tender, non-distended, BS + x 4.  Extremities: No clubbing, or cyanosis. 1+ bilateral LE edema. DP/PT/Radials 2+ and equal bilaterally.  Labs  Troponin [facility] Test)  Recent Labs  09/20/14 1440  TROPIPOC 0.17*    Recent Labs  09/20/14 1320  CKMB 3.5   Lab Results  Component Value Date   WBC 5.4 09/20/2014   HGB 13.9 09/20/2014   HCT 39.3 09/20/2014  MCV 87.3 09/20/2014   PLT 120* 09/20/2014     Recent Labs Lab 09/20/14 1430  NA 137  K 3.9  CL 105  CO2 25  BUN 17  CREATININE 1.30*  CALCIUM 8.5*  GLUCOSE 240*   Lab Results  Component Value Date   CHOL 116 10/13/2013   HDL 23.60* 10/13/2013   LDLCALC 75 10/13/2013   TRIG 86.0 10/13/2013   No results found for: DDIMER   Radiology/Studies  Dg Chest 2 View  09/20/2014   CLINICAL DATA:  Chest pain and shortness of breath for 4 days  EXAM: CHEST  2 VIEW  COMPARISON:  January 14, 2014  FINDINGS: There is a calcified granuloma in the lateral left base. Interstitium is mildly prominent without edema or consolidation. Heart size and pulmonary vascularity are within normal limits. No adenopathy. There is mild degenerative change in the thoracic spine.  IMPRESSION: Granuloma left base, calcified and stable. No edema or consolidation. Mild generalized interstitial prominence is a stable finding, probably reflecting chronic inflammatory type change.   Electronically Signed   By: Lowella Grip III M.D.   On: 09/20/2014 15:12    ECG  SR with occasional PVCs, low voltage anterior QRS complexes and TWIs   ASSESSMENT AND PLAN  Active Problems:   Unstable angina  1. Unstable Angina/NSTEMI: recent symptomatolgy concerning for [place]. Initial troponin abnormal at 0.18. EKG is abnormal with TWIs. He is chest pain free on IV  heparin and IV nitro. HR and BP are well controlled. Will admit to telemetry. Continue to cycle cardiac enzymes x 3 to assess trend. Obtain a 2D echo. NPO at midnight for s LHC in the am. Initiate statin therapy and will check FLP and LFTs in the am. BB if HR and BP can tolerate.    2. HTN: BP is well controlled. He is on lisinopril as an OP.   3. LE edema   Signed, Lyda Jester, PA-C 09/20/2014, 4:21 PM  Patient seen and examined with Lyda Jester PA-C. We discussed all aspects of the encounter. I agree with the assessment and plan as stated above.   Patient with evidence of ACS/NSTEMI with ECG concerning for proximal LAD disease. Currently pain free on IV NTG and heparin. LE edema concerning for systolic dysfunction. Will admit to SDU. Plan cath tomorrow am or sooner if develops recurrent pain. Cath procedure discussed in detail with patient and his son.   Ladena Jacquez,MD 5:23 PM

## 2014-09-20 NOTE — ED Notes (Signed)
MD at bedside. 

## 2014-09-20 NOTE — Patient Instructions (Addendum)
Thank you for choosing [facility].  Summary/Instructions:  Please stop by the lab on the basement level of the building for your blood work. Your results will be released to [place] (or called to you) after review, usually within 72 hours after test completion. If any changes need to be made, you will be notified at that same time.  If your symptoms worsen or fail to improve, please contact our office for further instruction, or in case of emergency go directly to the emergency room at the closest medical facility.   Shortness of Breath Shortness of breath means you have trouble breathing. It could also mean that you have a medical problem. You should get immediate medical care for shortness of breath. CAUSES   Not enough oxygen in the air such as with high altitudes or a smoke-filled room.  Certain lung diseases, infections, or problems.  Heart disease or conditions, such as angina or heart failure.  Low red blood cells (anemia).  Poor physical fitness, which can cause shortness of breath when you exercise.  Chest or back injuries or stiffness.  Being overweight.  Smoking.  Anxiety, which can make you feel like you are not getting enough air. DIAGNOSIS  Serious medical problems can often be found during your physical exam. Tests may also be done to determine why you are having shortness of breath. Tests may include:  Chest X-rays.  Lung function tests.  Blood tests.  An electrocardiogram (ECG).  An ambulatory electrocardiogram. An ambulatory ECG records your heartbeat patterns over a 24-hour period.  Exercise testing.  A transthoracic echocardiogram (TTE). During echocardiography, sound waves are used to evaluate how blood flows through your heart.  A transesophageal echocardiogram (TEE).  Imaging scans. Your health care provider may not be able to find a cause for your shortness of breath after your exam. In this case, it is important to have a follow-up  exam with your health care provider as directed.  TREATMENT  Treatment for shortness of breath depends on the cause of your symptoms and can vary greatly. HOME CARE INSTRUCTIONS   Do not smoke. Smoking is a common cause of shortness of breath. If you smoke, ask for help to quit.  Avoid being around chemicals or things that may bother your breathing, such as paint fumes and dust.  Rest as needed. Slowly resume your usual activities.  If medicines were prescribed, take them as directed for the full length of time directed. This includes oxygen and any inhaled medicines.  Keep all follow-up appointments as directed by your health care provider. SEEK MEDICAL CARE IF:   Your condition does not improve in the time expected.  You have a hard time doing your normal activities even with rest.  You have any new symptoms. SEEK IMMEDIATE MEDICAL CARE IF:   Your shortness of breath gets worse.  You feel light-headed, faint, or develop a cough not controlled with medicines.  You start coughing up blood.  You have pain with breathing.  You have chest pain or pain in your arms, shoulders, or abdomen.  You have a fever.  You are unable to walk up stairs or exercise the way you normally do. MAKE SURE YOU:  Understand these instructions.  Will watch your condition.  Will get help right away if you are not doing well or get worse. Document Released: 10/30/2000 Document Revised: 02/09/2013 Document Reviewed: 04/22/2011 [facility] Patient Information 2015 [place], [place]. This information is not intended to replace advice given to you by your  health care provider. Make sure you discuss any questions you have with your health care provider.

## 2014-09-20 NOTE — Addendum Note (Signed)
Addended by: Mauricio Po D on: 09/20/2014 01:11 PM   Modules accepted: Orders

## 2014-09-20 NOTE — Assessment & Plan Note (Addendum)
Chest tightness and shortness of breath concerning for NSTEMI or angina given the symptoms. Obtain STAT Troponin and CKMB. In office EKG shows normal sinus rhythm with 1st degree heart block and occasional PVCs. No ST segment elevation noted. Continue baby aspirin. Urgent referral sent to cardiology. Advised to go to the ED if symptoms worsen prior to blood work return.

## 2014-09-20 NOTE — ED Provider Notes (Signed)
CSN: [id number]     Arrival date & time 09/20/14  1414 History   First MD Initiated Contact with Patient 09/20/14 1441     Chief Complaint  Patient presents with  . Shortness of Breath  . Chest Pain     (Consider location/radiation/quality/duration/timing/severity/associated sxs/prior Treatment) The history is provided by the patient.  Timothy Snyder is a 81 y.o. male hx of restless leg syndrome, HTN, GERD, DM, here with shortness of breath, leg swelling. Patient went to a nearby camp recently. Since yesterday had shortness of breath with exertion. Also has some substernal chest pain as well. He states that it is a sense of pressure. Also has increasing bilateral leg swelling. Denies any palpitations. No previous heart problems. Doesn't see cardiology. Patient went to primary care doctor and was sent here for positive troponin.    Past Medical History  Diagnosis Date  . Actinic keratosis   . ARTHRITIS   . Macular degeneration     L>R vision loss, legally blind  . Restless leg syndrome   . BPH (benign prostatic hyperplasia)   . HYPERTENSION   . GLUCOMA   . GERD (gastroesophageal reflux disease)   . DIABETES MELLITUS, TYPE II    Past Surgical History  Procedure Laterality Date  . No past surgeries     History reviewed. No pertinent family history. History  Substance Use Topics  . Smoking status: Never Smoker   . Smokeless tobacco: Not on file  . Alcohol Use: No    Review of Systems  Respiratory: Positive for shortness of breath.   Cardiovascular: Positive for chest pain.  All other systems reviewed and are negative.     Allergies  Review of patient's allergies indicates no known allergies.  Home Medications   Prior to Admission medications   Medication Sig Start Date End Date Taking? Authorizing Provider  aspirin 81 MG tablet Take 81 mg by mouth daily.    Historical Provider, MD  beta carotene w/minerals (OCUVITE) tablet Take 1 tablet by mouth daily.     Historical Provider, MD  clonazePAM (KLONOPIN) 0.5 MG tablet Take 1 tablet (0.5 mg total) by mouth at bedtime. 04/04/14   Rowe Clack, MD  diphenhydramine-acetaminophen (TYLENOL PM) 25-500 MG TABS Take 1 tablet by mouth at bedtime as needed.      Historical Provider, MD  doxazosin (CARDURA) 4 MG tablet Take 1 tablet (4 mg total) by mouth daily. 09/12/14   Rowe Clack, MD  erythromycin ophthalmic ointment 5 X a day into lower eye sac bilaterally 01/10/14   Hendricks Limes, MD  esomeprazole ([place]) 40 MG packet Take 40 mg by mouth daily before breakfast. 07/29/13   Rowe Clack, MD  finasteride (PROSCAR) 5 MG tablet Take 1 tablet (5 mg total) by mouth daily. 04/13/14   Rowe Clack, MD  glimepiride (AMARYL) 2 MG tablet Take 1 tablet (2 mg total) by mouth daily with breakfast. 04/13/14   Rowe Clack, MD  glucose blood (PRODIGY NO CODING BLOOD GLUC) test strip 1 each by Other route daily. Use as instructed 05/04/12   Rowe Clack, MD  lisinopril (PRINIVIL,ZESTRIL) 2.5 MG tablet TAKE ONE TABLET BY MOUTH ONCE DAILY 01/11/14   Rowe Clack, MD  Loratadine 10 MG CAPS Take 10 mg by mouth 2 (two) times daily.    Historical Provider, MD  NON FORMULARY Mega Red take 1 by mouth daily     Historical Provider, MD  PRODIGY LANCETS 26G MISC 1  each by Does not apply route daily. Dx code 250.00 05/04/12   Rowe Clack, MD  tamsulosin (FLOMAX) 0.4 MG CAPS capsule Take 1 capsule (0.4 mg total) by mouth daily after supper. HOLD until further notice 02/23/13   Rowe Clack, MD  timolol (TIMOPTIC-XR) 0.5 % ophthalmic gel-forming Place 1 drop into both eyes daily.     Historical Provider, MD   BP 126/60 mmHg  Pulse 83  Temp(Src) 98 F (36.7 C) (Oral)  Resp 18  Ht 5\' 8"  (1.727 m)  Wt 199 lb 3.2 oz (90.357 kg)  BMI 30.30 kg/m2  SpO2 97% Physical Exam  Constitutional: He is oriented to person, place, and time.  Chronically ill, uncomfortable   HENT:  Head:  Normocephalic.  Mouth/Throat: Oropharynx is clear and moist.  Eyes: Conjunctivae are normal. Pupils are equal, round, and reactive to light.  Neck: Normal range of motion. Neck supple.  Cardiovascular: Normal rate, regular rhythm and normal heart sounds.   Pulmonary/Chest: Effort normal and breath sounds normal. No respiratory distress. He has no wheezes. He has no rales. He exhibits no tenderness.  Abdominal: Soft. Bowel sounds are normal. He exhibits no distension. There is no tenderness. There is no rebound.  Musculoskeletal: Normal range of motion.  1+ edema bilaterally   Neurological: He is alert and oriented to person, place, and time.  Skin: Skin is warm and dry.  Psychiatric: He has a normal mood and affect. His behavior is normal. Judgment and thought content normal.  Nursing note and vitals reviewed.   ED Course  Procedures (including critical care time)  CRITICAL CARE Performed by: Darl Householder, DAVID   Total critical care time: 30 min   Critical care time was exclusive of separately billable procedures and treating other patients.  Critical care was necessary to treat or prevent imminent or life-threatening deterioration.  Critical care was time spent personally by me on the following activities: development of treatment plan with patient and/or surrogate as well as nursing, discussions with consultants, evaluation of patient's response to treatment, examination of patient, obtaining history from patient or surrogate, ordering and performing treatments and interventions, ordering and review of laboratory studies, ordering and review of radiographic studies, pulse oximetry and re-evaluation of patient's condition.   Labs Review Labs Reviewed  CBC - Abnormal; Notable for the following:    Platelets 120 (*)    All other components within normal limits  I-STAT TROPOININ, ED - Abnormal; Notable for the following:    Troponin i, poc 0.17 (*)    All other components within normal  limits  BASIC METABOLIC PANEL  BRAIN NATRIURETIC PEPTIDE  HEPARIN LEVEL (UNFRACTIONATED)    Imaging Review No results found.   EKG Interpretation   Date/Time:  Tuesday September 20 2014 14:52:13 EDT Ventricular Rate:  70 PR Interval:  227 QRS Duration: 94 QT Interval:  418 QTC Calculation: 451 R Axis:   17 Text Interpretation:  Sinus rhythm Ventricular trigeminy Prolonged PR  interval Low voltage, precordial leads Nonspecific T abnrm, anterolateral  leads No significant change since last tracing Confirmed by YAO  MD, DAVID  ([id number]) on 09/20/2014 2:55:24 PM      MDM   Final diagnoses:  None   Deland HINES KLOSS is a 81 y.o. male here with chest pain, positive trop. Still has pain but repeat EKG showed no STEMI. Trop positive 0.17. Started on heparin drip, nitro drip, given ASA. Consulted cardiology to admit for NSTEMI, unstable angina. Also has leg swelling but lungs  are clear. Will get BNP to r/o CHF.      Wandra Arthurs, MD 09/20/14 [phone]

## 2014-09-20 NOTE — ED Notes (Signed)
Pt reports onset last night of sob and swelling to extremities. Pt reports with exertion having chest pain, bilateral arm pain and fatigue. ekg done on arrival. Airway intact.

## 2014-09-20 NOTE — Progress Notes (Signed)
Pre visit review using our clinic review tool, if applicable. No additional management support is needed unless otherwise documented below in the visit note. 

## 2014-09-20 NOTE — Progress Notes (Signed)
Subjective:    Patient ID: Timothy Snyder, male    DOB: 1927-06-15, 81 y.o.   MRN: [id number]  Chief Complaint  Patient presents with  . Follow-up    EMS was called for pt did not go to hospital, having chest tightness SOB, was recommended to come in to get a CPE to make sure things were ok, still has trouble with SOB and chest tightness    HPI:  Timothy Snyder is a 81 y.o. male with a PMH of hypertension, GERD, type 2 diabetes, arthritis, BPH, glaucoma, blindness, and restless leg syndrome who presents today for an acute office visit.   This is a new problem. Associated symptom of chest tightness and shortness of breath has been going on for approximately about 1 day ago. EMS was activated yesterday and patient was evaluated and refused transportation to the hospital. EKG performed by EMS revealed normal sinus rhythm with 1st degree AV block and occasional PVC. EKG tracing was read and independently interpreted by this provider.  Continues to experience the associated symptoms of chest tightness and shortness of breath which is exacerbated by physical activity and exertion. He states he had increased levels of indigestion and gas. Describes the chest pain as achiness and tightness. Does describe pain in both arms and located across his back. He does have increased edema of bilateral extremities. Modifying factors include resting which help the pain and tightness. He currently takes 81 mg of aspirin daily.  No Known Allergies   Current Outpatient Prescriptions on File Prior to Visit  Medication Sig Dispense Refill  . aspirin 81 MG tablet Take 81 mg by mouth daily.    . beta carotene w/minerals (OCUVITE) tablet Take 1 tablet by mouth daily.    . clonazePAM (KLONOPIN) 0.5 MG tablet Take 1 tablet (0.5 mg total) by mouth at bedtime. 90 tablet 1  . diphenhydramine-acetaminophen (TYLENOL PM) 25-500 MG TABS Take 1 tablet by mouth at bedtime as needed.      . doxazosin (CARDURA) 4 MG tablet  Take 1 tablet (4 mg total) by mouth daily. 90 tablet 3  . erythromycin ophthalmic ointment 5 X a day into lower eye sac bilaterally 3.5 g 0  . esomeprazole (NEXIUM) 40 MG packet Take 40 mg by mouth daily before breakfast. 90 each 3  . finasteride (PROSCAR) 5 MG tablet Take 1 tablet (5 mg total) by mouth daily. 90 tablet 3  . glimepiride (AMARYL) 2 MG tablet Take 1 tablet (2 mg total) by mouth daily with breakfast. 90 tablet 3  . glucose blood (PRODIGY NO CODING BLOOD GLUC) test strip 1 each by Other route daily. Use as instructed 100 each 1  . lisinopril (PRINIVIL,ZESTRIL) 2.5 MG tablet TAKE ONE TABLET BY MOUTH ONCE DAILY 90 tablet 3  . Loratadine 10 MG CAPS Take 10 mg by mouth 2 (two) times daily.    . NON FORMULARY Mega Red take 1 by mouth daily     . PRODIGY LANCETS 26G MISC 1 each by Does not apply route daily. Dx code 250.00 100 each 1  . tamsulosin (FLOMAX) 0.4 MG CAPS capsule Take 1 capsule (0.4 mg total) by mouth daily after supper. HOLD until further notice 30 capsule 3  . timolol (TIMOPTIC-XR) 0.5 % ophthalmic gel-forming Place 1 drop into both eyes daily.     . [DISCONTINUED] bimatoprost (LUMIGAN) 0.03 % ophthalmic solution Place 1 drop into both eyes at bedtime.       No current facility-administered medications on  file prior to visit.    Past Medical History  Diagnosis Date  . Actinic keratosis   . ARTHRITIS   . Macular degeneration     L>R vision loss, legally blind  . Restless leg syndrome   . BPH (benign prostatic hyperplasia)   . HYPERTENSION   . GLUCOMA   . GERD (gastroesophageal reflux disease)   . DIABETES MELLITUS, TYPE II     Review of Systems  Constitutional: Negative for fever, chills and diaphoresis.  Respiratory: Positive for chest tightness and shortness of breath.   Cardiovascular: Positive for chest pain and leg swelling. Negative for palpitations.      Objective:    BP 118/70 mmHg  Pulse 68  Temp(Src) 97.6 F (36.4 C) (Oral)  Resp 20  Ht 5\' 8"   (1.727 m)  Wt 199 lb 6.4 oz (90.447 kg)  BMI 30.33 kg/m2  SpO2 92% Nursing note and vital signs reviewed.  Physical Exam  Constitutional: He is oriented to person, place, and time. He appears well-developed and well-nourished. No distress.  Cardiovascular: Normal rate, regular rhythm, normal heart sounds and intact distal pulses.   Pulmonary/Chest: Effort normal and breath sounds normal.  Neurological: He is alert and oriented to person, place, and time.  Skin: Skin is warm and dry.  Psychiatric: He has a normal mood and affect. His behavior is normal. Judgment and thought content normal.       Assessment & Plan:   Problem List Items Addressed This Visit      Other   Chest tightness - Primary    Chest tightness and shortness of breath concerning for NSTEMI or angina given the symptoms. Obtain STAT Troponin and CKMB. In office EKG shows normal sinus rhythm with 1st degree heart block and occasional PVCs. No ST segment elevation noted. Continue baby aspirin. Urgent referral sent to cardiology. Advised to go to the ED if symptoms worsen prior to blood work return.       Relevant Orders   EKG 12-Lead (Completed)   Basic Metabolic Panel (BMET)   CK total and CKMB (cardiac)not at [facility]   Ambulatory referral to Cardiology   Troponin I

## 2014-09-20 NOTE — Progress Notes (Signed)
ANTICOAGULATION CONSULT NOTE - Initial Consult  Pharmacy Consult for Heparin Indication: chest pain/ACS  No Known Allergies  Patient Measurements: Height: 5\' 8"  (172.7 cm) Weight: 199 lb 3.2 oz (90.357 kg) IBW/kg (Calculated) : 68.4 Heparin Dosing Weight: 87 kg  Vital Signs: Temp: 98 F (36.7 C) (08/02 1424) Temp Source: Oral (08/02 1424) BP: 126/60 mmHg (08/02 1424) Pulse Rate: 83 (08/02 1424)  Labs:  Recent Labs  09/20/14 1035 09/20/14 1320 09/20/14 1430  HGB  --   --  13.9  HCT  --   --  39.3  PLT  --   --  120*  CREATININE 1.23  --   --   CKMB  --  3.5  --     Estimated Creatinine Clearance: 47.9 mL/min (by C-G formula based on Cr of 1.23).   Medical History: Past Medical History  Diagnosis Date  . Actinic keratosis   . ARTHRITIS   . Macular degeneration     L>R vision loss, legally blind  . Restless leg syndrome   . BPH (benign prostatic hyperplasia)   . HYPERTENSION   . GLUCOMA   . GERD (gastroesophageal reflux disease)   . DIABETES MELLITUS, TYPE II     Medications:   (Not in a hospital admission) Scheduled:  . aspirin EC  243 mg Oral Once   Infusions:  . nitroGLYCERIN      Assessment: 81yo male with history of HTN, GERD, DM2 and BPH presents with chest tightness and SOB. Pharmacy is consulted to dose heparin for ACS/chest pain. Hgb 13.9, Plt 120, sCr 1.2, CK-MB 3.5, Trop 0.17.  Goal of Therapy:  Heparin level 0.3-0.7 units/ml Monitor platelets by anticoagulation protocol: Yes   Plan:  Give 4000 units bolus x 1 Start heparin infusion at 1200 units/hr Check anti-Xa level in 8 hours and daily while on heparin Continue to monitor H&H and platelets  Andrey Cota. Diona Foley, PharmD Clinical Pharmacist Pager [phone] 09/20/2014,2:59 PM

## 2014-09-20 NOTE — ED Notes (Signed)
Attempted report X1

## 2014-09-21 ENCOUNTER — Inpatient Hospital Stay (HOSPITAL_COMMUNITY): Payer: PPO

## 2014-09-21 ENCOUNTER — Encounter (HOSPITAL_COMMUNITY)
Admission: EM | Disposition: A | Discharge status: Home / Self Care | Payer: Self-pay | Source: Home / Self Care | Attending: Internal Medicine

## 2014-09-21 ENCOUNTER — Encounter (HOSPITAL_COMMUNITY): Payer: Self-pay | Admitting: Cardiology

## 2014-09-21 DIAGNOSIS — I2510 Atherosclerotic heart disease of native coronary artery without angina pectoris: Secondary | ICD-10-CM

## 2014-09-21 HISTORY — PX: CARDIAC CATHETERIZATION: SHX172

## 2014-09-21 LAB — CBC
HEMATOCRIT: 37.6 % — AB (ref 39.0–52.0)
Hemoglobin: 13.2 g/dL (ref 13.0–17.0)
MCH: 31.1 pg (ref 26.0–34.0)
MCHC: 35.1 g/dL (ref 30.0–36.0)
MCV: 88.7 fL (ref 78.0–100.0)
Platelets: 112 10*3/uL — ABNORMAL LOW (ref 150–400)
RBC: 4.24 MIL/uL (ref 4.22–5.81)
RDW: 13.8 % (ref 11.5–15.5)
WBC: 6.3 10*3/uL (ref 4.0–10.5)

## 2014-09-21 LAB — COMPREHENSIVE METABOLIC PANEL
ALBUMIN: 3.4 g/dL — AB (ref 3.5–5.0)
ALT: 27 U/L (ref 17–63)
AST: 30 U/L (ref 15–41)
Alkaline Phosphatase: 52 U/L (ref 38–126)
Anion gap: 6 (ref 5–15)
BILIRUBIN TOTAL: 0.8 mg/dL (ref 0.3–1.2)
BUN: 16 mg/dL (ref 6–20)
CALCIUM: 8.5 mg/dL — AB (ref 8.9–10.3)
CO2: 25 mmol/L (ref 22–32)
Chloride: 108 mmol/L (ref 101–111)
Creatinine, Ser: 1.39 mg/dL — ABNORMAL HIGH (ref 0.61–1.24)
GFR calc non Af Amer: 45 mL/min — ABNORMAL LOW (ref >60)
GFR, EST AFRICAN AMERICAN: 52 mL/min — AB (ref >60)
Glucose, Bld: 126 mg/dL — ABNORMAL HIGH (ref 65–99)
Potassium: 3.8 mmol/L (ref 3.5–5.1)
Sodium: 139 mmol/L (ref 135–145)
Total Protein: 5.6 g/dL — ABNORMAL LOW (ref 6.5–8.1)

## 2014-09-21 LAB — LIPID PANEL
CHOL/HDL RATIO: 5.2 ratio
Cholesterol: 125 mg/dL (ref 0–200)
HDL: 24 mg/dL — AB (ref >40)
LDL CALC: 73 mg/dL (ref 0–99)
Triglycerides: 138 mg/dL (ref <150)
VLDL: 28 mg/dL (ref 0–40)

## 2014-09-21 LAB — HEPARIN LEVEL (UNFRACTIONATED)
HEPARIN UNFRACTIONATED: 0.37 [IU]/mL (ref 0.30–0.70)
HEPARIN UNFRACTIONATED: 0.53 [IU]/mL (ref 0.30–0.70)

## 2014-09-21 LAB — GLUCOSE, CAPILLARY
GLUCOSE-CAPILLARY: 153 mg/dL — AB (ref 65–99)
GLUCOSE-CAPILLARY: 161 mg/dL — AB (ref 65–99)
Glucose-Capillary: 158 mg/dL — ABNORMAL HIGH (ref 65–99)

## 2014-09-21 LAB — TROPONIN I
TROPONIN I: 0.24 ng/mL — AB (ref <0.031)
Troponin I: 0.25 ng/mL — ABNORMAL HIGH (ref <0.031)

## 2014-09-21 LAB — PROTIME-INR
INR: 1.2 (ref 0.00–1.49)
Prothrombin Time: 15.4 seconds — ABNORMAL HIGH (ref 11.6–15.2)

## 2014-09-21 LAB — POCT ACTIVATED CLOTTING TIME: Activated Clotting Time: 564 seconds

## 2014-09-21 SURGERY — LEFT HEART CATH AND CORONARY ANGIOGRAPHY

## 2014-09-21 MED ORDER — VERAPAMIL HCL 2.5 MG/ML IV SOLN
INTRAVENOUS | Status: AC
  Filled 2014-09-21: qty 2

## 2014-09-21 MED ORDER — FENTANYL CITRATE (PF) 100 MCG/2ML IJ SOLN
INTRAMUSCULAR | Status: DC | PRN
  Administered 2014-09-21 (×2): 25 ug via INTRAVENOUS
  Administered 2014-09-21: 50 ug via INTRAVENOUS

## 2014-09-21 MED ORDER — SODIUM CHLORIDE 0.9 % IV SOLN
250.0000 mg | INTRAVENOUS | Status: DC | PRN
  Administered 2014-09-21: 1.75 mg/kg/h via INTRAVENOUS

## 2014-09-21 MED ORDER — BIVALIRUDIN 250 MG IV SOLR
INTRAVENOUS | Status: AC
  Filled 2014-09-21: qty 250

## 2014-09-21 MED ORDER — FENTANYL CITRATE (PF) 100 MCG/2ML IJ SOLN
INTRAMUSCULAR | Status: AC
  Filled 2014-09-21: qty 4

## 2014-09-21 MED ORDER — BIVALIRUDIN BOLUS VIA INFUSION - CUPID
INTRAVENOUS | Status: DC | PRN
  Administered 2014-09-21: 67.35 mg via INTRAVENOUS

## 2014-09-21 MED ORDER — TICAGRELOR 90 MG PO TABS
180.0000 mg | ORAL_TABLET | Freq: Once | ORAL | Status: AC
  Administered 2014-09-21: 21:00:00 180 mg via ORAL
  Filled 2014-09-21: qty 2

## 2014-09-21 MED ORDER — SODIUM CHLORIDE 0.9 % IJ SOLN: 3.0000 mL | INTRAMUSCULAR | Status: DC | PRN

## 2014-09-21 MED ORDER — TICAGRELOR 90 MG PO TABS
ORAL_TABLET | ORAL | Status: AC
  Filled 2014-09-21: qty 1

## 2014-09-21 MED ORDER — HEPARIN (PORCINE) IN NACL 2-0.9 UNIT/ML-% IJ SOLN
INTRAMUSCULAR | Status: AC
  Filled 2014-09-21: qty 1000

## 2014-09-21 MED ORDER — NITROGLYCERIN 1 MG/10 ML FOR IR/CATH LAB
INTRA_ARTERIAL | Status: DC | PRN
  Administered 2014-09-21 (×2): 200 ug via INTRACORONARY

## 2014-09-21 MED ORDER — TICAGRELOR 90 MG PO TABS
90.0000 mg | ORAL_TABLET | Freq: Two times a day (BID) | ORAL | Status: DC
Start: 2014-09-21 — End: 2014-09-21

## 2014-09-21 MED ORDER — TICAGRELOR 90 MG PO TABS
90.0000 mg | ORAL_TABLET | Freq: Two times a day (BID) | ORAL | Status: DC
  Administered 2014-09-22: 90 mg via ORAL
  Filled 2014-09-21: qty 1

## 2014-09-21 MED ORDER — MIDAZOLAM HCL 2 MG/2ML IJ SOLN
INTRAMUSCULAR | Status: AC
  Filled 2014-09-21: qty 4

## 2014-09-21 MED ORDER — NITROGLYCERIN 1 MG/10 ML FOR IR/CATH LAB
INTRA_ARTERIAL | Status: AC
  Filled 2014-09-21: qty 10

## 2014-09-21 MED ORDER — SODIUM CHLORIDE 0.9 % IV SOLN: 250.0000 mL | INTRAVENOUS | Status: DC | PRN

## 2014-09-21 MED ORDER — LIDOCAINE HCL (PF) 1 % IJ SOLN
INTRAMUSCULAR | Status: AC
  Filled 2014-09-21: qty 30

## 2014-09-21 MED ORDER — HEPARIN SODIUM (PORCINE) 1000 UNIT/ML IJ SOLN
INTRAMUSCULAR | Status: AC
  Filled 2014-09-21: qty 1

## 2014-09-21 MED ORDER — MIDAZOLAM HCL 2 MG/2ML IJ SOLN
INTRAMUSCULAR | Status: DC | PRN
Start: 2014-09-21 — End: 2014-09-21
  Administered 2014-09-21 (×2): 1 mg via INTRAVENOUS

## 2014-09-21 MED ORDER — HEPARIN SODIUM (PORCINE) 1000 UNIT/ML IJ SOLN
INTRAMUSCULAR | Status: DC | PRN
  Administered 2014-09-21: 4500 [IU] via INTRAVENOUS

## 2014-09-21 MED ORDER — NITROGLYCERIN 1 MG/10 ML FOR IR/CATH LAB
INTRA_ARTERIAL | Status: DC | PRN
  Administered 2014-09-21: 10:00:00

## 2014-09-21 MED ORDER — LIDOCAINE HCL (PF) 1 % IJ SOLN
INTRAMUSCULAR | Status: DC | PRN
  Administered 2014-09-21: 5 mL via INTRADERMAL

## 2014-09-21 MED ORDER — SODIUM CHLORIDE 0.9 % WEIGHT BASED INFUSION
3.0000 mL/kg/h | INTRAVENOUS | Status: DC
  Administered 2014-09-21: 3 mL/kg/h via INTRAVENOUS

## 2014-09-21 MED ORDER — VERAPAMIL HCL 2.5 MG/ML IV SOLN
INTRAVENOUS | Status: DC | PRN
  Administered 2014-09-21: 09:00:00 via INTRA_ARTERIAL

## 2014-09-21 MED ORDER — SODIUM CHLORIDE 0.9 % IJ SOLN
3.0000 mL | Freq: Two times a day (BID) | INTRAMUSCULAR | Status: DC
  Administered 2014-09-21: 21:00:00 3 mL via INTRAVENOUS

## 2014-09-21 MED ORDER — INSULIN ASPART 100 UNIT/ML ~~LOC~~ SOLN: 0.0000 [IU] | Freq: Three times a day (TID) | SUBCUTANEOUS | Status: DC

## 2014-09-21 SURGICAL SUPPLY — 17 items
BALLN EMERGE MR 2.5X12 (BALLOONS) ×4
BALLN ~~LOC~~ TREK RX 3.5X20 (BALLOONS) ×4
BALLOON EMERGE MR 2.5X12 (BALLOONS) ×2 IMPLANT
BALLOON ~~LOC~~ TREK RX 3.5X20 (BALLOONS) ×2 IMPLANT
CATH INFINITI 5 FR JL3.5 (CATHETERS) ×4 IMPLANT
CATH INFINITI JR4 5F (CATHETERS) ×4 IMPLANT
CATH VISTA GUIDE 6FR XBLAD3.5 (CATHETERS) ×4 IMPLANT
DEVICE RAD COMP TR BAND LRG (VASCULAR PRODUCTS) ×4 IMPLANT
GLIDESHEATH SLEND SS 6F .021 (SHEATH) ×4 IMPLANT
KIT ENCORE 26 ADVANTAGE (KITS) ×4 IMPLANT
KIT HEART LEFT (KITS) ×4 IMPLANT
PACK CARDIAC CATHETERIZATION (CUSTOM PROCEDURE TRAY) ×4 IMPLANT
STENT SYNERGY DES 3X32 (Permanent Stent) ×4 IMPLANT
TRANSDUCER W/STOPCOCK (MISCELLANEOUS) ×4 IMPLANT
TUBING CIL FLEX 10 FLL-RA (TUBING) ×4 IMPLANT
WIRE ASAHI PROWATER 180CM (WIRE) ×4 IMPLANT
WIRE SAFE-T 1.5MM-J .035X260CM (WIRE) ×4 IMPLANT

## 2014-09-21 NOTE — Progress Notes (Signed)
Pt apparently never got loading dose of Brilinta- will order for tonight.  Kerin Ransom PA-C 09/21/2014 7:52 PM

## 2014-09-21 NOTE — Progress Notes (Signed)
Pt's belongings given to pt's son.

## 2014-09-21 NOTE — Progress Notes (Addendum)
UR COMPLETED  

## 2014-09-21 NOTE — Progress Notes (Signed)
ANTICOAGULATION CONSULT NOTE - Follow Up Consult  Pharmacy Consult for Heparin  Indication: chest pain/ACS  Allergies  Allergen Reactions  . Brinzolamide-Brimonidine Other (See Comments)  . Other Other (See Comments)    Several other eye medications - intollerant   Patient Measurements: Height: 5\' 8"  (172.7 cm) Weight: 198 lb (89.812 kg) IBW/kg (Calculated) : 68.4  Vital Signs: Temp: 98.1 F (36.7 C) (08/02 2352) Temp Source: Oral (08/02 2352) BP: 103/47 mmHg (08/03 0000) Pulse Rate: 67 (08/03 0000)  Labs:  Recent Labs  09/20/14 1035 09/20/14 1320 09/20/14 1430 09/20/14 1935 09/20/14 2335  HGB  --   --  13.9  --   --   HCT  --   --  39.3  --   --   PLT  --   --  120*  --   --   HEPARINUNFRC  --   --   --   --  0.53  CREATININE 1.23  --  1.30*  --   --   CKMB  --  3.5  --   --   --   TROPONINI  --   --   --  0.17*  --     Estimated Creatinine Clearance: 45.2 mL/min (by C-G formula based on Cr of 1.3).   Assessment: Initial heparin level is therapeutic at 0.53, plans for cath today  Goal of Therapy:  Heparin level 0.3-0.7 units/ml Monitor platelets by anticoagulation protocol: Yes   Plan:  -Continue heparin at 1200 units/hr -HL with AM labs to confirm -Daily CBC/HL -Monitor for bleeding  Narda Bonds 09/21/2014,12:23 AM

## 2014-09-21 NOTE — Interval H&P Note (Signed)
History and Physical Interval Note:  09/21/2014 8:39 AM  Timothy Snyder  has presented today for surgery, with the diagnosis of cp  The various methods of treatment have been discussed with the patient and family. After consideration of risks, benefits and other options for treatment, the patient has consented to  Procedure(s): Left Heart Cath and Coronary Angiography (N/A) as a surgical intervention .  The patient's history has been reviewed, patient examined, no change in status, stable for surgery.  I have reviewed the patient's chart and labs.  Questions were answered to the patient's satisfaction.   Cath Lab Visit (complete for each Cath Lab visit)  Clinical Evaluation Leading to the Procedure:   ACS: Yes.    Non-ACS:    Anginal Classification: CCS IV  Anti-ischemic medical therapy: Minimal Therapy (1 class of medications)  Non-Invasive Test Results: No non-invasive testing performed  Prior CABG: No previous CABG        Timothy Snyder [facility] 09/21/2014 8:39 AM

## 2014-09-22 ENCOUNTER — Encounter (HOSPITAL_COMMUNITY): Payer: Self-pay | Admitting: Physician Assistant

## 2014-09-22 ENCOUNTER — Ambulatory Visit (HOSPITAL_BASED_OUTPATIENT_CLINIC_OR_DEPARTMENT_OTHER): Payer: PPO

## 2014-09-22 ENCOUNTER — Telehealth: Payer: Self-pay | Admitting: Cardiology

## 2014-09-22 DIAGNOSIS — I2510 Atherosclerotic heart disease of native coronary artery without angina pectoris: Secondary | ICD-10-CM | POA: Diagnosis not present

## 2014-09-22 DIAGNOSIS — I25110 Atherosclerotic heart disease of native coronary artery with unstable angina pectoris: Secondary | ICD-10-CM

## 2014-09-22 LAB — BASIC METABOLIC PANEL
ANION GAP: 9 (ref 5–15)
BUN: 13 mg/dL (ref 6–20)
CO2: 23 mmol/L (ref 22–32)
CREATININE: 1.23 mg/dL (ref 0.61–1.24)
Calcium: 8.7 mg/dL — ABNORMAL LOW (ref 8.9–10.3)
Chloride: 105 mmol/L (ref 101–111)
GFR calc Af Amer: 60 mL/min — ABNORMAL LOW (ref >60)
GFR, EST NON AFRICAN AMERICAN: 52 mL/min — AB (ref >60)
GLUCOSE: 160 mg/dL — AB (ref 65–99)
Potassium: 3.9 mmol/L (ref 3.5–5.1)
Sodium: 137 mmol/L (ref 135–145)

## 2014-09-22 LAB — GLUCOSE, CAPILLARY
GLUCOSE-CAPILLARY: 142 mg/dL — AB (ref 65–99)
GLUCOSE-CAPILLARY: 106 mg/dL — AB (ref 65–99)

## 2014-09-22 LAB — CBC
HCT: 41.4 % (ref 39.0–52.0)
HEMOGLOBIN: 14.4 g/dL (ref 13.0–17.0)
MCH: 30.4 pg (ref 26.0–34.0)
MCHC: 34.8 g/dL (ref 30.0–36.0)
MCV: 87.5 fL (ref 78.0–100.0)
PLATELETS: 122 10*3/uL — AB (ref 150–400)
RBC: 4.73 MIL/uL (ref 4.22–5.81)
RDW: 13.8 % (ref 11.5–15.5)
WBC: 8.5 10*3/uL (ref 4.0–10.5)

## 2014-09-22 MED ORDER — TICAGRELOR 90 MG PO TABS: 90.0000 mg | ORAL_TABLET | Freq: Two times a day (BID) | ORAL | Status: DC

## 2014-09-22 MED ORDER — NITROGLYCERIN 0.4 MG SL SUBL: 0.4000 mg | SUBLINGUAL_TABLET | SUBLINGUAL | Status: DC | PRN

## 2014-09-22 MED ORDER — ANGIOPLASTY BOOK
Freq: Once | Status: AC
  Administered 2014-09-22: 01:00:00
  Filled 2014-09-22: qty 1

## 2014-09-22 MED ORDER — CARVEDILOL 3.125 MG PO TABS: 3.1250 mg | ORAL_TABLET | Freq: Two times a day (BID) | ORAL | Status: DC

## 2014-09-22 NOTE — Discharge Summary (Signed)
Discharge Summary   Patient ID: Timothy Snyder,  MRN: [id number], DOB/AGE: [birth year] 81 y.o.  Admit date: 09/20/2014 Discharge date: 09/22/2014  Primary Care Provider: Gwendolyn Grant Primary Cardiologist: Dr. Martinique  Discharge Diagnoses Principal Problem:   NSTEMI (non-ST elevated myocardial infarction) Active Problems:   Diabetes type 2, controlled   Essential hypertension   GERD (gastroesophageal reflux disease)   Allergies Allergies  Allergen Reactions  . Brinzolamide-Brimonidine Other (See Comments)  . Other Other (See Comments)    Several other eye medications - intollerant    Procedures  Echocardiogram 09/22/2014 LV EF: 55% -  60%  ------------------------------------------------------------------- Indications:   CAD of native vessels 414.01.  ------------------------------------------------------------------- History:  PMH: Unstable angina. NSTEMI. Type 2 diabetes, controlled. Risk factors: Hypertension.  ------------------------------------------------------------------- Study Conclusions  - Left ventricle: The cavity size was normal. Systolic function was normal. The estimated ejection fraction was in the range of 55% to 60%. Mild hypokinesis of the apical myocardium. Features are consistent with a pseudonormal left ventricular filling pattern, with concomitant abnormal relaxation and increased filling pressure (grade 2 diastolic dysfunction). - Left atrium: The atrium was moderately to severely dilated.         Cardiac catheterization 09/21/2014 Conclusion     Prox RCA to Dist RCA lesion, 10% stenosed.  Mid Cx to Dist Cx lesion, 10% stenosed.  Prox LAD to Mid LAD lesion, 50% stenosed.  Mid LAD lesion, 99% stenosed. There is a 0% residual stenosis post intervention.  A drug-eluting stent was placed.  1. Single vessel obstructive CAD 2. Successful PCI of the proximal to mid LAD with a DES.  Recommendation: DAPT for one  year. Check Echo for LV function. Possible DC tomorrow if no complications.       Hospital Course  The patient is a 81 year old male with no past history of CAD, however multiple cardiac risk factors include HTN, DM, HLDwho presented [facility] with chest pain. He also has stage III CKD with Cr 1.3. His last workup include ETT in February 2015 which was negative for ischemia. He has not been seen in our office since then. Serial troponin to trend up to 0.25.   Patient underwent cardiac catheterization on 09/21/2014 which showed single vessel dx, 10% prox to distal RCA lesion, 10% mid to distal LCx, 50% prox to mid LAD lesion, 99% mid LAD lesion treated with DES (STENT SYNERGY DES 3X32). LV gram was not done due to his history of CKD. Post cath, he was placed on aspirin and Brilinta. He was also placed on low-dose Coreg.  He was seen in the morning of 09/22/2014, at which time he was doing well without significant discomfort. He has refused to take Lipitor despite our recommendation. Emphasis has been placed on importance of compliance with DAPT. Echo was obtained prior to discharge which showed normal EF 19-41%, grade 2 diastolic dysfunction, moderately to severely dilated LA. He is deemed stable to discharge from cardiac perspective. I have arranged one week TOC follow-up with Dr. Martinique after discharge.   Discharge Vitals Blood pressure 128/52, pulse 75, temperature 97.7 F (36.5 C), temperature source Oral, resp. rate 24, height 5\' 8"  (1.727 m), weight 197 lb 15.6 oz (89.8 kg), SpO2 99 %.  Filed Weights   09/20/14 1432 09/20/14 1837 09/22/14 0011  Weight: 199 lb 3.2 oz (90.357 kg) 198 lb (89.812 kg) 197 lb 15.6 oz (89.8 kg)    Labs  CBC  Recent Labs  09/21/14 0055 09/22/14 0306  WBC 6.3 8.5  HGB 13.2 14.4  HCT 37.6* 41.4  MCV 88.7 87.5  PLT 112* 309*   Basic Metabolic Panel  Recent Labs  09/21/14 0055 09/22/14 0306  NA 139 137  K 3.8 3.9  CL 108 105    CO2 25 23  GLUCOSE 126* 160*  BUN 16 13  CREATININE 1.39* 1.23  CALCIUM 8.5* 8.7*   Liver Function Tests  Recent Labs  09/21/14 0055  AST 30  ALT 27  ALKPHOS 52  BILITOT 0.8  PROT 5.6*  ALBUMIN 3.4*   Cardiac Enzymes  Recent Labs  09/20/14 1320 09/20/14 1935 09/21/14 0055 09/21/14 0703  CKMB 3.5  --   --   --   TROPONINI  --  0.17* 0.24* 0.25*   Fasting Lipid Panel  Recent Labs  09/21/14 0055  CHOL 125  HDL 24*  LDLCALC 73  TRIG 138  CHOLHDL 5.2    Disposition  Pt is being discharged home today in good condition.  Follow-up Plans & Appointments      Follow-up Information    Follow up with Richardson Dopp, PA-C On 09/29/2014.   Specialties:  Physician Assistant, Radiology, Interventional Cardiology   Why:  8:00am   Contact information:   [address] [place] [address] [phone]       Discharge Medications    Medication List    STOP taking these medications        erythromycin ophthalmic ointment     lisinopril 2.5 MG tablet  Commonly known as:  PRINIVIL,ZESTRIL      TAKE these medications        aspirin 81 MG tablet  Take 81 mg by mouth daily.     beta carotene w/minerals tablet  Take 1 tablet by mouth at bedtime.     carvedilol 3.125 MG tablet  Commonly known as:  COREG  Take 1 tablet (3.125 mg total) by mouth 2 (two) times daily with a meal.     clonazePAM 0.5 MG tablet  Commonly known as:  KLONOPIN  Take 1 tablet (0.5 mg total) by mouth at bedtime.     diphenhydramine-acetaminophen 25-500 MG Tabs  Commonly known as:  TYLENOL PM  Take 1 tablet by mouth at bedtime.     doxazosin 4 MG tablet  Commonly known as:  CARDURA  Take 1 tablet (4 mg total) by mouth daily.     esomeprazole 40 MG packet  Commonly known as:  NEXIUM  Take 40 mg by mouth daily before breakfast.     finasteride 5 MG tablet  Commonly known as:  PROSCAR  Take 1 tablet (5 mg total) by mouth daily.     glimepiride 2 MG tablet   Commonly known as:  AMARYL  Take 1 tablet (2 mg total) by mouth daily with breakfast.     glucose blood test strip  Commonly known as:  PRODIGY NO CODING BLOOD GLUC  1 each by Other route daily. Use as instructed     KRILL OIL PO  Take 1 tablet by mouth daily.     Loratadine 10 MG Caps  Take 10 mg by mouth 2 (two) times daily.     nitroGLYCERIN 0.4 MG SL tablet  Commonly known as:  NITROSTAT  Place 1 tablet (0.4 mg total) under the tongue every 5 (five) minutes x 3 doses as needed for chest pain.     OMEGA-3 COMPLEX PO  Take 1 capsule by mouth daily.  PRODIGY LANCETS 26G Misc  1 each by Does not apply route daily. Dx code 250.00     tamsulosin 0.4 MG Caps capsule  Commonly known as:  FLOMAX  Take 1 capsule (0.4 mg total) by mouth daily after supper. HOLD until further notice     ticagrelor 90 MG Tabs tablet  Commonly known as:  BRILINTA  Take 1 tablet (90 mg total) by mouth 2 (two) times daily.     timolol 0.5 % ophthalmic gel-forming  Commonly known as:  TIMOPTIC-XR  Place 1 drop into both eyes daily.         Duration of Discharge Encounter   Greater than 30 minutes including physician time.  Hilbert Corrigan PA-C Pager: [phone] 09/22/2014, 1:24 PM

## 2014-09-22 NOTE — Progress Notes (Signed)
  Echocardiogram 2D Echocardiogram has been performed.  Timothy Snyder 09/22/2014, 11:52 AM

## 2014-09-22 NOTE — Progress Notes (Signed)
CARDIAC REHAB PHASE I   PRE:  Rate/Rhythm: 62 SR with PVCs    BP: sitting 142/62    SaO2:   MODE:  Ambulation: 450 ft   POST:  Rate/Rhythm: 94 SR with PVCs    BP: sitting 143/61     SaO2:   Tolerated well, no c/o. Ed completed with pt and son. Voiced understanding and requests his name be sent to [place]. Understands importance of Brilinta. [phone]   [name] CES, ACSM 09/22/2014 8:55 AM

## 2014-09-22 NOTE — Telephone Encounter (Signed)
New message    TCM appt on  8/11  @ 8 am with Richardson Dopp per [facility].

## 2014-09-22 NOTE — Progress Notes (Signed)
Pt and son given discharge instructions and questions were answered. Brilinta prescription given. Pt and son are aware to stop taking lisinopril.  No chest pain or sob at dischg.  Rt radial site unremarkable and VSS.  All belongings with the son.

## 2014-09-22 NOTE — Care Management Important Message (Signed)
Important Message  Patient Details  Name: Timothy Snyder MRN: [id number] Date of Birth: 20-Aug-1927   Medicare Important Message Given:  Yes-second notification given    Delorse Lek 09/22/2014, 2:27 PM

## 2014-09-22 NOTE — Progress Notes (Signed)
     SUBJECTIVE: No chest pain or SOB.   BP 121/48 mmHg  Pulse 75  Temp(Src) 98.1 F (36.7 C) (Oral)  Resp 21  Ht 5\' 8"  (1.727 m)  Wt 197 lb 15.6 oz (89.8 kg)  BMI 30.11 kg/m2  SpO2 100%  Intake/Output Summary (Last 24 hours) at 09/22/14 0824 Last data filed at 09/21/14 1900  Gross per 24 hour  Intake 2296.42 ml  Output   1200 ml  Net 1096.42 ml    PHYSICAL EXAM General: Well developed, well nourished, in no acute distress. Alert and oriented x 3.  Psych:  Good affect, responds appropriately Neck: No JVD. No masses noted.  Lungs: Clear bilaterally with no wheezes or rhonci noted.  Heart: RRR with no murmurs noted. Abdomen: Bowel sounds are present. Soft, non-tender.  Extremities: No lower extremity edema.   LABS: Basic Metabolic Panel:  Recent Labs  09/21/14 0055 09/22/14 0306  NA 139 137  K 3.8 3.9  CL 108 105  CO2 25 23  GLUCOSE 126* 160*  BUN 16 13  CREATININE 1.39* 1.23  CALCIUM 8.5* 8.7*   CBC:  Recent Labs  09/21/14 0055 09/22/14 0306  WBC 6.3 8.5  HGB 13.2 14.4  HCT 37.6* 41.4  MCV 88.7 87.5  PLT 112* 122*   Cardiac Enzymes:  Recent Labs  09/20/14 1320 09/20/14 1935 09/21/14 0055 09/21/14 0703  CKMB 3.5  --   --   --   TROPONINI  --  0.17* 0.24* 0.25*   Fasting Lipid Panel:  Recent Labs  09/21/14 0055  CHOL 125  HDL 24*  LDLCALC 73  TRIG 138  CHOLHDL 5.2    Current Meds: . aspirin EC  81 mg Oral Daily  . atorvastatin  80 mg Oral q1800  . carvedilol  3.125 mg Oral BID WC  . doxazosin  2 mg Oral Daily  . esomeprazole  40 mg Oral QAC breakfast  . finasteride  5 mg Oral Daily  . glimepiride  2 mg Oral Q breakfast  . insulin aspart  0-9 Units Subcutaneous TID WC  . loratadine  10 mg Oral BID  . sodium chloride  3 mL Intravenous Q12H  . ticagrelor  90 mg Oral BID  . timolol  1 drop Both Eyes Daily     ASSESSMENT AND PLAN:  1. CAD/NSTEMI: Pt admitted with NSTEMI. Cardiac cath per Dr. Martinique with 99% mid LAD stenosis  treated with DES x 1. Pt is on ASA and Brilinta. Continue Lipitor, beta blocker. Discharge home today. Follow up with Dr. Martinique in 1-2 weeks.  Will need echo before discharge. Ordered 09/20/14.   Timothy Snyder  8/4/20168:24 AM

## 2014-09-22 NOTE — Discharge Instructions (Signed)
Acute Coronary Syndrome  Acute coronary syndrome (ACS) is an urgent problem in which the blood and oxygen supply to the heart is critically deficient. ACS requires hospitalization because one or more coronary arteries may be blocked.  ACS represents a range of conditions including:  · Previous angina that is now unstable, lasts longer, happens at rest, or is more intense.  · A heart attack, with heart muscle cell injury and death.  There are three vital coronary arteries that supply the heart muscle with blood and oxygen so that it can pump blood effectively. If blockages to these arteries develop, blood flow to the heart muscle is reduced. If the heart does not get enough blood, angina may occur as the first warning sign.  SYMPTOMS   · The most common signs of angina include:  ¨ Tightness or squeezing in the chest.  ¨ Feeling of heaviness on the chest.  ¨ Discomfort in the arms, neck, back, or jaw.  ¨ Shortness of breath and nausea.  ¨ Cold, wet skin.  · Angina is usually brought on by physical effort or excitement which increase the oxygen needs of the heart. These states increase the blood flow needs of the heart beyond what can be delivered.  · Other symptoms that are not as common include:  ¨ Fatigue  ¨ Unexplained feelings of nervousness or anxiety  ¨ Weakness  ¨ Diarrhea  · Sometimes, you may not have noticed any symptoms at all but still suffered a cardiac injury.  TREATMENT   · Medicines to help discomfort may include nitroglycerin (nitro) in the form of tablets or a spray for rapid relief, or longer-acting forms such as cream, patches, or capsules. (Be aware that there are many side effects and possible interactions with other drugs).  · Other medicines may be used to help the heart pump better.  · Procedures to open blocked arteries including angioplasty or stent placement to keep the arteries open.  · Open heart surgery may be needed when there are many blockages or they are in critical locations that  are best treated with surgery.  HOME CARE INSTRUCTIONS   · Do not use any tobacco products including cigarettes, chewing tobacco, or electronic cigarettes.  · Take one baby or adult aspirin daily, if your health care provider advises. This helps reduce the risk of a heart attack.  · It is very important that you follow the angina treatment prescribed by your health care provider. Make arrangements for proper follow-up care.  · Eat a heart healthy diet with salt and fat restrictions as advised.  · Regular exercise is good for you as long as it does not cause discomfort. Do not begin any new type of exercise until you check with your health care provider.  · If you are overweight, you should lose weight.  · Try to maintain normal blood lipid levels.  · Keep your blood pressure under control as recommended by your health care provider.  · You should tell your health care provider right away about any increase in the severity or frequency of your chest discomfort or angina attacks. When you have angina, you should stop what you are doing and sit down. This may bring relief in 3 to 5 minutes. If your health care provider has prescribed nitro, take it as directed.  · If your health care provider has given you a follow-up appointment, it is very important to keep that appointment. Not keeping the appointment could result in a chronic or   of breath. °· You feel faint, lightheaded, or pass out. °· Your chest discomfort gets worse. °· You are sweating or experience sudden profound fatigue. °· You do not get relief of your chest pain after 3 doses of nitro. °· Your discomfort lasts longer than 15 minutes. °MAKE SURE YOU:  °· Understand these instructions. °· Will watch your condition. °· Will get help right  away if you are not doing well or get worse. °· Take all medicines as directed by your health care provider. °Document Released: 02/04/2005 Document Revised: 02/09/2013 Document Reviewed: 06/08/2013 °Exit[employer]® Patient Information ©2015 [employer]. This information is not intended to replace advice given to you by your health care provider. Make sure you discuss any questions you have with your health care provider. ° °No driving for 24hours. No lifting over 5 lbs for 1 week. No sexual activity for 1 week. Keep procedure site clean & dry. If you notice increased pain, swelling, bleeding or pus, call/return!  You may shower, but no soaking baths/hot tubs/pools for 1 week.  ° ° °

## 2014-09-23 ENCOUNTER — Telehealth: Payer: Self-pay | Admitting: *Deleted

## 2014-09-23 NOTE — Telephone Encounter (Signed)
Pt was on tcm list d/c 09/22/14 dx: NSTEMI pt is f/u with cardiology dr. Kathlen Mody on 09/29/14 did not make tcm appt with pcp...Timothy Snyder

## 2014-09-23 NOTE — Telephone Encounter (Signed)
Patient contacted regarding discharge from [facility] on 09/22/2014.  Patient understands to follow up with provider Richardson Dopp on 09/29/2014 at [place] [facility]. Patient understands discharge instructions? yes Patient understands medications and regiment? yes Patient understands to bring all medications to this visit? yes  Spoke with Fara Chute he stated patient was doing very well.

## 2014-09-28 NOTE — Progress Notes (Signed)
Cardiology Office Note   Date:  09/29/2014   ID:  Timothy Snyder, DOB [birth year], MRN [id number]  PCP:  Mauricio Po, FNP  Cardiologist:  Dr. Peter Martinique   Electrophysiologist:  n/a  Chief Complaint  Patient presents with  . Hospitalization Follow-up    s/p NSTEMI >> PCI to LAD  . Coronary Artery Disease     History of Present Illness: Timothy Snyder is a 81 y.o. male with a hx of diabetes, HTN, HL, CKD stage III, macular degeneration (blind).  Admitted 8/2-8/4 with a non-STEMI. LHC demonstrated single-vessel disease with 99% mid LAD lesion treated with a Synergy DES. Echocardiogram demonstrated normal LV function. Patient refused statin therapy. He returns for follow-up.  Here with his son.  He denies chest pain, syncope, dizziness, shortness of breath, orthopnea, edema.    Studies/Reports Reviewed Today:  Echo 09/22/14 EF 55-60%, mild HK of the apical myocardium, grade 2 diastolic dysfunction, moderate to severe LAE.  LHC 09/22/14 LAD: Proximal to mid 50%, mid 99% LCx: 10% RCA: 10% PCI: 3 x 32 mm Synergy DES to the mid LAD 1. Single vessel obstructive CAD 2. Successful PCI of the proximal to mid LAD with a DES. Recommendation: DAPT for one year. Check Echo for LV function. Possible DC tomorrow if no complications.   Past Medical History  Diagnosis Date  . Actinic keratosis   . ARTHRITIS   . Macular degeneration     L>R vision loss, legally blind  . Restless leg syndrome   . BPH (benign prostatic hyperplasia)   . HYPERTENSION   . GLUCOMA   . GERD (gastroesophageal reflux disease)   . DIABETES MELLITUS, TYPE II   . CAD (coronary artery disease)     cath 09/21/2014 1v dx, 99% mid LAD treated with DES, 50% prox to mid LAD.    Past Surgical History  Procedure Laterality Date  . No past surgeries    . Cardiac catheterization N/A 09/21/2014    Procedure: Left Heart Cath and Coronary Angiography;  Surgeon: Peter M Martinique, MD;  Location: [facility];   Service: Cardiovascular;  Laterality: N/A;  . Cardiac catheterization  09/21/2014    Procedure: Coronary Stent Intervention;  Surgeon: Peter M Martinique, MD;  Location: [facility];  Service: Cardiovascular;;     Current Outpatient Prescriptions  Medication Sig Dispense Refill  . aspirin 81 MG tablet Take 81 mg by mouth daily.    . beta carotene w/minerals (OCUVITE) tablet Take 1 tablet by mouth at bedtime.     . carvedilol (COREG) 3.125 MG tablet Take 1 tablet (3.125 mg total) by mouth 2 (two) times daily with a meal. 60 tablet 5  . clonazePAM (KLONOPIN) 0.5 MG tablet Take 1 tablet (0.5 mg total) by mouth at bedtime. 90 tablet 1  . diphenhydramine-acetaminophen (TYLENOL PM) 25-500 MG TABS Take 1 tablet by mouth at bedtime.     Marland Kitchen doxazosin (CARDURA) 4 MG tablet Take 1 tablet (4 mg total) by mouth daily. (Patient taking differently: Take 2 mg by mouth daily. ) 90 tablet 3  . esomeprazole (NEXIUM) 40 MG packet Take 40 mg by mouth daily before breakfast. 90 each 3  . finasteride (PROSCAR) 5 MG tablet Take 1 tablet (5 mg total) by mouth daily. 90 tablet 3  . glimepiride (AMARYL) 2 MG tablet Take 1 tablet (2 mg total) by mouth daily with breakfast. 90 tablet 3  . glucose blood (PRODIGY NO CODING BLOOD GLUC) test strip 1 each  by Other route daily. Use as instructed 100 each 1  . KRILL OIL PO Take 1 tablet by mouth daily.    . Loratadine 10 MG CAPS Take 10 mg by mouth 2 (two) times daily.    . nitroGLYCERIN (NITROSTAT) 0.4 MG SL tablet Place 1 tablet (0.4 mg total) under the tongue every 5 (five) minutes x 3 doses as needed for chest pain. 25 tablet 3  . PRODIGY LANCETS 26G MISC 1 each by Does not apply route daily. Dx code 250.00 100 each 1  . ticagrelor (BRILINTA) 90 MG TABS tablet Take 1 tablet (90 mg total) by mouth 2 (two) times daily. 180 tablet 3  . timolol (TIMOPTIC-XR) 0.5 % ophthalmic gel-forming Place 1 drop into both eyes daily.     . [DISCONTINUED] bimatoprost (LUMIGAN) 0.03 %  ophthalmic solution Place 1 drop into both eyes at bedtime.       No current facility-administered medications for this visit.    Allergies:   Brinzolamide-brimonidine and Other    Social History:  The patient  reports that he has never smoked. He does not have any smokeless tobacco history on file. He reports that he does not drink alcohol or use illicit drugs.   Family History:  The patient's family history includes Cancer in his father; Coronary artery disease (age of onset: 48) in his mother; Heart failure in his mother.    ROS:   Please see the history of present illness.   Review of Systems  Cardiovascular: Positive for dyspnea on exertion.  All other systems reviewed and are negative.     PHYSICAL EXAM: VS:  BP 120/52 mmHg  Pulse 64  Ht 5\' 8"  (1.727 m)  Wt 181 lb 3.2 oz (82.192 kg)  BMI 27.56 kg/m2    Wt Readings from Last 3 Encounters:  09/29/14 181 lb 3.2 oz (82.192 kg)  09/22/14 197 lb 15.6 oz (89.8 kg)  09/20/14 199 lb 6.4 oz (90.447 kg)     GEN: Well nourished, well developed, in no acute distress HEENT: normal Neck: no JVD,   no masses Cardiac:  Normal S1/S2, RRR; no murmur ,  no rubs or gallops, no edema;  right wrist without hematoma or mass  Respiratory:  clear to auscultation bilaterally, no wheezing, rhonchi or rales. GI: soft, nontender, nondistended, + BS MS: no deformity or atrophy Skin: warm and dry  Neuro:  CNs II-XII intact, Strength and sensation are intact Psych: Normal affect   EKG:  EKG is ordered today.  It demonstrates:   Sinus rhythm, HR 64, 1st degree AVB, PR 236, PVCs, TWI in V2-5   Recent Labs: 09/20/2014: B Natriuretic Peptide 233.8* 09/21/2014: ALT 27 09/22/2014: BUN 13; Creatinine, Ser 1.23; Hemoglobin 14.4; Platelets 122*; Potassium 3.9; Sodium 137    Lipid Panel    Component Value Date/Time   CHOL 125 09/21/2014 0055   TRIG 138 09/21/2014 0055   TRIG 171 12/23/2008   HDL 24* 09/21/2014 0055   CHOLHDL 5.2 09/21/2014 0055    VLDL 28 09/21/2014 0055   LDLCALC 73 09/21/2014 0055   LDLDIRECT 93.1 09/29/2012 0930      ASSESSMENT AND PLAN:  1. CAD: Status post recent non-STEMI treated with synergy DES to the LAD. LV function normal by echocardiogram post PCI. He is doing well. No further angina.  Continue aspirin, Brilinta, beta blocker. We discussed the importance of dual antiplatelet therapy. We discussed the importance of statin therapy. He is willing to try Pravastatin.  Will start Pravastatin  20 mg QHS. He is not able to attend Cardiac rehabilitation.  He exercises on his own at [facility] facility.  His EF was normal by echo post MI.  He did have some WMA.  However, with advanced age, normal BP and CKD, I do not think we should place him on an ACE inhibitor at this time.  2. Hypertension:  Controlled.   3. Hyperlipidemia:  Start Pravastatin.  Check Lipids and LFTs in 6 weeks.   4. Diabetes: Follow-up with primary care.  5. Chronic Kidney Disease: Obtain follow-up BMET today.      Medication Changes: Current medicines are reviewed at length with the patient today.  Concerns regarding medicines are as outlined above.  The following changes have been made:   Discontinued Medications   DHA-EPA-VITAMIN E (OMEGA-3 COMPLEX PO)    Take 1 capsule by mouth daily.   TAMSULOSIN (FLOMAX) 0.4 MG CAPS CAPSULE    Take 1 capsule (0.4 mg total) by mouth daily after supper. HOLD until further notice   Modified Medications   No medications on file   New Prescriptions   No medications on file    Labs/ tests ordered today include:   No orders of the defined types were placed in this encounter.     Disposition:   FU with Dr. Peter Martinique 6-8 weeks.     Signed, Versie Starks, MHS 09/29/2014 8:08 AM    [facility] [place], [place], [place]  [address] Phone: [phone]; Fax: [phone]

## 2014-09-29 ENCOUNTER — Encounter: Payer: Self-pay | Admitting: Physician Assistant

## 2014-09-29 ENCOUNTER — Ambulatory Visit (INDEPENDENT_AMBULATORY_CARE_PROVIDER_SITE_OTHER): Payer: PPO | Admitting: Physician Assistant

## 2014-09-29 VITALS — BP 120/52 | HR 64 | Ht 68.0 in | Wt 181.2 lb

## 2014-09-29 DIAGNOSIS — I2510 Atherosclerotic heart disease of native coronary artery without angina pectoris: Secondary | ICD-10-CM

## 2014-09-29 DIAGNOSIS — E785 Hyperlipidemia, unspecified: Secondary | ICD-10-CM

## 2014-09-29 DIAGNOSIS — I10 Essential (primary) hypertension: Secondary | ICD-10-CM | POA: Diagnosis not present

## 2014-09-29 DIAGNOSIS — N183 Chronic kidney disease, stage 3 (moderate): Secondary | ICD-10-CM | POA: Diagnosis not present

## 2014-09-29 LAB — BASIC METABOLIC PANEL
BUN: 18 mg/dL (ref 6–23)
CALCIUM: 8.9 mg/dL (ref 8.4–10.5)
CO2: 25 mEq/L (ref 19–32)
Chloride: 107 mEq/L (ref 96–112)
Creatinine, Ser: 1.26 mg/dL (ref 0.40–1.50)
GFR: 57.78 mL/min — AB (ref >60.00)
GLUCOSE: 190 mg/dL — AB (ref 70–99)
POTASSIUM: 3.8 meq/L (ref 3.5–5.1)
SODIUM: 140 meq/L (ref 135–145)

## 2014-09-29 MED ORDER — PRAVASTATIN SODIUM 20 MG PO TABS: 20.0000 mg | ORAL_TABLET | Freq: Every evening | ORAL | Status: DC

## 2014-09-29 NOTE — Patient Instructions (Signed)
Medication Instructions:  1. START PRAVASTATIN 20 MG DAILY AT BED TIME  Labwork: 1. TODAY BMET  2. FASTING CHOLESTEROL PANEL TO BE DONE IN 6-8 WEEKS  Testing/Procedures: NONE  Follow-Up: 6-8 WEEKS WITH DR. Martinique   Any Other Special Instructions Will Be Listed Below (If Applicable).

## 2014-09-30 ENCOUNTER — Encounter: Payer: Self-pay | Admitting: Family

## 2014-09-30 ENCOUNTER — Encounter: Payer: Self-pay | Admitting: Physician Assistant

## 2014-09-30 ENCOUNTER — Telehealth: Payer: Self-pay | Admitting: *Deleted

## 2014-09-30 NOTE — Telephone Encounter (Signed)
lmptcb to go over lab results 

## 2014-10-03 NOTE — Telephone Encounter (Signed)
DPR on file; pt's son gary cb and was given lab results for pt by phone with verbal understanding.

## 2014-10-12 ENCOUNTER — Ambulatory Visit: Payer: PPO | Admitting: Internal Medicine

## 2014-10-13 ENCOUNTER — Ambulatory Visit (INDEPENDENT_AMBULATORY_CARE_PROVIDER_SITE_OTHER): Payer: PPO | Admitting: Family

## 2014-10-13 ENCOUNTER — Encounter: Payer: Self-pay | Admitting: Physician Assistant

## 2014-10-13 ENCOUNTER — Encounter: Payer: Self-pay | Admitting: Family

## 2014-10-13 ENCOUNTER — Other Ambulatory Visit (INDEPENDENT_AMBULATORY_CARE_PROVIDER_SITE_OTHER): Payer: PPO

## 2014-10-13 VITALS — BP 134/62 | HR 69 | Temp 97.7°F | Resp 18 | Ht 68.0 in | Wt 190.0 lb

## 2014-10-13 DIAGNOSIS — Z23 Encounter for immunization: Secondary | ICD-10-CM | POA: Diagnosis not present

## 2014-10-13 DIAGNOSIS — E119 Type 2 diabetes mellitus without complications: Secondary | ICD-10-CM | POA: Diagnosis not present

## 2014-10-13 DIAGNOSIS — I2510 Atherosclerotic heart disease of native coronary artery without angina pectoris: Secondary | ICD-10-CM | POA: Diagnosis not present

## 2014-10-13 LAB — HEMOGLOBIN A1C: Hgb A1c MFr Bld: 6 % (ref 4.6–6.5)

## 2014-10-13 NOTE — Progress Notes (Signed)
Pre visit review using our clinic review tool, if applicable. No additional management support is needed unless otherwise documented below in the visit note. 

## 2014-10-13 NOTE — Assessment & Plan Note (Signed)
Stable and improved since leaving the hospital. Reports no chest pain and has returned to baseline function with activity. Continue to follow up with cardiology as scheduled.

## 2014-10-13 NOTE — Progress Notes (Signed)
Subjective:    Patient ID: Timothy Snyder, male    DOB: 08/18/1927, 81 y.o.   MRN: [id number]  Chief Complaint  Patient presents with  . Follow-up    states he is doing alot better with his heart, had to get a stent put in     HPI:  Timothy Snyder is a 81 y.o. male with a PMH of restless legs, NSTEMI, glaucoma, GERD, hypertension, type 2 diabetes, coronary artery disease, BPH, and arthritis who presents today for an office visit to follow-up.   1.) NSTEMI - Recently seen in the office for chest pain and discomfort and was sent to the ED following an elevated troponin level. He was diagnosed with an NSTEMI and had a stent placed. Since leaving the hospital he has been doing very well. Reports no chest pain and states he is doing fine. He reports he is back to baseline and is able to complete his activities like he was previously.   2.) Type 2 diabetes - Stable with current regimen and currently maintained on glimepiride. Takes the medication as prescribed and denies adverse side effects or hypoglycemic events. Denies numbness and tingling in his extremities.   Lab Results  Component Value Date   HGBA1C 6.0 10/13/2014     Allergies  Allergen Reactions  . Brinzolamide-Brimonidine Other (See Comments)  . Other Other (See Comments)    Several other eye medications - intollerant    Current Outpatient Prescriptions on File Prior to Visit  Medication Sig Dispense Refill  . aspirin 81 MG tablet Take 81 mg by mouth daily.    . beta carotene w/minerals (OCUVITE) tablet Take 1 tablet by mouth at bedtime.     . carvedilol (COREG) 3.125 MG tablet Take 1 tablet (3.125 mg total) by mouth 2 (two) times daily with a meal. 60 tablet 5  . clonazePAM (KLONOPIN) 0.5 MG tablet Take 1 tablet (0.5 mg total) by mouth at bedtime. 90 tablet 1  . diphenhydramine-acetaminophen (TYLENOL PM) 25-500 MG TABS Take 1 tablet by mouth at bedtime.     Marland Kitchen doxazosin (CARDURA) 4 MG tablet Take 1 tablet (4 mg total)  by mouth daily. (Patient taking differently: Take 2 mg by mouth daily. ) 90 tablet 3  . esomeprazole (NEXIUM) 40 MG packet Take 40 mg by mouth daily before breakfast. 90 each 3  . finasteride (PROSCAR) 5 MG tablet Take 1 tablet (5 mg total) by mouth daily. 90 tablet 3  . glimepiride (AMARYL) 2 MG tablet Take 1 tablet (2 mg total) by mouth daily with breakfast. 90 tablet 3  . glucose blood (PRODIGY NO CODING BLOOD GLUC) test strip 1 each by Other route daily. Use as instructed 100 each 1  . KRILL OIL PO Take 1 tablet by mouth daily.    . Loratadine 10 MG CAPS Take 10 mg by mouth 2 (two) times daily.    . nitroGLYCERIN (NITROSTAT) 0.4 MG SL tablet Place 1 tablet (0.4 mg total) under the tongue every 5 (five) minutes x 3 doses as needed for chest pain. 25 tablet 3  . pravastatin (PRAVACHOL) 20 MG tablet Take 1 tablet (20 mg total) by mouth every evening. 30 tablet 11  . PRODIGY LANCETS 26G MISC 1 each by Does not apply route daily. Dx code 250.00 100 each 1  . ticagrelor (BRILINTA) 90 MG TABS tablet Take 1 tablet (90 mg total) by mouth 2 (two) times daily. 180 tablet 3  . timolol (TIMOPTIC-XR) 0.5 % ophthalmic gel-forming  Place 1 drop into both eyes daily.     . [DISCONTINUED] bimatoprost (LUMIGAN) 0.03 % ophthalmic solution Place 1 drop into both eyes at bedtime.       No current facility-administered medications on file prior to visit.    Review of Systems  Constitutional: Negative for diaphoresis.  Respiratory: Negative for chest tightness and shortness of breath.   Cardiovascular: Negative for chest pain, palpitations and leg swelling.  Endocrine: Negative for polydipsia, polyphagia and polyuria.  Neurological: Negative for headaches.      Objective:    BP 134/62 mmHg  Pulse 69  Temp(Src) 97.7 F (36.5 C) (Oral)  Resp 18  Ht 5\' 8"  (1.727 m)  Wt 190 lb (86.183 kg)  BMI 28.90 kg/m2  SpO2 95% Nursing note and vital signs reviewed.  Physical Exam  Constitutional: He is oriented to  person, place, and time. He appears well-developed and well-nourished. No distress.  Cardiovascular: Normal rate, regular rhythm, normal heart sounds and intact distal pulses.   Pulmonary/Chest: Effort normal and breath sounds normal.  Neurological: He is alert and oriented to person, place, and time.  Skin: Skin is warm and dry.  Psychiatric: He has a normal mood and affect. His behavior is normal. Judgment and thought content normal.       Assessment & Plan:   Problem List Items Addressed This Visit      Cardiovascular and Mediastinum   CAD (coronary artery disease)    Stable and improved since leaving the hospital. Reports no chest pain and has returned to baseline function with activity. Continue to follow up with cardiology as scheduled.         Endocrine   Diabetes type 2, controlled - Primary    Type 2 diabetes appears well controlled with current regimen of glimepiride. Denies hypotensive events. Obtain A1c to determine current status. Continue current dosage of glimepiride pending A1c results. Consider addition of ACE inhibitor to protect kidneys and heart.       Relevant Orders   Hemoglobin A1c (Completed)    Other Visit Diagnoses    Encounter for immunization

## 2014-10-13 NOTE — Assessment & Plan Note (Addendum)
Type 2 diabetes appears well controlled with current regimen of glimepiride. Denies hypotensive events. Obtain A1c to determine current status. Continue current dosage of glimepiride pending A1c results. Consider addition of ACE inhibitor to protect kidneys and heart.

## 2014-10-13 NOTE — Patient Instructions (Signed)
Thank you for choosing [facility].  Summary/Instructions:  Please continue your medications as prescribed.   Your prescription(s) have been submitted to your pharmacy or been printed and provided for you. Please take as directed and contact our office if you believe you are having problem(s) with the medication(s) or have any questions.  If your symptoms worsen or fail to improve, please contact our office for further instruction, or in case of emergency go directly to the emergency room at the closest medical facility.

## 2014-10-14 ENCOUNTER — Other Ambulatory Visit: Payer: Self-pay | Admitting: *Deleted

## 2014-10-14 NOTE — Telephone Encounter (Signed)
Pharmacy address changed in the computer per pt request through My Chart today.

## 2014-10-17 ENCOUNTER — Encounter: Payer: Self-pay | Admitting: Family

## 2014-10-21 ENCOUNTER — Other Ambulatory Visit: Payer: Self-pay | Admitting: Physician Assistant

## 2014-10-31 ENCOUNTER — Encounter: Payer: Self-pay | Admitting: Family

## 2014-11-08 ENCOUNTER — Other Ambulatory Visit: Payer: Self-pay | Admitting: Family

## 2014-11-08 MED ORDER — LISINOPRIL 2.5 MG PO TABS: 2.5000 mg | ORAL_TABLET | Freq: Every day | ORAL | Status: DC

## 2014-11-18 ENCOUNTER — Other Ambulatory Visit (INDEPENDENT_AMBULATORY_CARE_PROVIDER_SITE_OTHER): Payer: PPO | Admitting: *Deleted

## 2014-11-18 DIAGNOSIS — I2510 Atherosclerotic heart disease of native coronary artery without angina pectoris: Secondary | ICD-10-CM

## 2014-11-18 LAB — HEPATIC FUNCTION PANEL
ALK PHOS: 51 U/L (ref 39–117)
ALT: 16 U/L (ref 0–53)
AST: 16 U/L (ref 0–37)
Albumin: 4.1 g/dL (ref 3.5–5.2)
BILIRUBIN DIRECT: 0.3 mg/dL (ref 0.0–0.3)
BILIRUBIN TOTAL: 1.3 mg/dL — AB (ref 0.2–1.2)
TOTAL PROTEIN: 6.7 g/dL (ref 6.0–8.3)

## 2014-11-18 LAB — LIPID PANEL
CHOL/HDL RATIO: 4
Cholesterol: 107 mg/dL (ref 0–200)
HDL: 25.9 mg/dL — ABNORMAL LOW (ref >39.00)
LDL Cholesterol: 61 mg/dL (ref 0–99)
NonHDL: 81.09
TRIGLYCERIDES: 98 mg/dL (ref 0.0–149.0)
VLDL: 19.6 mg/dL (ref 0.0–40.0)

## 2014-11-21 ENCOUNTER — Telehealth: Payer: Self-pay | Admitting: *Deleted

## 2014-11-21 NOTE — Telephone Encounter (Signed)
F/u    Returning your call.

## 2014-11-21 NOTE — Telephone Encounter (Signed)
DPR on file for pt's son Timothy Snyder. Lmptcb for lab results.

## 2014-11-21 NOTE — Telephone Encounter (Signed)
S/w DPR pt's son Dominica Severin, who has been notified of lab reuslts by phone with verbal understanding.

## 2014-11-22 ENCOUNTER — Ambulatory Visit: Payer: PPO | Admitting: Cardiology

## 2014-11-25 ENCOUNTER — Encounter: Payer: Self-pay | Admitting: Cardiology

## 2014-11-30 ENCOUNTER — Encounter: Payer: Self-pay | Admitting: Cardiology

## 2014-11-30 ENCOUNTER — Ambulatory Visit (INDEPENDENT_AMBULATORY_CARE_PROVIDER_SITE_OTHER): Payer: PPO | Admitting: Cardiology

## 2014-11-30 VITALS — BP 118/58 | HR 68 | Ht 68.0 in | Wt 191.2 lb

## 2014-11-30 DIAGNOSIS — E1122 Type 2 diabetes mellitus with diabetic chronic kidney disease: Secondary | ICD-10-CM

## 2014-11-30 DIAGNOSIS — N182 Chronic kidney disease, stage 2 (mild): Secondary | ICD-10-CM | POA: Diagnosis not present

## 2014-11-30 DIAGNOSIS — I2510 Atherosclerotic heart disease of native coronary artery without angina pectoris: Secondary | ICD-10-CM

## 2014-11-30 NOTE — Patient Instructions (Signed)
Continue your current therapy  I will see you in about 6 months   

## 2014-11-30 NOTE — Progress Notes (Signed)
Cardiology Office Note   Date:  11/30/2014   ID:  Timothy Snyder, DOB 1927-09-15, MRN [id number]  PCP:  Mauricio Po, FNP  Cardiologist:  Dr. Rahm Minix Snyder     Chief Complaint  Patient presents with  . Coronary Artery Disease     History of Present Illness: Timothy Snyder is a 81 y.o. male with a hx of diabetes, HTN, HL, CKD stage III, macular degeneration (blind).  Admitted 8/2-8/4 with a non-STEMI. LHC demonstrated single-vessel disease with 99% mid LAD lesion treated with a Synergy DES. Echocardiogram demonstrated normal LV function.  He returns for follow-up.  Here with his son.  He denies chest pain, syncope, dizziness, shortness of breath, orthopnea, edema. He is blind. Was started on pravastatin last visit and is tolerating this well. He is riding an exercise bike 30 minutes a day.   Studies/Reports Reviewed Today:  Echo 09/22/14 EF 55-60%, mild HK of the apical myocardium, grade 2 diastolic dysfunction, moderate to severe LAE.  LHC 09/22/14 LAD: Proximal to mid 50%, mid 99% LCx: 10% RCA: 10% PCI: 3 x 32 mm Synergy DES to the mid LAD 1. Single vessel obstructive CAD 2. Successful PCI of the proximal to mid LAD with a DES. Recommendation: DAPT for one year. Check Echo for LV function. Possible DC tomorrow if no complications.   Past Medical History  Diagnosis Date  . Actinic keratosis   . ARTHRITIS   . Macular degeneration     L>R vision loss, legally blind  . Restless leg syndrome   . BPH (benign prostatic hyperplasia)   . HYPERTENSION   . GLUCOMA   . GERD (gastroesophageal reflux disease)   . DIABETES MELLITUS, TYPE II   . CAD (coronary artery disease)     cath 09/21/2014 1v dx, 99% mid LAD treated with DES, 50% prox to mid LAD.    Past Surgical History  Procedure Laterality Date  . No past surgeries    . Cardiac catheterization N/A 09/21/2014    Procedure: Left Heart Cath and Coronary Angiography;  Surgeon: Ward Boissonneault M Martinique, MD;  Location: [facility];  Service: Cardiovascular;  Laterality: N/A;  . Cardiac catheterization  09/21/2014    Procedure: Coronary Stent Intervention;  Surgeon: Kellie Murrill M Martinique, MD;  Location: [facility];  Service: Cardiovascular;;     Current Outpatient Prescriptions  Medication Sig Dispense Refill  . aspirin 81 MG tablet Take 81 mg by mouth daily.    . beta carotene w/minerals (OCUVITE) tablet Take 1 tablet by mouth at bedtime.     . carvedilol (COREG) 3.125 MG tablet Take 1 tablet (3.125 mg total) by mouth 2 (two) times daily with a meal. 60 tablet 5  . clonazePAM (KLONOPIN) 0.5 MG tablet Take 1 tablet (0.5 mg total) by mouth at bedtime. 90 tablet 1  . diphenhydramine-acetaminophen (TYLENOL PM) 25-500 MG TABS Take 1 tablet by mouth at bedtime.     Marland Kitchen doxazosin (CARDURA) 4 MG tablet Take 1 tablet (4 mg total) by mouth daily. (Patient taking differently: Take 2 mg by mouth daily. ) 90 tablet 3  . esomeprazole (NEXIUM) 40 MG packet Take 40 mg by mouth daily before breakfast. 90 each 3  . finasteride (PROSCAR) 5 MG tablet Take 1 tablet (5 mg total) by mouth daily. 90 tablet 3  . glimepiride (AMARYL) 2 MG tablet Take 1 tablet (2 mg total) by mouth daily with breakfast. 90 tablet 3  . glucose blood (PRODIGY NO CODING BLOOD  GLUC) test strip 1 each by Other route daily. Use as instructed 100 each 1  . KRILL OIL PO Take 1 tablet by mouth daily.    Marland Kitchen lisinopril (ZESTRIL) 2.5 MG tablet Take 1 tablet (2.5 mg total) by mouth daily. 30 tablet 0  . Loratadine 10 MG CAPS Take 10 mg by mouth 2 (two) times daily.    . nitroGLYCERIN (NITROSTAT) 0.4 MG SL tablet Place 1 tablet (0.4 mg total) under the tongue every 5 (five) minutes x 3 doses as needed for chest pain. 25 tablet 3  . pravastatin (PRAVACHOL) 20 MG tablet TAKE 1 TABLET BY MOUTH EVERY EVENING 30 tablet 3  . PRODIGY LANCETS 26G MISC 1 each by Does not apply route daily. Dx code 250.00 100 each 1  . ticagrelor (BRILINTA) 90 MG TABS tablet Take 1 tablet  (90 mg total) by mouth 2 (two) times daily. 180 tablet 3  . timolol (TIMOPTIC-XR) 0.5 % ophthalmic gel-forming Place 1 drop into both eyes daily.     . [DISCONTINUED] bimatoprost (LUMIGAN) 0.03 % ophthalmic solution Place 1 drop into both eyes at bedtime.       No current facility-administered medications for this visit.    Allergies:   Brinzolamide-brimonidine and Other    Social History:  The patient  reports that he has never smoked. He does not have any smokeless tobacco history on file. He reports that he does not drink alcohol or use illicit drugs.   Family History:  The patient's family history includes Cancer in his father; Coronary artery disease (age of onset: 38) in his mother; Heart failure in his mother.    ROS:   Please see the history of present illness.   Review of Systems  All other systems reviewed and are negative.     PHYSICAL EXAM: VS:  BP 118/58 mmHg  Pulse 68  Ht 5\' 8"  (1.727 m)  Wt 86.728 kg (191 lb 3.2 oz)  BMI 29.08 kg/m2  SpO2 97%    Wt Readings from Last 3 Encounters:  11/30/14 86.728 kg (191 lb 3.2 oz)  10/13/14 86.183 kg (190 lb)  09/29/14 82.192 kg (181 lb 3.2 oz)     GEN: Well nourished, well developed, in no acute distress HEENT: normal Neck: no JVD,   no masses Cardiac:  Normal S1/S2, RRR; no murmur ,  no rubs or gallops, no edema;  right wrist without hematoma or mass  Respiratory:  clear to auscultation bilaterally, no wheezing, rhonchi or rales. GI: soft, nontender, nondistended, + BS MS: no deformity or atrophy Skin: warm and dry  Neuro:  CNs II-XII intact, Strength and sensation are intact Psych: Normal affect   EKG:  EKG is not ordered today.  Lab Results  Component Value Date   WBC 8.5 09/22/2014   HGB 14.4 09/22/2014   HCT 41.4 09/22/2014   PLT 122* 09/22/2014   GLUCOSE 190* 09/29/2014   CHOL 107 11/18/2014   TRIG 98.0 11/18/2014   HDL 25.90* 11/18/2014   LDLDIRECT 93.1 09/29/2012   LDLCALC 61 11/18/2014   ALT 16  11/18/2014   AST 16 11/18/2014   NA 140 09/29/2014   K 3.8 09/29/2014   CL 107 09/29/2014   CREATININE 1.26 09/29/2014   BUN 18 09/29/2014   CO2 25 09/29/2014   TSH 2.36 10/01/2011   PSA 1.87 03/31/2012   INR 1.20 09/21/2014   HGBA1C 6.0 10/13/2014   MICROALBUR <0.7 04/13/2014      ASSESSMENT AND PLAN:  1. CAD:  Status post recent non-STEMI treated with Synergy DES to the LAD. LV function normal by echocardiogram post PCI. He is doing well. No further angina.  Continue aspirin, Brilinta, beta blocker. Continue statin. Plan to continue DAPT for one year from stent.   2. Hypertension:  Controlled.   3. Hyperlipidemia:  Excellent control.  4. Diabetes: Follow-up with primary care. A1C 6%.   5. Chronic Kidney Disease: stage 2.      Medication Changes: Current medicines are reviewed at length with the patient today.  Concerns regarding medicines are as outlined above.  The following changes have been made:   Discontinued Medications   No medications on file   Modified Medications   No medications on file   New Prescriptions   No medications on file    Labs/ tests ordered today include:   No orders of the defined types were placed in this encounter.     Disposition:   FU with Dr. Lenn Volker Snyder 6-8 months    Signed, Timothy Grace Martinique MD, [facility] 11/30/2014 2:00 PM

## 2014-12-13 ENCOUNTER — Encounter: Payer: Self-pay | Admitting: Family

## 2014-12-20 ENCOUNTER — Encounter: Payer: PPO | Admitting: Family

## 2014-12-23 ENCOUNTER — Encounter: Payer: Self-pay | Admitting: Family

## 2014-12-23 ENCOUNTER — Ambulatory Visit (INDEPENDENT_AMBULATORY_CARE_PROVIDER_SITE_OTHER): Payer: PPO | Admitting: Family

## 2014-12-23 VITALS — BP 124/62 | HR 64 | Temp 97.5°F | Resp 18 | Ht 68.0 in | Wt 193.0 lb

## 2014-12-23 DIAGNOSIS — Z0000 Encounter for general adult medical examination without abnormal findings: Secondary | ICD-10-CM

## 2014-12-23 NOTE — Progress Notes (Signed)
Pre visit review using our clinic review tool, if applicable. No additional management support is needed unless otherwise documented below in the visit note. 

## 2014-12-23 NOTE — Progress Notes (Signed)
Subjective:    Patient ID: Timothy Snyder, male    DOB: 07-Nov-1927, 81 y.o.   MRN: [id number]  Chief Complaint  Patient presents with  . CPE    not fasting    HPI:  Timothy Snyder is a 81 y.o. male who presents today for an annual wellness visit.   1) Health Maintenance -   Diet - Regular diet; Averages about 3-5 meals per day consisting chicken, fish, vegetables and fruit. Caffeine intake of about 1-2 cups per day.   Exercise - 30 mins of stationary cycling most days of the week.   2) Preventative Exams / Immunizations:  Dental -- Dentures  Vision -- Up to date   Health Maintenance  Topic Date Due  . FOOT EXAM  10/14/2014  . ZOSTAVAX  04/02/2015 (Originally 07/13/1989)  . URINE MICROALBUMIN  04/14/2015  . HEMOGLOBIN A1C  04/15/2015  . INFLUENZA VACCINE  09/19/2015  . OPHTHALMOLOGY EXAM  09/19/2015  . TETANUS/TDAP  04/01/2021  . PNA vac Low Risk Adult  Completed  Declines Zostavax   Immunization History  Administered Date(s) Administered  . Influenza Split 11/27/2011  . Influenza Whole 10/16/2009  . Influenza, High Dose Seasonal PF 10/13/2014  . Influenza,inj,Quad PF,36+ Mos 10/13/2013  . Influenza-Unspecified 11/02/2012  . Pneumococcal Conjugate-13 04/13/2014  . Pneumococcal Polysaccharide-23 04/02/2011  . Tetanus 04/02/2011     RISK FACTORS  Tobacco History  Smoking status  . Never Smoker   Smokeless tobacco  . Never Used     Cardiac risk factors: advanced age (older than 63 for men, 1 for women), diabetes mellitus, dyslipidemia and hypertension.  Depression Screen  Q1: Over the past two weeks, have you felt down, depressed or hopeless? No  Q2: Over the past two weeks, have you felt little interest or pleasure in doing things? No  Have you lost interest or pleasure in daily life? No  Do you often feel hopeless? No  Do you cry easily over simple problems? No  Activities of Daily Living In your present state of health, do you have any  difficulty performing the following activities?:  Driving? Legally blind Managing money?  No Feeding yourself? No Getting from bed to chair? No Climbing a flight of stairs? No Preparing food and eating?: No Bathing or showering? No Getting dressed: No Getting to the toilet? No Using the toilet: No Moving around from place to place: No In the past year have you fallen or had a near fall?:No   Home Safety Has smoke detector and wears seat belts. No firearms. No excess sun exposure. Are there smokers in your home (other than you)?  No Do you feel safe at home?  Yes  Hearing Difficulties: No Do you often ask people to speak up or repeat themselves? No Do you experience ringing or noises in your ears? No  Do you have difficulty understanding soft or whispered voices? No    Cognitive Testing  Alert? Yes   Normal Appearance? Yes  Oriented to person? Yes  Place? Yes   Time? Yes  Recall of three objects?  Yes  Can perform simple calculations? Yes  Displays appropriate judgment? Yes  Can read the correct time from a watch face? Yes  Do you feel that you have a problem with memory? No  Do you often misplace items? No   Advanced Directives have been discussed with the patient? Yes  Current Physicians/Providers and Suppliers  1. Terri Piedra, FNP - Primary Care  2. Peter Martinique,  MD - Cardiology   Indicate any recent Medical Services you may have received from other than [facility] providers in the past year (date may be approximate).  All answers were reviewed with the patient and necessary referrals were made:  Timothy Snyder, [place]   12/23/2014    Review of Systems  Constitutional: Denies fever, chills, fatigue, or significant weight gain/loss. HENT: Head: Denies headache or neck pain Ears: Denies changes in hearing, ringing in ears, earache, drainage Nose: Denies discharge, stuffiness, itching, nosebleed, sinus pain Throat: Denies sore throat, hoarseness, dry mouth, sores,  thrush Eyes: Positive for blindness Cardiovascular: Denies chest pain/discomfort, tightness, palpitations, shortness of breath with activity, difficulty lying down, swelling, sudden awakening with shortness of breath Respiratory: Denies shortness of breath, cough, sputum production, wheezing Gastrointestinal: Denies dysphasia, heartburn, change in appetite, nausea, change in bowel habits, rectal bleeding, constipation, diarrhea, yellow skin or eyes Genitourinary: Denies frequency, urgency, burning/pain, blood in urine, incontinence, change in urinary strength. Musculoskeletal: Denies muscle/joint pain, stiffness, back pain, redness or swelling of joints, trauma Skin: Denies rashes, lumps, itching, dryness, color changes, or hair/nail changes Neurological: Denies dizziness, fainting, seizures, weakness, numbness, tingling, tremor Psychiatric - Denies nervousness, stress, depression or memory loss Endocrine: Denies heat or cold intolerance, sweating, frequent urination, excessive thirst, changes in appetite Hematologic: Denies ease of bruising or bleeding    Objective:     BP 124/62 mmHg  Pulse 64  Temp(Src) 97.5 F (36.4 C) (Oral)  Resp 18  Ht 5\' 8"  (1.727 m)  Wt 193 lb (87.544 kg)  BMI 29.35 kg/m2  SpO2 95% Nursing note and vital signs reviewed.  Physical Exam  Constitutional: He is oriented to person, place, and time. He appears well-developed and well-nourished. No distress.  Cardiovascular: Normal rate, regular rhythm, normal heart sounds and intact distal pulses.   Pulmonary/Chest: Effort normal and breath sounds normal.  Neurological: He is alert and oriented to person, place, and time.  Skin: Skin is warm and dry.  Psychiatric: He has a normal mood and affect. His behavior is normal. Judgment and thought content normal.       Assessment & Plan:   During the course of the visit the patient was educated and counseled about appropriate screening and preventive services  including:    Pneumococcal vaccine   Hepatitis B vaccine  Prostate cancer screening  Diabetes screening  Nutrition counseling   Diet review for nutrition referral? Yes ____  Not Indicated _X___   Patient Instructions (the written plan) was given to the patient.  Medicare Attestation I have personally reviewed: The patient's medical and social history Their use of alcohol, tobacco or illicit drugs Their current medications and supplements The patient's functional ability including ADLs,fall risks, home safety risks, cognitive, and hearing and visual impairment Diet and physical activities Evidence for depression or mood disorders  The patient's weight, height, BMI,  have been recorded in the chart.  I have made referrals, counseling, and provided education to the patient based on review of the above and I have provided the patient with a written personalized care plan for preventive services.     Timothy Po, FNP   12/23/2014    Problem List Items Addressed This Visit      Other   Medicare annual wellness visit, subsequent - Primary    Reviewed and updated patient's medical, surgical, family and social history. Medications and allergies were also reviewed. Basic screenings for depression, activities of daily living, hearing, cognition and safety were performed. Provider list was  updated and health plan was provided to the patient.   1) Anticipatory Guidance: Discussed importance of wearing a seatbelt while driving and not texting while driving; changing batteries in smoke detector at least once annually; wearing suntan lotion when outside; eating a balanced and moderate diet; getting physical activity at least 30 minutes per day.  2) Immunizations / Screenings / Labs:  Declines Zostavax. All other immunizations are up to date per recommendations. Due for diabetic screenings to be completed at next office visit. Depression screen completed and is negative. Fall screen  completed and negative. All other screenings are up to date per recommendations.

## 2014-12-23 NOTE — Assessment & Plan Note (Signed)
Reviewed and updated patient's medical, surgical, family and social history. Medications and allergies were also reviewed. Basic screenings for depression, activities of daily living, hearing, cognition and safety were performed. Provider list was updated and health plan was provided to the patient.   1) Anticipatory Guidance: Discussed importance of wearing a seatbelt while driving and not texting while driving; changing batteries in smoke detector at least once annually; wearing suntan lotion when outside; eating a balanced and moderate diet; getting physical activity at least 30 minutes per day.  2) Immunizations / Screenings / Labs:  Declines Zostavax. All other immunizations are up to date per recommendations. Due for diabetic screenings to be completed at next office visit. Depression screen completed and is negative. Fall screen completed and negative. All other screenings are up to date per recommendations.

## 2014-12-23 NOTE — Patient Instructions (Signed)
Thank you for choosing [facility].  Summary/Instructions:  Gas-X as needed.   Continue to take your medications as prescribed.   Health Maintenance, Male A healthy lifestyle and preventative care can promote health and wellness.  Maintain regular health, dental, and eye exams.  Eat a healthy diet. Foods like vegetables, fruits, whole grains, low-fat dairy products, and lean protein foods contain the nutrients you need and are low in calories. Decrease your intake of foods high in solid fats, added sugars, and salt. Get information about a proper diet from your health care provider, if necessary.  Regular physical exercise is one of the most important things you can do for your health. Most adults should get at least 150 minutes of moderate-intensity exercise (any activity that increases your heart rate and causes you to sweat) each week. In addition, most adults need muscle-strengthening exercises on 2 or more days a week.   Maintain a healthy weight. The body mass index (BMI) is a screening tool to identify possible weight problems. It provides an estimate of body fat based on height and weight. Your health care provider can find your BMI and can help you achieve or maintain a healthy weight. For males 20 years and older:  A BMI below 18.5 is considered underweight.  A BMI of 18.5 to 24.9 is normal.  A BMI of 25 to 29.9 is considered overweight.  A BMI of 30 and above is considered obese.  Maintain normal blood lipids and cholesterol by exercising and minimizing your intake of saturated fat. Eat a balanced diet with plenty of fruits and vegetables. Blood tests for lipids and cholesterol should begin at age 38 and be repeated every 5 years. If your lipid or cholesterol levels are high, you are over age 72, or you are at high risk for heart disease, you may need your cholesterol levels checked more frequently.Ongoing high lipid and cholesterol levels should be treated with  medicines if diet and exercise are not working.  If you smoke, find out from your health care provider how to quit. If you do not use tobacco, do not start.  Lung cancer screening is recommended for adults aged 65-80 years who are at high risk for developing lung cancer because of a history of smoking. A yearly low-dose CT scan of the lungs is recommended for people who have at least a 30-pack-year history of smoking and are current smokers or have quit within the past 15 years. A pack year of smoking is smoking an average of 1 pack of cigarettes a day for 1 year (for example, a 30-pack-year history of smoking could mean smoking 1 pack a day for 30 years or 2 packs a day for 15 years). Yearly screening should continue until the smoker has stopped smoking for at least 15 years. Yearly screening should be stopped for people who develop a health problem that would prevent them from having lung cancer treatment.  If you choose to drink alcohol, do not have more than 2 drinks per day. One drink is considered to be 12 oz (360 mL) of beer, 5 oz (150 mL) of wine, or 1.5 oz (45 mL) of liquor.  Avoid the use of street drugs. Do not share needles with anyone. Ask for help if you need support or instructions about stopping the use of drugs.  High blood pressure causes heart disease and increases the risk of stroke. High blood pressure is more likely to develop in:  People who have blood pressure in  the end of the normal range (100-139/85-89 mm Hg).  People who are overweight or obese.  People who are African American.  If you are 59-42 years of age, have your blood pressure checked every 3-5 years. If you are 34 years of age or older, have your blood pressure checked every year. You should have your blood pressure measured twice--once when you are at a hospital or clinic, and once when you are not at a hospital or clinic. Record the average of the two measurements. To check your blood pressure when you are not  at a hospital or clinic, you can use:  An automated blood pressure machine at a pharmacy.  A home blood pressure monitor.  If you are 82-19 years old, ask your health care provider if you should take aspirin to prevent heart disease.  Diabetes screening involves taking a blood sample to check your fasting blood sugar level. This should be done once every 3 years after age 23 if you are at a normal weight and without risk factors for diabetes. Testing should be considered at a younger age or be carried out more frequently if you are overweight and have at least 1 risk factor for diabetes.  Colorectal cancer can be detected and often prevented. Most routine colorectal cancer screening begins at the age of 27 and continues through age 62. However, your health care provider may recommend screening at an earlier age if you have risk factors for colon cancer. On a yearly basis, your health care provider may provide home test kits to check for hidden blood in the stool. A small camera at the end of a tube may be used to directly examine the colon (sigmoidoscopy or colonoscopy) to detect the earliest forms of colorectal cancer. Talk to your health care provider about this at age 61 when routine screening begins. A direct exam of the colon should be repeated every 5-10 years through age 89, unless early forms of precancerous polyps or small growths are found.  People who are at an increased risk for hepatitis B should be screened for this virus. You are considered at high risk for hepatitis B if:  You were born in a country where hepatitis B occurs often. Talk with your health care provider about which countries are considered high risk.  Your parents were born in a high-risk country and you have not received a shot to protect against hepatitis B (hepatitis B vaccine).  You have HIV or AIDS.  You use needles to inject street drugs.  You live with, or have sex with, someone who has hepatitis B.  You  are a man who has sex with other men (MSM).  You get hemodialysis treatment.  You take certain medicines for conditions like cancer, organ transplantation, and autoimmune conditions.  Hepatitis C blood testing is recommended for all people born from [birth year] through 1963 and any individual with known risk factors for hepatitis C.  Healthy men should no longer receive prostate-specific antigen (PSA) blood tests as part of routine cancer screening. Talk to your health care provider about prostate cancer screening.  Testicular cancer screening is not recommended for adolescents or adult males who have no symptoms. Screening includes self-exam, a health care provider exam, and other screening tests. Consult with your health care provider about any symptoms you have or any concerns you have about testicular cancer.  Practice safe sex. Use condoms and avoid high-risk sexual practices to reduce the spread of sexually transmitted infections (STIs).  You  should be screened for STIs, including gonorrhea and chlamydia if:  You are sexually active and are younger than 24 years.  You are older than 24 years, and your health care provider tells you that you are at risk for this type of infection.  Your sexual activity has changed since you were last screened, and you are at an increased risk for chlamydia or gonorrhea. Ask your health care provider if you are at risk.  If you are at risk of being infected with HIV, it is recommended that you take a prescription medicine daily to prevent HIV infection. This is called pre-exposure prophylaxis (PrEP). You are considered at risk if:  You are a man who has sex with other men (MSM).  You are a heterosexual man who is sexually active with multiple partners.  You take drugs by injection.  You are sexually active with a partner who has HIV.  Talk with your health care provider about whether you are at high risk of being infected with HIV. If you choose to begin  PrEP, you should first be tested for HIV. You should then be tested every 3 months for as long as you are taking PrEP.  Use sunscreen. Apply sunscreen liberally and repeatedly throughout the day. You should seek shade when your shadow is shorter than you. Protect yourself by wearing long sleeves, pants, a wide-brimmed hat, and sunglasses year round whenever you are outdoors.  Tell your health care provider of new moles or changes in moles, especially if there is a change in shape or color. Also, tell your health care provider if a mole is larger than the size of a pencil eraser.  A one-time screening for abdominal aortic aneurysm (AAA) and surgical repair of large AAAs by ultrasound is recommended for men aged 37-75 years who are current or former smokers.  Stay current with your vaccines (immunizations).   This information is not intended to replace advice given to you by your health care provider. Make sure you discuss any questions you have with your health care provider.   Document Released: 08/03/2007 Document Revised: 02/25/2014 Document Reviewed: 07/02/2010 Elsevier Interactive Patient Education 2016 [employer] Maintenance  Topic Date Due  . OPHTHALMOLOGY EXAM  05/20/2014  . FOOT EXAM  10/14/2014  . ZOSTAVAX  04/02/2015 (Originally 07/13/1989)  . URINE MICROALBUMIN  04/14/2015  . HEMOGLOBIN A1C  04/15/2015  . INFLUENZA VACCINE  09/19/2015  . TETANUS/TDAP  04/01/2021  . PNA vac Low Risk Adult  Completed

## 2014-12-29 ENCOUNTER — Encounter: Payer: Self-pay | Admitting: Family

## 2014-12-30 ENCOUNTER — Other Ambulatory Visit: Payer: Self-pay

## 2014-12-30 MED ORDER — CLONAZEPAM 0.5 MG PO TABS: 0.5000 mg | ORAL_TABLET | Freq: Every day | ORAL | Status: DC

## 2014-12-30 MED ORDER — LISINOPRIL 2.5 MG PO TABS: 2.5000 mg | ORAL_TABLET | Freq: Every day | ORAL | Status: DC

## 2014-12-30 MED ORDER — GLIMEPIRIDE 2 MG PO TABS: 2.0000 mg | ORAL_TABLET | Freq: Every day | ORAL | Status: DC

## 2015-02-17 IMAGING — CR DG CHEST 2V
2 series · 2 of 2 positions shown · non-contrast
Comparison: None.

CLINICAL DATA: Two weeks of nonproductive cough and wheezing;
previous tobacco use ; history of diabetes and hypertension

EXAM:
CHEST  2 VIEW

[w chest pa]
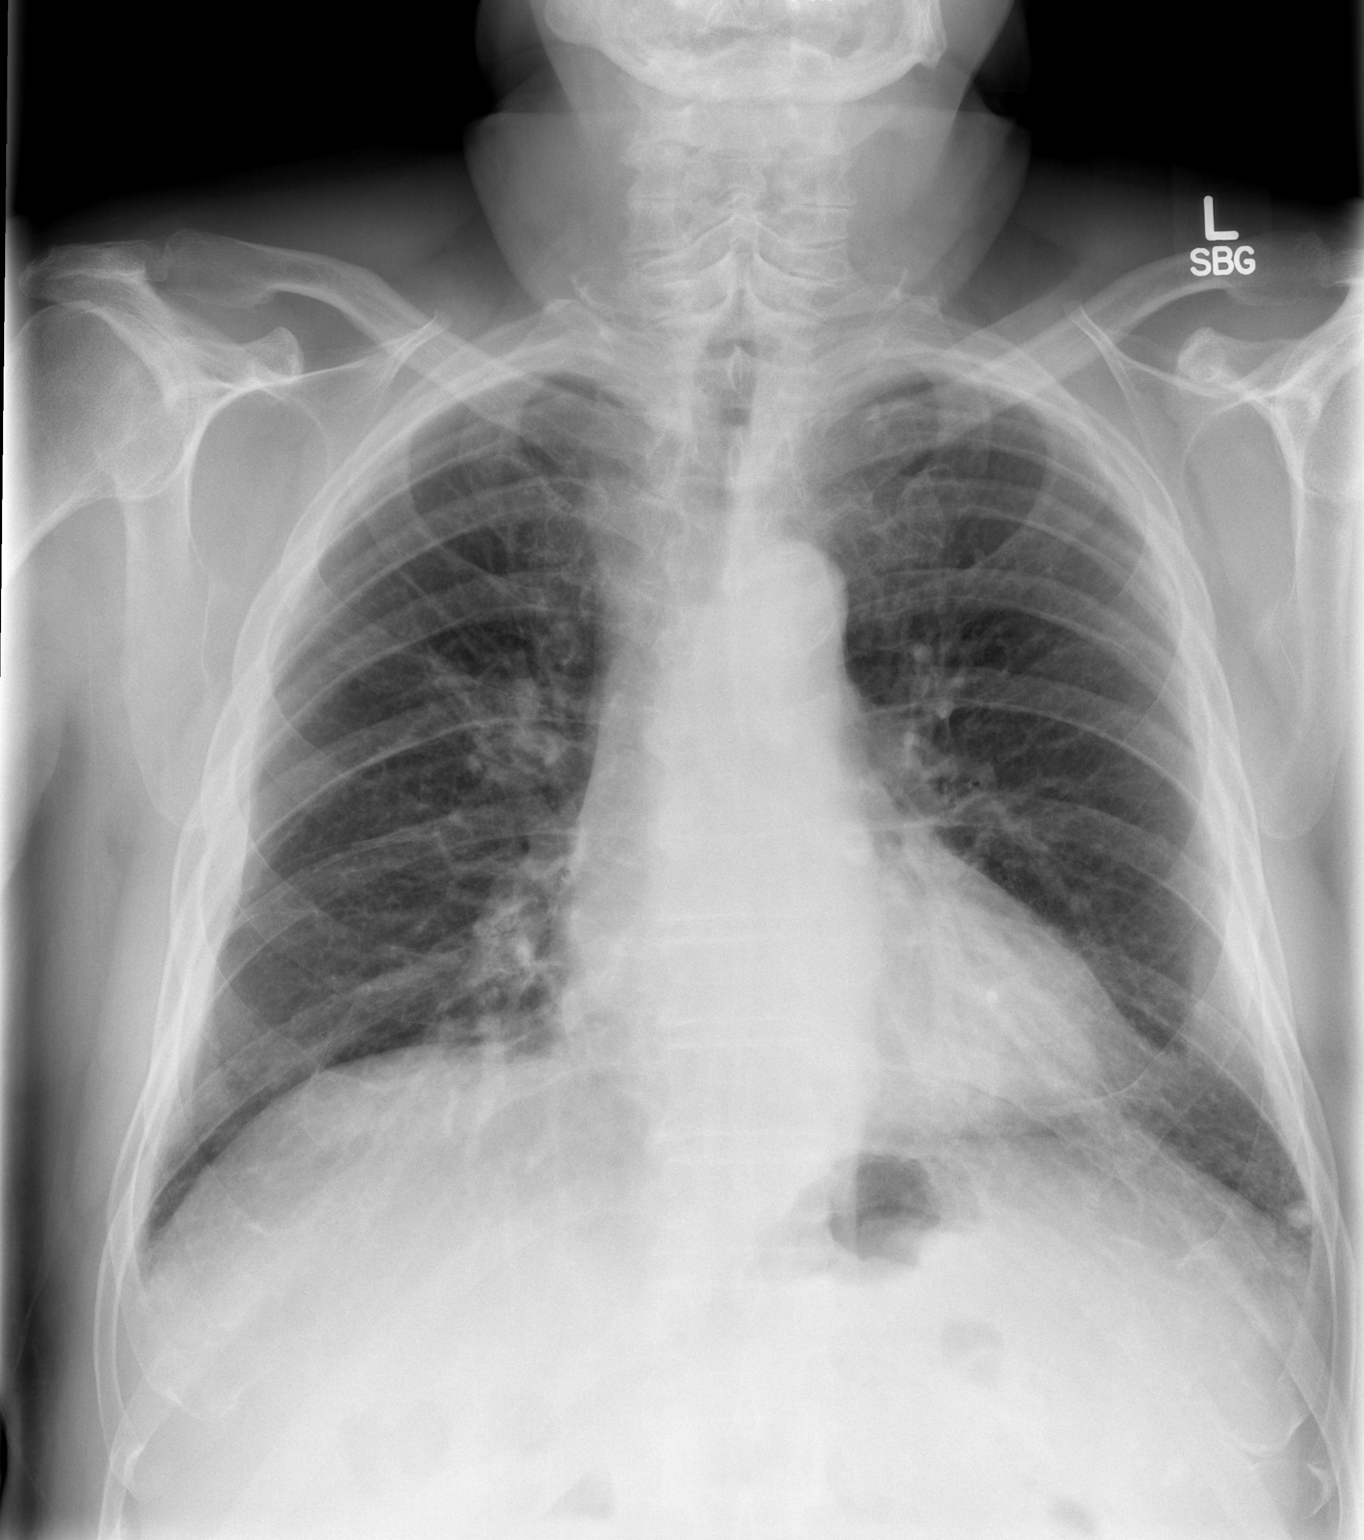

[w chest lat]
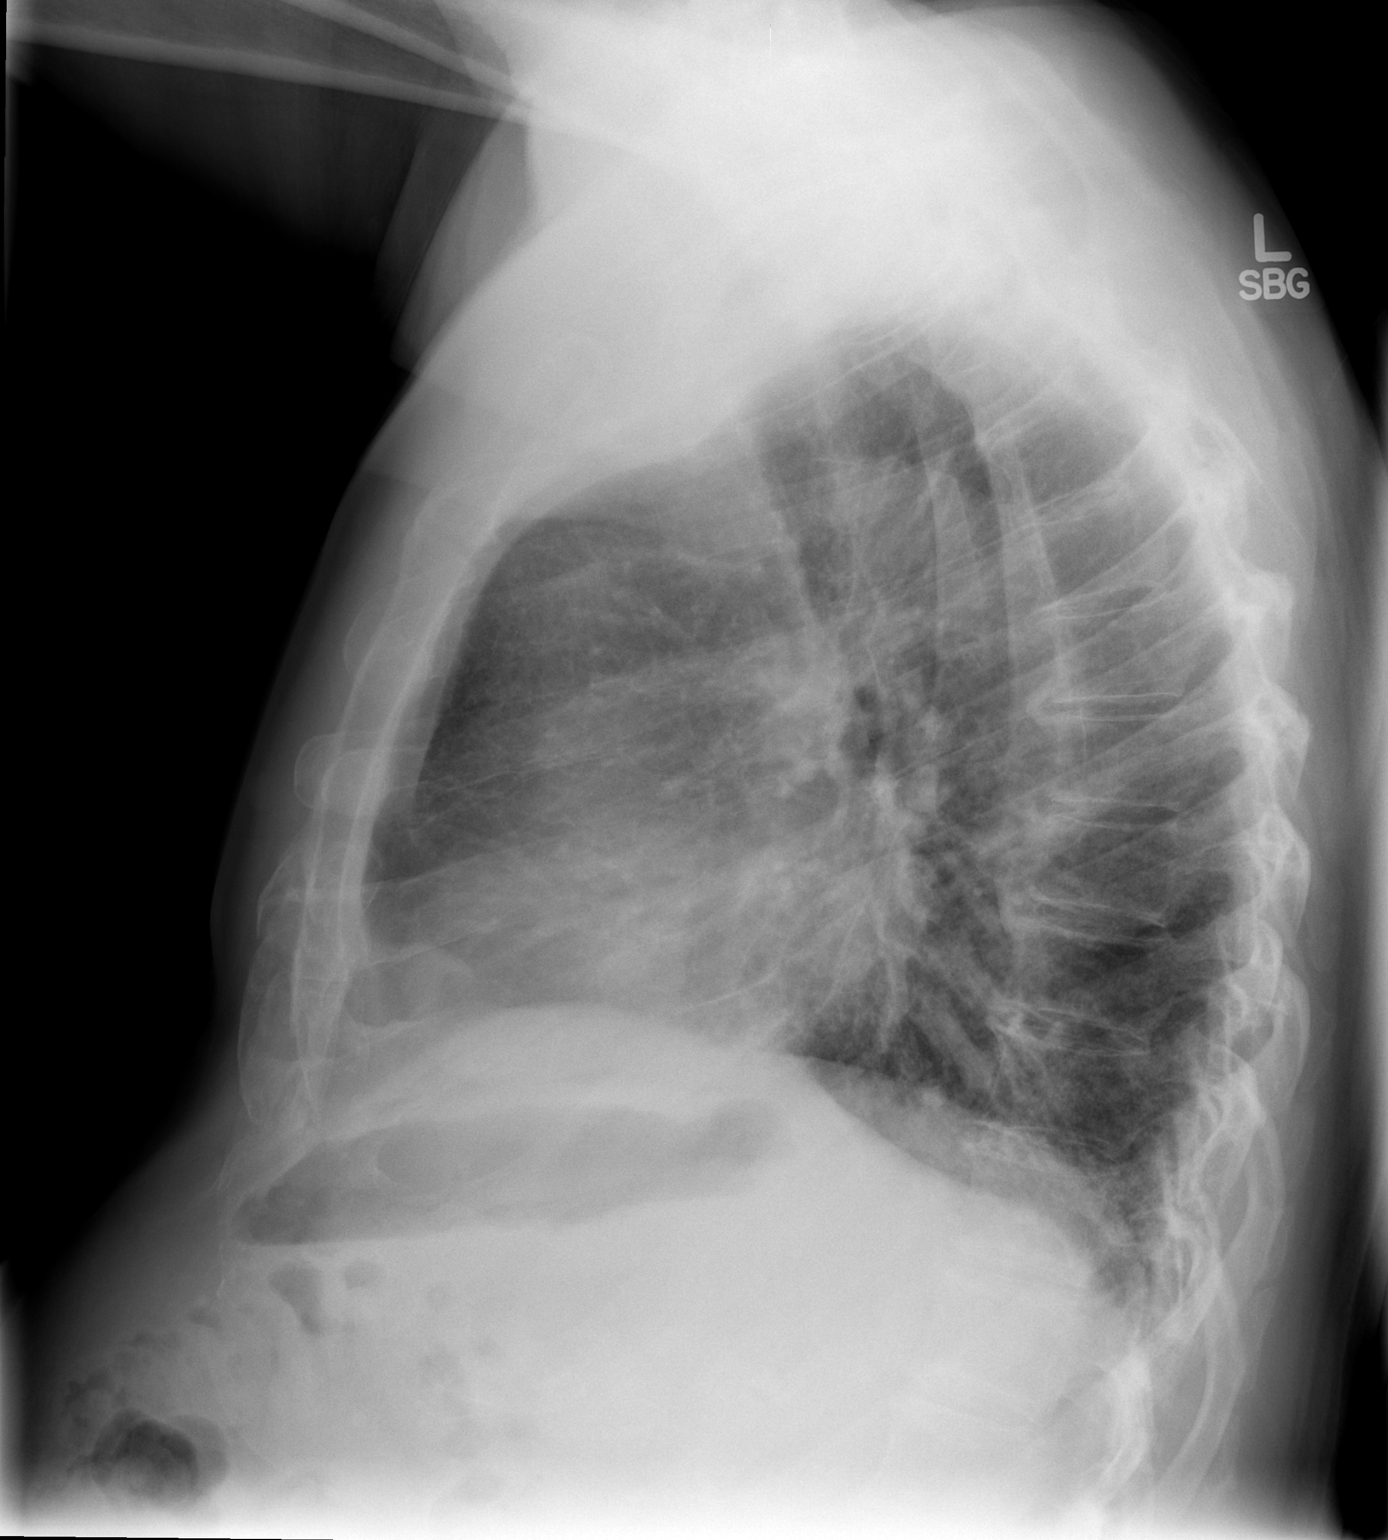

[2 of 2 positions shown; findings below may reference images not displayed]

FINDINGS: The lungs are adequately inflated. The interstitial markings are
increased bilaterally. There is a calcified nodule measuring
approximately 6 mm in diameter projecting over left lateral
costophrenic angle. There is pleural thickening along the lateral
thoracic walls bilaterally. There is no pleural effusion or
pneumothorax. The heart and pulmonary vascularity are within the
limits of normal. The bony thorax exhibits no acute abnormality.
IMPRESSION: Bilaterally increased interstitial markings may be related to the
patient's smoking history. Superimposed acute bronchitis or early
interstitial pneumonia is suspected. There is evidence of previous
granulomatous infection. Follow-up films following anticipated
antibiotic therapy are recommended to assure clearing. Chest CT
scanning may be ultimately needed to more fully evaluate the
pulmonary parenchyma.

## 2015-02-27 ENCOUNTER — Encounter: Payer: Self-pay | Admitting: Family

## 2015-03-20 ENCOUNTER — Encounter: Payer: Self-pay | Admitting: Internal Medicine

## 2015-03-30 ENCOUNTER — Other Ambulatory Visit: Payer: Self-pay | Admitting: Family

## 2015-03-30 ENCOUNTER — Other Ambulatory Visit: Payer: Self-pay | Admitting: Physician Assistant

## 2015-03-31 NOTE — Telephone Encounter (Signed)
Please review for refill, Thank you. 

## 2015-04-10 ENCOUNTER — Encounter: Payer: Self-pay | Admitting: Internal Medicine

## 2015-04-14 ENCOUNTER — Telehealth: Payer: Self-pay

## 2015-04-14 NOTE — Telephone Encounter (Signed)
Handicap Placard completed and placed on MD's desk for signature  MyChart message for pick up

## 2015-04-17 NOTE — Telephone Encounter (Signed)
This has been given to the patient per Dr Ronnald Ramp

## 2015-04-20 ENCOUNTER — Other Ambulatory Visit (INDEPENDENT_AMBULATORY_CARE_PROVIDER_SITE_OTHER): Payer: PPO

## 2015-04-20 ENCOUNTER — Encounter: Payer: Self-pay | Admitting: Internal Medicine

## 2015-04-20 ENCOUNTER — Ambulatory Visit: Payer: PPO | Admitting: Family

## 2015-04-20 ENCOUNTER — Ambulatory Visit (INDEPENDENT_AMBULATORY_CARE_PROVIDER_SITE_OTHER): Payer: PPO | Admitting: Internal Medicine

## 2015-04-20 VITALS — BP 130/62 | HR 66 | Temp 97.8°F | Resp 16 | Wt 198.0 lb

## 2015-04-20 DIAGNOSIS — R600 Localized edema: Secondary | ICD-10-CM

## 2015-04-20 DIAGNOSIS — I2510 Atherosclerotic heart disease of native coronary artery without angina pectoris: Secondary | ICD-10-CM | POA: Diagnosis not present

## 2015-04-20 DIAGNOSIS — E785 Hyperlipidemia, unspecified: Secondary | ICD-10-CM | POA: Diagnosis not present

## 2015-04-20 DIAGNOSIS — E118 Type 2 diabetes mellitus with unspecified complications: Secondary | ICD-10-CM

## 2015-04-20 DIAGNOSIS — I10 Essential (primary) hypertension: Secondary | ICD-10-CM | POA: Diagnosis not present

## 2015-04-20 LAB — URINALYSIS, ROUTINE W REFLEX MICROSCOPIC
BILIRUBIN URINE: NEGATIVE
HGB URINE DIPSTICK: NEGATIVE
KETONES UR: NEGATIVE
LEUKOCYTES UA: NEGATIVE
NITRITE: NEGATIVE
Specific Gravity, Urine: 1.025 (ref 1.000–1.030)
Total Protein, Urine: NEGATIVE
URINE GLUCOSE: NEGATIVE
UROBILINOGEN UA: 1 (ref 0.0–1.0)
WBC, UA: NONE SEEN (ref 0–?)
pH: 6 (ref 5.0–8.0)

## 2015-04-20 LAB — BASIC METABOLIC PANEL
BUN: 18 mg/dL (ref 6–23)
CO2: 27 mEq/L (ref 19–32)
Calcium: 9.1 mg/dL (ref 8.4–10.5)
Chloride: 107 mEq/L (ref 96–112)
Creatinine, Ser: 1.18 mg/dL (ref 0.40–1.50)
GFR: 62.24 mL/min (ref >60.00)
Glucose, Bld: 124 mg/dL — ABNORMAL HIGH (ref 70–99)
POTASSIUM: 3.9 meq/L (ref 3.5–5.1)
SODIUM: 141 meq/L (ref 135–145)

## 2015-04-20 LAB — LIPID PANEL
CHOLESTEROL: 123 mg/dL (ref 0–200)
HDL: 27.7 mg/dL — ABNORMAL LOW (ref >39.00)
LDL Cholesterol: 59 mg/dL (ref 0–99)
NONHDL: 95.25
Total CHOL/HDL Ratio: 4
Triglycerides: 181 mg/dL — ABNORMAL HIGH (ref 0.0–149.0)
VLDL: 36.2 mg/dL (ref 0.0–40.0)

## 2015-04-20 LAB — TROPONIN I: TNIDX: 0 ug/L (ref 0.00–0.06)

## 2015-04-20 LAB — MICROALBUMIN / CREATININE URINE RATIO
Creatinine,U: 182.1 mg/dL
Microalb Creat Ratio: 0.4 mg/g (ref 0.0–30.0)

## 2015-04-20 LAB — HEPATIC FUNCTION PANEL
ALT: 16 U/L (ref 0–53)
AST: 15 U/L (ref 0–37)
Albumin: 4 g/dL (ref 3.5–5.2)
Alkaline Phosphatase: 45 U/L (ref 39–117)
Bilirubin, Direct: 0.2 mg/dL (ref 0.0–0.3)
TOTAL PROTEIN: 6.4 g/dL (ref 6.0–8.3)
Total Bilirubin: 1 mg/dL (ref 0.2–1.2)

## 2015-04-20 LAB — BRAIN NATRIURETIC PEPTIDE: PRO B NATRI PEPTIDE: 89 pg/mL (ref 0.0–100.0)

## 2015-04-20 LAB — TSH: TSH: 1.04 u[IU]/mL (ref 0.35–4.50)

## 2015-04-20 LAB — HEMOGLOBIN A1C: Hgb A1c MFr Bld: 6.5 % (ref 4.6–6.5)

## 2015-04-20 NOTE — Patient Instructions (Signed)

## 2015-04-20 NOTE — Progress Notes (Signed)
Subjective:  Patient ID: Timothy Snyder, male    DOB: 1927/09/18  Age: 82 y.o. MRN: [id number]  CC: Hypertension; Diabetes; Hyperlipidemia; and Coronary Artery Disease  NEW TO ME  HPI Timothy Snyder presents for establishing with a new provider. He has a complex medical history as listed above. He complains over the last few months of a few episodes of shortness of breath on exertion as well as the development of edema around his ankles.   His blood sugars have been really well controlled and he denies any episodes of symptoms suspicious of high or low blood sugars. His last A1c was 6.0% and his sulfonylurea has been continued.  Outpatient Prescriptions Prior to Visit  Medication Sig Dispense Refill  . aspirin 81 MG tablet Take 81 mg by mouth daily.    . beta carotene w/minerals (OCUVITE) tablet Take 1 tablet by mouth at bedtime.     . carvedilol (COREG) 3.125 MG tablet TAKE 1 TABLET BY MOUTH TWICE DAILY WITH MEALS 60 tablet 3  . clonazePAM (KLONOPIN) 0.5 MG tablet Take 1 tablet (0.5 mg total) by mouth at bedtime. 30 tablet 1  . doxazosin (CARDURA) 4 MG tablet Take 1 tablet (4 mg total) by mouth daily. (Patient taking differently: Take 2 mg by mouth daily. ) 90 tablet 3  . esomeprazole (NEXIUM) 40 MG packet Take 40 mg by mouth daily before breakfast. 90 each 3  . finasteride (PROSCAR) 5 MG tablet Take 1 tablet (5 mg total) by mouth daily. 90 tablet 3  . glucose blood (PRODIGY NO CODING BLOOD GLUC) test strip 1 each by Other route daily. Use as instructed 100 each 1  . KRILL OIL PO Take 1 tablet by mouth daily.    Marland Kitchen lisinopril (PRINIVIL,ZESTRIL) 2.5 MG tablet TAKE 1 TABLET(2.5 MG) BY MOUTH DAILY 90 tablet 0  . Loratadine 10 MG CAPS Take 10 mg by mouth 2 (two) times daily.    . nitroGLYCERIN (NITROSTAT) 0.4 MG SL tablet Place 1 tablet (0.4 mg total) under the tongue every 5 (five) minutes x 3 doses as needed for chest pain. 25 tablet 3  . pravastatin (PRAVACHOL) 20 MG tablet TAKE 1  TABLET BY MOUTH EVERY EVENING 30 tablet 3  . PRODIGY LANCETS 26G MISC 1 each by Does not apply route daily. Dx code 250.00 100 each 1  . ticagrelor (BRILINTA) 90 MG TABS tablet Take 1 tablet (90 mg total) by mouth 2 (two) times daily. 180 tablet 3  . timolol (TIMOPTIC-XR) 0.5 % ophthalmic gel-forming Place 1 drop into both eyes daily.     . diphenhydramine-acetaminophen (TYLENOL PM) 25-500 MG TABS Take 1 tablet by mouth at bedtime.     Marland Kitchen glimepiride (AMARYL) 2 MG tablet TAKE 1 TABLET(2 MG) BY MOUTH DAILY WITH BREAKFAST 90 tablet 0   No facility-administered medications prior to visit.    ROS Review of Systems  Constitutional: Negative.  Negative for fever, chills, diaphoresis, appetite change and fatigue.  HENT: Negative.   Eyes: Negative.   Respiratory: Positive for shortness of breath. Negative for apnea, cough, choking, chest tightness, wheezing and stridor.   Cardiovascular: Positive for leg swelling. Negative for chest pain and palpitations.  Gastrointestinal: Negative.  Negative for nausea, vomiting, abdominal pain, diarrhea, constipation and blood in stool.  Endocrine: Negative.  Negative for polydipsia, polyphagia and polyuria.  Genitourinary: Negative.   Musculoskeletal: Negative.  Negative for myalgias, back pain, arthralgias and neck pain.  Skin: Negative.  Negative for color change and rash.  Allergic/Immunologic: Negative.   Neurological: Negative.  Negative for dizziness, tremors, syncope, light-headedness, numbness and headaches.  Hematological: Negative.  Negative for adenopathy. Does not bruise/bleed easily.  Psychiatric/Behavioral: Negative for sleep disturbance and dysphoric mood. The patient is nervous/anxious.     Objective:  BP 130/62 mmHg  Pulse 66  Temp(Src) 97.8 F (36.6 C) (Oral)  Resp 16  Wt 198 lb (89.812 kg)  SpO2 97%  BP Readings from Last 3 Encounters:  04/21/15 122/60  04/20/15 130/62  12/23/14 124/62    Wt Readings from Last 3 Encounters:    04/21/15 198 lb (89.812 kg)  04/20/15 198 lb (89.812 kg)  12/23/14 193 lb (87.544 kg)    Physical Exam  Constitutional: He is oriented to person, place, and time. No distress.  HENT:  Mouth/Throat: Oropharynx is clear and moist. No oropharyngeal exudate.  Eyes: Conjunctivae are normal. Right eye exhibits no discharge. Left eye exhibits no discharge. No scleral icterus.  Neck: Normal range of motion. Neck supple. No JVD present. No tracheal deviation present. No thyromegaly present.  Cardiovascular: Normal rate, regular rhythm, normal heart sounds and intact distal pulses.  Exam reveals no gallop and no friction rub.   No murmur heard. EKG -  Sinus  Rhythm  -First degree A-V block  PRi = 226 -Poor R-wave progression -nonspecific -consider old anterior infarct.  Q wave in III is new when compared to EKG from 09/2014  BORDERLINE  Pulmonary/Chest: Effort normal and breath sounds normal. No stridor. No respiratory distress. He has no wheezes. He has no rales. He exhibits no tenderness.  Abdominal: Soft. Bowel sounds are normal. He exhibits no distension and no mass. There is no tenderness. There is no rebound and no guarding.  Musculoskeletal: Normal range of motion. He exhibits no edema or tenderness.  Lymphadenopathy:    He has no cervical adenopathy.  Neurological: He is oriented to person, place, and time.  Skin: Skin is warm and dry. No rash noted. He is not diaphoretic. No erythema. No pallor.  Vitals reviewed.   Lab Results  Component Value Date   WBC 8.5 09/22/2014   HGB 14.4 09/22/2014   HCT 41.4 09/22/2014   PLT 122* 09/22/2014   GLUCOSE 124* 04/20/2015   CHOL 123 04/20/2015   TRIG 181.0* 04/20/2015   HDL 27.70* 04/20/2015   LDLDIRECT 93.1 09/29/2012   LDLCALC 59 04/20/2015   ALT 16 04/20/2015   AST 15 04/20/2015   NA 141 04/20/2015   K 3.9 04/20/2015   CL 107 04/20/2015   CREATININE 1.18 04/20/2015   BUN 18 04/20/2015   CO2 27 04/20/2015   TSH 1.04 04/20/2015    PSA 1.87 03/31/2012   INR 1.20 09/21/2014   HGBA1C 6.5 04/20/2015   MICROALBUR <0.7 04/20/2015    Dg Chest 2 View  09/20/2014  CLINICAL DATA:  Chest pain and shortness of breath for 4 days EXAM: CHEST  2 VIEW COMPARISON:  January 14, 2014 FINDINGS: There is a calcified granuloma in the lateral left base. Interstitium is mildly prominent without edema or consolidation. Heart size and pulmonary vascularity are within normal limits. No adenopathy. There is mild degenerative change in the thoracic spine. IMPRESSION: Granuloma left base, calcified and stable. No edema or consolidation. Mild generalized interstitial prominence is a stable finding, probably reflecting chronic inflammatory type change. Electronically Signed   By: Lowella Grip III M.D.   On: 09/20/2014 15:12    Assessment & Plan:   Heywood was seen today for hypertension, diabetes, hyperlipidemia  and coronary artery disease.  Diagnoses and all orders for this visit:  Controlled type 2 diabetes mellitus with complication, without long-term current use of insulin ([place])- His blood sugars are slightly over controlled and I'm concerned about him developing episodes of hypoglycemia so Avastin to stop taking the sulfonylurea. -     Hemoglobin A1c; Future -     Basic metabolic panel; Future -     Microalbumin / creatinine urine ratio; Future  Coronary artery disease involving native coronary artery of native heart without angina pectoris- he tells me that he has a few new symptoms and it appears that his EKG may have a subtle change, he will see his cardiologist soon. -     Lipid panel; Future -     EKG 12-Lead -     Troponin I; Future -     Ambulatory referral to Cardiology  Essential hypertension- blood pressure is well-controlled, electrolytes and renal function are stable. -     TSH; Future -     Basic metabolic panel; Future -     EKG 12-Lead -     Urinalysis, Routine w reflex microscopic (not at [facility]);  Future  Hyperlipidemia with target LDL less than 70- he is achieved his LDL goal is tolerating his statin well. -     Lipid panel; Future -     TSH; Future -     Hepatic function panel; Future  Localized edema- this is most likely dependent edema from sitting upright for long periods of time, his BNP is not consistent with fluid retention or heart failure, other labs do not show any secondary metabolic causes for edema. He will follow with his cardiologist to see if further evaluation for heart failure as indicated. -     Urinalysis, Routine w reflex microscopic (not at [facility]); Future -     Brain natriuretic peptide; Future -     Troponin I; Future -     Ambulatory referral to Cardiology   I have discontinued Mr. Bramblett diphenhydramine-acetaminophen and glimepiride. I am also having him maintain his timolol, beta carotene w/minerals, aspirin, glucose blood, PRODIGY LANCETS 26G, Loratadine, esomeprazole, finasteride, doxazosin, KRILL OIL PO, nitroGLYCERIN, ticagrelor, pravastatin, clonazePAM, lisinopril, carvedilol, and timolol.  Meds ordered this encounter  Medications  . timolol (TIMOPTIC) 0.5 % ophthalmic solution    Sig: Place 1 drop into both eyes 2 (two) times daily.    Refill:  2     Follow-up: Return in about 6 months (around 10/21/2015).  Scarlette Calico, MD

## 2015-04-20 NOTE — Progress Notes (Signed)
Pre visit review using our clinic review tool, if applicable. No additional management support is needed unless otherwise documented below in the visit note. 

## 2015-04-21 ENCOUNTER — Encounter: Payer: Self-pay | Admitting: Cardiology

## 2015-04-21 ENCOUNTER — Ambulatory Visit (INDEPENDENT_AMBULATORY_CARE_PROVIDER_SITE_OTHER): Payer: PPO | Admitting: Cardiology

## 2015-04-21 VITALS — BP 122/60 | HR 64 | Ht 68.0 in | Wt 198.0 lb

## 2015-04-21 DIAGNOSIS — E785 Hyperlipidemia, unspecified: Secondary | ICD-10-CM

## 2015-04-21 DIAGNOSIS — I2510 Atherosclerotic heart disease of native coronary artery without angina pectoris: Secondary | ICD-10-CM | POA: Diagnosis not present

## 2015-04-21 DIAGNOSIS — I10 Essential (primary) hypertension: Secondary | ICD-10-CM | POA: Diagnosis not present

## 2015-04-21 NOTE — Progress Notes (Signed)
Cardiology Office Note   Date:  04/21/2015   ID:  Timothy Snyder, DOB 04-10-1927, MRN [id number]  PCP:  Scarlette Calico, MD  Cardiologist:  Dr. Keionte Swicegood [place]     Chief Complaint  Patient presents with  . Follow-up    ABNORMAL ekg 04/20/14     History of Present Illness: Timothy Snyder is a 82 y.o. male with a hx of diabetes, HTN, HL, CKD stage III, macular degeneration (blind).  Admitted 09/20/14 with a non-STEMI. LHC demonstrated single-vessel disease with 99% mid LAD lesion treated with a Synergy DES. Echocardiogram demonstrated normal LV function.    Here with his son.  He denies chest pain, syncope, dizziness, shortness of breath, orthopnea, edema.   He is active. Seen by Dr. Ronnald Ramp yesterday and concerned raised about Q wave in lead 3.    Studies/Reports Reviewed Today:  Echo 09/22/14 EF 55-60%, mild HK of the apical myocardium, grade 2 diastolic dysfunction, moderate to severe LAE.  LHC 09/22/14 LAD: Proximal to mid 50%, mid 99% LCx: 10% RCA: 10% PCI: 3 x 32 mm Synergy DES to the mid LAD 1. Single vessel obstructive CAD 2. Successful PCI of the proximal to mid LAD with a DES. Recommendation: DAPT for one year. Check Echo for LV function. Possible DC tomorrow if no complications.   Past Medical History  Diagnosis Date  . Actinic keratosis   . ARTHRITIS   . Macular degeneration     L>R vision loss, legally blind  . Restless leg syndrome   . BPH (benign prostatic hyperplasia)   . HYPERTENSION   . GLUCOMA   . GERD (gastroesophageal reflux disease)   . DIABETES MELLITUS, TYPE II   . CAD (coronary artery disease)     cath 09/21/2014 1v dx, 99% mid LAD treated with DES, 50% prox to mid LAD.    Past Surgical History  Procedure Laterality Date  . No past surgeries    . Cardiac catheterization N/A 09/21/2014    Procedure: Left Heart Cath and Coronary Angiography;  Surgeon: Spiro Ausborn M [place], MD;  Location: [facility];  Service: Cardiovascular;  Laterality: N/A;  .  Cardiac catheterization  09/21/2014    Procedure: Coronary Stent Intervention;  Surgeon: Lanita Stammen M [place], MD;  Location: [facility];  Service: Cardiovascular;;     Current Outpatient Prescriptions  Medication Sig Dispense Refill  . aspirin 81 MG tablet Take 81 mg by mouth daily.    . beta carotene w/minerals (OCUVITE) tablet Take 1 tablet by mouth at bedtime.     . carvedilol (COREG) 3.125 MG tablet TAKE 1 TABLET BY MOUTH TWICE DAILY WITH MEALS 60 tablet 3  . clonazePAM (KLONOPIN) 0.5 MG tablet Take 1 tablet (0.5 mg total) by mouth at bedtime. 30 tablet 1  . doxazosin (CARDURA) 4 MG tablet Take 1 tablet (4 mg total) by mouth daily. (Patient taking differently: Take 2 mg by mouth daily. ) 90 tablet 3  . esomeprazole (NEXIUM) 40 MG packet Take 40 mg by mouth daily before breakfast. 90 each 3  . finasteride (PROSCAR) 5 MG tablet Take 1 tablet (5 mg total) by mouth daily. 90 tablet 3  . glucose blood (PRODIGY NO CODING BLOOD GLUC) test strip 1 each by Other route daily. Use as instructed 100 each 1  . KRILL OIL PO Take 1 tablet by mouth daily.    Marland Kitchen lisinopril (PRINIVIL,ZESTRIL) 2.5 MG tablet TAKE 1 TABLET(2.5 MG) BY MOUTH DAILY 90 tablet 0  . Loratadine  10 MG CAPS Take 10 mg by mouth 2 (two) times daily.    . nitroGLYCERIN (NITROSTAT) 0.4 MG SL tablet Place 1 tablet (0.4 mg total) under the tongue every 5 (five) minutes x 3 doses as needed for chest pain. 25 tablet 3  . pravastatin (PRAVACHOL) 20 MG tablet TAKE 1 TABLET BY MOUTH EVERY EVENING 30 tablet 3  . PRODIGY LANCETS 26G MISC 1 each by Does not apply route daily. Dx code 250.00 100 each 1  . ticagrelor (BRILINTA) 90 MG TABS tablet Take 1 tablet (90 mg total) by mouth 2 (two) times daily. 180 tablet 3  . timolol (TIMOPTIC) 0.5 % ophthalmic solution Place 1 drop into both eyes 2 (two) times daily.  2  . timolol (TIMOPTIC-XR) 0.5 % ophthalmic gel-forming Place 1 drop into both eyes daily.     . [DISCONTINUED] bimatoprost (LUMIGAN) 0.03 %  ophthalmic solution Place 1 drop into both eyes at bedtime.       No current facility-administered medications for this visit.    Allergies:   Brinzolamide-brimonidine and Other    Social History:  The patient  reports that he has never smoked. He has never used smokeless tobacco. He reports that he does not drink alcohol or use illicit drugs.   Family History:  The patient's family history includes Cancer in his father; Coronary artery disease (age of onset: 103) in his mother; Heart failure in his mother.    ROS:   Please see the history of present illness.   Review of Systems  All other systems reviewed and are negative.     PHYSICAL EXAM: VS:  BP 122/60 mmHg  Pulse 64  Ht 5\' 8"  (1.727 m)  Wt 89.812 kg (198 lb)  BMI 30.11 kg/m2    Wt Readings from Last 3 Encounters:  04/21/15 89.812 kg (198 lb)  04/20/15 89.812 kg (198 lb)  12/23/14 87.544 kg (193 lb)     GEN: Well nourished, well developed, in no acute distress HEENT: normal Neck: no JVD,   no masses Cardiac:  Normal S1/S2, RRR; no murmur ,  no rubs or gallops, no edema;  right wrist without hematoma or mass  Respiratory:  clear to auscultation bilaterally, no wheezing, rhonchi or rales. GI: soft, nontender, nondistended, + BS MS: no deformity or atrophy Skin: warm and dry  Neuro:  CNs II-XII intact, Strength and sensation are intact Psych: Normal affect   EKG:  EKG is not ordered today.  Lab Results  Component Value Date   WBC 8.5 09/22/2014   HGB 14.4 09/22/2014   HCT 41.4 09/22/2014   PLT 122* 09/22/2014   GLUCOSE 124* 04/20/2015   CHOL 123 04/20/2015   TRIG 181.0* 04/20/2015   HDL 27.70* 04/20/2015   LDLDIRECT 93.1 09/29/2012   LDLCALC 59 04/20/2015   ALT 16 04/20/2015   AST 15 04/20/2015   NA 141 04/20/2015   K 3.9 04/20/2015   CL 107 04/20/2015   CREATININE 1.18 04/20/2015   BUN 18 04/20/2015   CO2 27 04/20/2015   TSH 1.04 04/20/2015   PSA 1.87 03/31/2012   INR 1.20 09/21/2014   HGBA1C 6.5  04/20/2015   MICROALBUR <0.7 04/20/2015    Ecg today shows NSR with first degree AV block. Isoelectric in lead 3. No pathologic Q wave. I have personally reviewed and interpreted this study.   ASSESSMENT AND PLAN:  1. CAD: Status post non-STEMI treated with Synergy DES to the LAD. LV function normal by echocardiogram post PCI. He  is doing well. No further angina.  Continue aspirin, Brilinta, beta blocker. Continue statin. Plan to stop Brilinta at one year.   2. Hypertension:  Controlled.   3. Hyperlipidemia:  Excellent control.  4. Diabetes: Follow-up with primary care. A1C 6%.   5. Chronic Kidney Disease: stage 2.      Medication Changes: Current medicines are reviewed at length with the patient today.  Concerns regarding medicines are as outlined above.  The following changes have been made:   Discontinued Medications   No medications on file   Modified Medications   No medications on file   New Prescriptions   No medications on file    Labs/ tests ordered today include:   Orders Placed This Encounter  Procedures  . EKG 12-Lead     Disposition:   FU with Dr. Brynli Ollis [place] 6 months    Signed, Merilyn Pagan [place] MD, [facility] 04/21/2015 9:19 AM

## 2015-04-21 NOTE — Patient Instructions (Signed)
Continue your current therapy  I will see you in 6 months.   

## 2015-04-25 LAB — HM DIABETES EYE EXAM

## 2015-06-12 ENCOUNTER — Ambulatory Visit: Payer: PPO | Admitting: Internal Medicine

## 2015-06-13 ENCOUNTER — Encounter: Payer: Self-pay | Admitting: Internal Medicine

## 2015-06-13 ENCOUNTER — Ambulatory Visit (INDEPENDENT_AMBULATORY_CARE_PROVIDER_SITE_OTHER): Payer: PPO | Admitting: Internal Medicine

## 2015-06-13 VITALS — BP 126/62 | HR 69 | Temp 97.6°F | Resp 16 | Ht 68.0 in | Wt 193.0 lb

## 2015-06-13 DIAGNOSIS — H6981 Other specified disorders of Eustachian tube, right ear: Secondary | ICD-10-CM

## 2015-06-13 DIAGNOSIS — H6990 Unspecified Eustachian tube disorder, unspecified ear: Secondary | ICD-10-CM | POA: Insufficient documentation

## 2015-06-13 DIAGNOSIS — J0100 Acute maxillary sinusitis, unspecified: Secondary | ICD-10-CM

## 2015-06-13 DIAGNOSIS — H6980 Other specified disorders of Eustachian tube, unspecified ear: Secondary | ICD-10-CM | POA: Insufficient documentation

## 2015-06-13 DIAGNOSIS — E118 Type 2 diabetes mellitus with unspecified complications: Secondary | ICD-10-CM | POA: Diagnosis not present

## 2015-06-13 LAB — POCT GLUCOSE (DEVICE FOR HOME USE): POC GLUCOSE: 241 mg/dL — AB (ref 70–99)

## 2015-06-13 MED ORDER — CEFDINIR 300 MG PO CAPS: 300.0000 mg | ORAL_CAPSULE | Freq: Two times a day (BID) | ORAL | Status: AC

## 2015-06-13 MED ORDER — LINAGLIPTIN-METFORMIN HCL 2.5-500 MG PO TABS: 1.0000 | ORAL_TABLET | Freq: Two times a day (BID) | ORAL | Status: DC

## 2015-06-13 MED ORDER — METHYLPREDNISOLONE ACETATE 80 MG/ML IJ SUSP
120.0000 mg | Freq: Once | INTRAMUSCULAR | Status: AC
Start: 2015-06-13 — End: 2015-06-13
  Administered 2015-06-13: 120 mg via INTRAMUSCULAR

## 2015-06-13 NOTE — Progress Notes (Signed)
Subjective:  Patient ID: Timothy Snyder, male    DOB: [birth year]  Age: 82 y.o. MRN: [id number]  CC: Sinusitis and Diabetes   HPI Ramadan LESTAT KAMPFER presents for a five-week history of right ear pain, right facial pain, cough productive of yellow mucus, nasal phlegm that is thick and yellow, mild intermittent sore throat, but no fevers/chills/night sweats/shortness of breath/chest pain or wheezing. He describes the discomfort in his right ear as an intermittent crackling and popping sensation. It does not affect his hearing and he denies dizziness or vertigo.  He is also concerned that his blood sugar is not well controlled, he has had some recent blood sugar spikes up to nearly 200. When I last saw him his A1c was down to 6.5% and he was only taking the sulfonylurea. He was having symptoms at that time that were suspicious for hypoglycemia so the sulfonylurea had been discontinued.   Outpatient Prescriptions Prior to Visit  Medication Sig Dispense Refill  . aspirin 81 MG tablet Take 81 mg by mouth daily.    . beta carotene w/minerals (OCUVITE) tablet Take 1 tablet by mouth at bedtime.     . carvedilol (COREG) 3.125 MG tablet TAKE 1 TABLET BY MOUTH TWICE DAILY WITH MEALS 60 tablet 3  . clonazePAM (KLONOPIN) 0.5 MG tablet Take 1 tablet (0.5 mg total) by mouth at bedtime. 30 tablet 1  . doxazosin (CARDURA) 4 MG tablet Take 1 tablet (4 mg total) by mouth daily. (Patient taking differently: Take 2 mg by mouth daily. ) 90 tablet 3  . esomeprazole (NEXIUM) 40 MG packet Take 40 mg by mouth daily before breakfast. 90 each 3  . finasteride (PROSCAR) 5 MG tablet Take 1 tablet (5 mg total) by mouth daily. 90 tablet 3  . glucose blood (PRODIGY NO CODING BLOOD GLUC) test strip 1 each by Other route daily. Use as instructed 100 each 1  . KRILL OIL PO Take 1 tablet by mouth daily.    Marland Kitchen lisinopril (PRINIVIL,ZESTRIL) 2.5 MG tablet TAKE 1 TABLET(2.5 MG) BY MOUTH DAILY 90 tablet 0  . Loratadine 10 MG CAPS Take  10 mg by mouth 2 (two) times daily.    . nitroGLYCERIN (NITROSTAT) 0.4 MG SL tablet Place 1 tablet (0.4 mg total) under the tongue every 5 (five) minutes x 3 doses as needed for chest pain. 25 tablet 3  . pravastatin (PRAVACHOL) 20 MG tablet TAKE 1 TABLET BY MOUTH EVERY EVENING 30 tablet 3  . PRODIGY LANCETS 26G MISC 1 each by Does not apply route daily. Dx code 250.00 100 each 1  . ticagrelor (BRILINTA) 90 MG TABS tablet Take 1 tablet (90 mg total) by mouth 2 (two) times daily. 180 tablet 3  . timolol (TIMOPTIC) 0.5 % ophthalmic solution Place 1 drop into both eyes 2 (two) times daily.  2  . timolol (TIMOPTIC-XR) 0.5 % ophthalmic gel-forming Place 1 drop into both eyes daily.      No facility-administered medications prior to visit.    ROS Review of Systems  Constitutional: Negative.  Negative for fever, chills, diaphoresis and fatigue.  HENT: Positive for congestion, ear pain, rhinorrhea and sinus pressure. Negative for ear discharge, facial swelling, postnasal drip, sneezing, sore throat, tinnitus and trouble swallowing.   Eyes: Negative.   Respiratory: Positive for cough. Negative for choking, chest tightness, shortness of breath and stridor.   Cardiovascular: Negative.  Negative for chest pain, palpitations and leg swelling.  Gastrointestinal: Negative.  Negative for nausea, vomiting, abdominal pain,  diarrhea and constipation.  Endocrine: Negative.  Negative for polydipsia, polyphagia and polyuria.  Genitourinary: Negative.  Negative for difficulty urinating.  Musculoskeletal: Negative.   Skin: Negative.   Allergic/Immunologic: Negative.   Neurological: Negative.  Negative for dizziness, tremors, weakness, light-headedness, numbness and headaches.  Hematological: Negative.   Psychiatric/Behavioral: Negative.     Objective:  BP 126/62 mmHg  Pulse 69  Temp(Src) 97.6 F (36.4 C) (Oral)  Resp 16  Ht 5\' 8"  (1.727 m)  Wt 193 lb (87.544 kg)  BMI 29.35 kg/m2  SpO2 95%  BP  Readings from Last 3 Encounters:  06/13/15 126/62  04/21/15 122/60  04/20/15 130/62    Wt Readings from Last 3 Encounters:  06/13/15 193 lb (87.544 kg)  04/21/15 198 lb (89.812 kg)  04/20/15 198 lb (89.812 kg)    Physical Exam  Constitutional: He is oriented to person, place, and time. No distress.  HENT:  Right Ear: Hearing, tympanic membrane, external ear and ear canal normal. Tympanic membrane is not erythematous and not retracted. No middle ear effusion.  Left Ear: Hearing, external ear and ear canal normal. Tympanic membrane is not erythematous and not retracted.  No middle ear effusion.  Nose: No mucosal edema or rhinorrhea. Right sinus exhibits maxillary sinus tenderness. Right sinus exhibits no frontal sinus tenderness. Left sinus exhibits no maxillary sinus tenderness and no frontal sinus tenderness.  Mouth/Throat: Oropharynx is clear and moist and mucous membranes are normal. Mucous membranes are not pale, not dry and not cyanotic. No oral lesions. No trismus in the jaw. No uvula swelling. No oropharyngeal exudate, posterior oropharyngeal edema, posterior oropharyngeal erythema or tonsillar abscesses.  Eyes: Conjunctivae are normal. Right eye exhibits no discharge. Left eye exhibits no discharge. No scleral icterus.  Neck: Normal range of motion. Neck supple. No JVD present. No tracheal deviation present. No thyromegaly present.  Cardiovascular: Normal rate, regular rhythm, normal heart sounds and intact distal pulses.  Exam reveals no gallop and no friction rub.   No murmur heard. Pulmonary/Chest: Effort normal and breath sounds normal. No stridor. No respiratory distress. He has no wheezes. He has no rales. He exhibits no tenderness.  Abdominal: Soft. Bowel sounds are normal. He exhibits no distension and no mass. There is no tenderness. There is no rebound and no guarding.  Musculoskeletal: Normal range of motion. He exhibits no edema or tenderness.  Lymphadenopathy:    He has  no cervical adenopathy.  Neurological: He is oriented to person, place, and time.  Skin: Skin is warm and dry. No rash noted. He is not diaphoretic. No erythema. No pallor.  Vitals reviewed.   Lab Results  Component Value Date   WBC 8.5 09/22/2014   HGB 14.4 09/22/2014   HCT 41.4 09/22/2014   PLT 122* 09/22/2014   GLUCOSE 124* 04/20/2015   CHOL 123 04/20/2015   TRIG 181.0* 04/20/2015   HDL 27.70* 04/20/2015   LDLDIRECT 93.1 09/29/2012   LDLCALC 59 04/20/2015   ALT 16 04/20/2015   AST 15 04/20/2015   NA 141 04/20/2015   K 3.9 04/20/2015   CL 107 04/20/2015   CREATININE 1.18 04/20/2015   BUN 18 04/20/2015   CO2 27 04/20/2015   TSH 1.04 04/20/2015   PSA 1.87 03/31/2012   INR 1.20 09/21/2014   HGBA1C 6.5 04/20/2015   MICROALBUR <0.7 04/20/2015    Dg Chest 2 View  09/20/2014  CLINICAL DATA:  Chest pain and shortness of breath for 4 days EXAM: CHEST  2 VIEW COMPARISON:  January 14, 2014 FINDINGS: There is a calcified granuloma in the lateral left base. Interstitium is mildly prominent without edema or consolidation. Heart size and pulmonary vascularity are within normal limits. No adenopathy. There is mild degenerative change in the thoracic spine. IMPRESSION: Granuloma left base, calcified and stable. No edema or consolidation. Mild generalized interstitial prominence is a stable finding, probably reflecting chronic inflammatory type change. Electronically Signed   By: Lowella Grip III M.D.   On: 09/20/2014 15:12    Assessment & Plan:   Arnel was seen today for sinusitis and diabetes.  Diagnoses and all orders for this visit:  Eustachian tube dysfunction, right- will treat eustachian tube symptoms with an injection of Depo-Medrol. -     methylPREDNISolone acetate (DEPO-MEDROL) injection 120 mg; Inject 1.5 mLs (120 mg total) into the muscle once.  Acute maxillary sinusitis, recurrence not specified- will treat the infection with Cefdinir and help control his symptoms  with a Depo-Medrol injection -     cefdinir (OMNICEF) 300 MG capsule; Take 1 capsule (300 mg total) by mouth 2 (two) times daily. -     methylPREDNISolone acetate (DEPO-MEDROL) injection 120 mg; Inject 1.5 mLs (120 mg total) into the muscle once.  Controlled type 2 diabetes mellitus with complication, without long-term current use of insulin ([place])- I think it would be best to treat him with a combination of metformin and a DPP 4 inhibitor. -     Linagliptin-Metformin HCl 2.5-500 MG TABS; Take 1 tablet by mouth 2 (two) times daily. -     POCT Glucose (Device for Home Use)   I am having Mr. Tindell start on cefdinir and Linagliptin-Metformin HCl. I am also having him maintain his timolol, beta carotene w/minerals, aspirin, glucose blood, PRODIGY LANCETS 26G, Loratadine, esomeprazole, finasteride, doxazosin, KRILL OIL PO, nitroGLYCERIN, ticagrelor, pravastatin, clonazePAM, lisinopril, carvedilol, and timolol. We administered methylPREDNISolone acetate.  Meds ordered this encounter  Medications  . cefdinir (OMNICEF) 300 MG capsule    Sig: Take 1 capsule (300 mg total) by mouth 2 (two) times daily.    Dispense:  28 capsule    Refill:  1  . Linagliptin-Metformin HCl 2.5-500 MG TABS    Sig: Take 1 tablet by mouth 2 (two) times daily.    Dispense:  180 tablet    Refill:  1  . methylPREDNISolone acetate (DEPO-MEDROL) injection 120 mg    Sig:      Follow-up: Return in about 3 months (around 09/12/2015).  Scarlette Calico, MD

## 2015-06-13 NOTE — Patient Instructions (Signed)

## 2015-06-13 NOTE — Progress Notes (Signed)
Pre visit review using our clinic review tool, if applicable. No additional management support is needed unless otherwise documented below in the visit note. 

## 2015-06-16 DIAGNOSIS — H401133 Primary open-angle glaucoma, bilateral, severe stage: Secondary | ICD-10-CM | POA: Diagnosis not present

## 2015-06-16 DIAGNOSIS — H353133 Nonexudative age-related macular degeneration, bilateral, advanced atrophic without subfoveal involvement: Secondary | ICD-10-CM | POA: Diagnosis not present

## 2015-06-16 DIAGNOSIS — E119 Type 2 diabetes mellitus without complications: Secondary | ICD-10-CM | POA: Diagnosis not present

## 2015-06-16 DIAGNOSIS — H31011 Macula scars of posterior pole (postinflammatory) (post-traumatic), right eye: Secondary | ICD-10-CM | POA: Diagnosis not present

## 2015-06-28 ENCOUNTER — Other Ambulatory Visit: Payer: Self-pay | Admitting: Family

## 2015-06-28 ENCOUNTER — Other Ambulatory Visit: Payer: Self-pay | Admitting: Physician Assistant

## 2015-06-29 NOTE — Telephone Encounter (Signed)
Rx(s) sent to pharmacy electronically.  

## 2015-06-29 NOTE — Telephone Encounter (Signed)
Review for refill, Thank you. 

## 2015-07-01 ENCOUNTER — Other Ambulatory Visit: Payer: Self-pay | Admitting: Family

## 2015-07-03 NOTE — Telephone Encounter (Signed)
Faxed script back to [facility].../lmb 

## 2015-07-31 ENCOUNTER — Encounter: Payer: Self-pay | Admitting: Cardiology

## 2015-07-31 ENCOUNTER — Other Ambulatory Visit: Payer: Self-pay | Admitting: Internal Medicine

## 2015-07-31 DIAGNOSIS — E118 Type 2 diabetes mellitus with unspecified complications: Secondary | ICD-10-CM

## 2015-07-31 DIAGNOSIS — I2510 Atherosclerotic heart disease of native coronary artery without angina pectoris: Secondary | ICD-10-CM

## 2015-07-31 DIAGNOSIS — Z794 Long term (current) use of insulin: Secondary | ICD-10-CM

## 2015-08-01 ENCOUNTER — Other Ambulatory Visit: Payer: Self-pay | Admitting: *Deleted

## 2015-08-01 MED ORDER — CARVEDILOL 3.125 MG PO TABS: 3.1250 mg | ORAL_TABLET | Freq: Two times a day (BID) | ORAL | Status: DC

## 2015-08-01 MED ORDER — LISINOPRIL 2.5 MG PO TABS: 2.5000 mg | ORAL_TABLET | Freq: Every day | ORAL | Status: DC

## 2015-08-01 NOTE — Addendum Note (Signed)
Addended by: Janith Lima on: 08/01/2015 10:23 AM   Modules accepted: Orders

## 2015-08-19 ENCOUNTER — Other Ambulatory Visit: Payer: Self-pay | Admitting: Physician Assistant

## 2015-08-24 DIAGNOSIS — D485 Neoplasm of uncertain behavior of skin: Secondary | ICD-10-CM | POA: Diagnosis not present

## 2015-08-24 DIAGNOSIS — Z85828 Personal history of other malignant neoplasm of skin: Secondary | ICD-10-CM | POA: Diagnosis not present

## 2015-08-24 DIAGNOSIS — D2272 Melanocytic nevi of left lower limb, including hip: Secondary | ICD-10-CM | POA: Diagnosis not present

## 2015-08-24 DIAGNOSIS — C44319 Basal cell carcinoma of skin of other parts of face: Secondary | ICD-10-CM | POA: Diagnosis not present

## 2015-08-24 DIAGNOSIS — L570 Actinic keratosis: Secondary | ICD-10-CM | POA: Diagnosis not present

## 2015-09-05 ENCOUNTER — Other Ambulatory Visit: Payer: Self-pay | Admitting: Physician Assistant

## 2015-09-14 ENCOUNTER — Other Ambulatory Visit: Payer: Self-pay | Admitting: Internal Medicine

## 2015-09-14 ENCOUNTER — Encounter: Payer: Self-pay | Admitting: Internal Medicine

## 2015-09-14 DIAGNOSIS — I2583 Coronary atherosclerosis due to lipid rich plaque: Principal | ICD-10-CM

## 2015-09-14 DIAGNOSIS — E785 Hyperlipidemia, unspecified: Secondary | ICD-10-CM

## 2015-09-14 DIAGNOSIS — I2510 Atherosclerotic heart disease of native coronary artery without angina pectoris: Secondary | ICD-10-CM

## 2015-09-14 DIAGNOSIS — E118 Type 2 diabetes mellitus with unspecified complications: Secondary | ICD-10-CM

## 2015-09-14 MED ORDER — PRAVASTATIN SODIUM 20 MG PO TABS: 20.0000 mg | ORAL_TABLET | Freq: Every evening | ORAL | 3 refills | Status: DC

## 2015-09-14 MED ORDER — GLIMEPIRIDE 2 MG PO TABS: 2.0000 mg | ORAL_TABLET | Freq: Every day | ORAL | 3 refills | Status: DC

## 2015-10-24 ENCOUNTER — Other Ambulatory Visit: Payer: Self-pay | Admitting: Internal Medicine

## 2015-10-26 ENCOUNTER — Other Ambulatory Visit (INDEPENDENT_AMBULATORY_CARE_PROVIDER_SITE_OTHER): Payer: PPO

## 2015-10-26 ENCOUNTER — Encounter: Payer: Self-pay | Admitting: Internal Medicine

## 2015-10-26 ENCOUNTER — Ambulatory Visit (INDEPENDENT_AMBULATORY_CARE_PROVIDER_SITE_OTHER): Payer: PPO | Admitting: Internal Medicine

## 2015-10-26 VITALS — BP 112/58 | HR 65 | Temp 97.7°F | Ht 68.0 in | Wt 195.2 lb

## 2015-10-26 DIAGNOSIS — I10 Essential (primary) hypertension: Secondary | ICD-10-CM

## 2015-10-26 DIAGNOSIS — Z23 Encounter for immunization: Secondary | ICD-10-CM | POA: Diagnosis not present

## 2015-10-26 DIAGNOSIS — E118 Type 2 diabetes mellitus with unspecified complications: Secondary | ICD-10-CM

## 2015-10-26 LAB — BASIC METABOLIC PANEL
BUN: 18 mg/dL (ref 6–23)
CALCIUM: 8.9 mg/dL (ref 8.4–10.5)
CO2: 28 meq/L (ref 19–32)
CREATININE: 1.3 mg/dL (ref 0.40–1.50)
Chloride: 104 mEq/L (ref 96–112)
GFR: 55.59 mL/min — AB (ref >60.00)
GLUCOSE: 160 mg/dL — AB (ref 70–99)
Potassium: 3.9 mEq/L (ref 3.5–5.1)
SODIUM: 138 meq/L (ref 135–145)

## 2015-10-26 LAB — HEMOGLOBIN A1C: Hgb A1c MFr Bld: 6.7 % — ABNORMAL HIGH (ref 4.6–6.5)

## 2015-10-26 NOTE — Progress Notes (Signed)
Pre visit review using our clinic review tool, if applicable. No additional management support is needed unless otherwise documented below in the visit note. 

## 2015-10-26 NOTE — Patient Instructions (Signed)

## 2015-10-26 NOTE — Progress Notes (Signed)
Subjective:  Patient ID: Timothy Snyder, male    DOB: September 29, 1927  Age: 82 y.o. MRN: [id number]  CC: Diabetes   HPI AFSHIN SCHEUMANN presents for Follow-up on diabetes mellitus. He feels well today and offers no new complaints.  Outpatient Medications Prior to Visit  Medication Sig Dispense Refill  . aspirin 81 MG tablet Take 81 mg by mouth daily.    . beta carotene w/minerals (OCUVITE) tablet Take 1 tablet by mouth at bedtime.     Marland Kitchen BRILINTA 90 MG TABS tablet TAKE 1 TABLET BY MOUTH TWICE DAILY 180 tablet 2  . carvedilol (COREG) 3.125 MG tablet Take 1 tablet (3.125 mg total) by mouth 2 (two) times daily with a meal. 180 tablet 1  . clonazePAM (KLONOPIN) 0.5 MG tablet TAKE 1 TABLET BY MOUTH AT BEDTIME 30 tablet 5  . doxazosin (CARDURA) 4 MG tablet Take 1 tablet (4 mg total) by mouth daily. (Patient taking differently: Take 2 mg by mouth daily. ) 90 tablet 3  . esomeprazole (NEXIUM) 40 MG packet Take 40 mg by mouth daily before breakfast. 90 each 3  . finasteride (PROSCAR) 5 MG tablet Take 1 tablet (5 mg total) by mouth daily. 90 tablet 3  . glimepiride (AMARYL) 2 MG tablet Take 1 tablet (2 mg total) by mouth daily with breakfast. 90 tablet 3  . glucose blood (PRODIGY NO CODING BLOOD GLUC) test strip 1 each by Other route daily. Use as instructed 100 each 1  . KRILL OIL PO Take 1 tablet by mouth daily.    Marland Kitchen lisinopril (PRINIVIL,ZESTRIL) 2.5 MG tablet Take 1 tablet (2.5 mg total) by mouth daily. 90 tablet 3  . Loratadine 10 MG CAPS Take 10 mg by mouth 2 (two) times daily.    . nitroGLYCERIN (NITROSTAT) 0.4 MG SL tablet Place 1 tablet (0.4 mg total) under the tongue every 5 (five) minutes x 3 doses as needed for chest pain. 25 tablet 3  . pravastatin (PRAVACHOL) 20 MG tablet Take 1 tablet (20 mg total) by mouth every evening. 90 tablet 3  . PRODIGY LANCETS 26G MISC 1 each by Does not apply route daily. Dx code 250.00 100 each 1  . timolol (TIMOPTIC) 0.5 % ophthalmic solution Place 1 drop  into both eyes 2 (two) times daily.  2  . timolol (TIMOPTIC-XR) 0.5 % ophthalmic gel-forming Place 1 drop into both eyes daily.      No facility-administered medications prior to visit.     ROS Review of Systems  Constitutional: Negative.  Negative for activity change, appetite change, chills, diaphoresis, fatigue, fever and unexpected weight change.  HENT: Negative.   Eyes: Negative.   Respiratory: Negative.  Negative for cough, choking, chest tightness, shortness of breath and stridor.   Cardiovascular: Negative.  Negative for chest pain, palpitations and leg swelling.  Gastrointestinal: Negative.  Negative for abdominal pain, constipation, diarrhea, nausea and vomiting.  Endocrine: Negative.  Negative for polydipsia and polyuria.  Genitourinary: Negative.   Musculoskeletal: Negative.  Negative for arthralgias, back pain, joint swelling and myalgias.  Skin: Negative.   Allergic/Immunologic: Negative.   Neurological: Negative.  Negative for dizziness, tremors, weakness, light-headedness, numbness and headaches.  Hematological: Negative.  Negative for adenopathy. Does not bruise/bleed easily.  Psychiatric/Behavioral: Negative.     Objective:  BP (!) 112/58 (BP Location: Left Arm, Patient Position: Sitting, Cuff Size: Normal)   Pulse 65   Temp 97.7 F (36.5 C) (Oral)   Ht 5\' 8"  (1.727 m)   Wt  195 lb 4 oz (88.6 kg)   SpO2 96%   BMI 29.69 kg/m   BP Readings from Last 3 Encounters:  10/26/15 (!) 112/58  06/13/15 126/62  04/21/15 122/60    Wt Readings from Last 3 Encounters:  10/26/15 195 lb 4 oz (88.6 kg)  06/13/15 193 lb (87.5 kg)  04/21/15 198 lb (89.8 kg)    Physical Exam  Constitutional: He is oriented to person, place, and time. No distress.  HENT:  Mouth/Throat: Oropharynx is clear and moist. No oropharyngeal exudate.  Eyes: Conjunctivae are normal. Right eye exhibits no discharge. Left eye exhibits no discharge. No scleral icterus.  Neck: Normal range of motion.  Neck supple. No JVD present. No tracheal deviation present. No thyromegaly present.  Cardiovascular: Normal rate, regular rhythm, normal heart sounds and intact distal pulses.  Exam reveals no gallop and no friction rub.   No murmur heard. Pulmonary/Chest: Effort normal and breath sounds normal. No stridor. No respiratory distress. He has no wheezes. He has no rales. He exhibits no tenderness.  Abdominal: Soft. Bowel sounds are normal. He exhibits no distension. There is no tenderness. There is no rebound and no guarding.  Musculoskeletal: Normal range of motion. He exhibits no edema, tenderness or deformity.  Lymphadenopathy:    He has no cervical adenopathy.  Neurological: He is oriented to person, place, and time.  Skin: Skin is warm and dry. No rash noted. He is not diaphoretic. No erythema. No pallor.  Psychiatric: He has a normal mood and affect. His behavior is normal. Judgment and thought content normal.  Vitals reviewed.   Lab Results  Component Value Date   WBC 8.5 09/22/2014   HGB 14.4 09/22/2014   HCT 41.4 09/22/2014   PLT 122 (L) 09/22/2014   GLUCOSE 160 (H) 10/26/2015   CHOL 123 04/20/2015   TRIG 181.0 (H) 04/20/2015   HDL 27.70 (L) 04/20/2015   LDLDIRECT 93.1 09/29/2012   LDLCALC 59 04/20/2015   ALT 16 04/20/2015   AST 15 04/20/2015   NA 138 10/26/2015   K 3.9 10/26/2015   CL 104 10/26/2015   CREATININE 1.30 10/26/2015   BUN 18 10/26/2015   CO2 28 10/26/2015   TSH 1.04 04/20/2015   PSA 1.87 03/31/2012   INR 1.20 09/21/2014   HGBA1C 6.7 (H) 10/26/2015   MICROALBUR <0.7 04/20/2015    Dg Chest 2 View  Result Date: 09/20/2014 CLINICAL DATA:  Chest pain and shortness of breath for 4 days EXAM: CHEST  2 VIEW COMPARISON:  January 14, 2014 FINDINGS: There is a calcified granuloma in the lateral left base. Interstitium is mildly prominent without edema or consolidation. Heart size and pulmonary vascularity are within normal limits. No adenopathy. There is mild  degenerative change in the thoracic spine. IMPRESSION: Granuloma left base, calcified and stable. No edema or consolidation. Mild generalized interstitial prominence is a stable finding, probably reflecting chronic inflammatory type change. Electronically Signed   By: Lowella Grip III M.D.   On: 09/20/2014 15:12    Assessment & Plan:   Jaree was seen today for diabetes.  Diagnoses and all orders for this visit:  Need for prophylactic vaccination and inoculation against influenza -     Flu vaccine HIGH DOSE PF (Fluzone High dose)  Essential hypertension- his blood pressures adequately well-controlled, electrolytes and renal function are stable. Will continue carvedilol, lisinopril, and doxazosin -     Basic metabolic panel; Future  Controlled type 2 diabetes mellitus with complication, without long-term current use of  insulin ([place])- his A1c is at 6.7%, his blood sugars are adequately well controlled, will continue the SU for blood sugar control -     Basic metabolic panel; Future -     Hemoglobin A1c; Future   I am having Mr. Mazon maintain his timolol, beta carotene w/minerals, aspirin, glucose blood, PRODIGY LANCETS 26G, Loratadine, esomeprazole, doxazosin, KRILL OIL PO, nitroGLYCERIN, timolol, BRILINTA, clonazePAM, lisinopril, carvedilol, pravastatin, glimepiride, and finasteride.  No orders of the defined types were placed in this encounter.    Follow-up: Return in about 6 months (around 04/24/2016).  Scarlette Calico, MD

## 2015-11-03 ENCOUNTER — Other Ambulatory Visit: Payer: Self-pay | Admitting: Internal Medicine

## 2015-11-08 NOTE — Progress Notes (Signed)
Cardiology Office Note   Date:  11/09/2015   ID:  Timothy Snyder, DOB 06-19-1927, MRN [id number]  PCP:  Scarlette Calico, MD  Cardiologist:  Dr. Peter Martinique     Chief Complaint  Patient presents with  . Coronary Artery Disease     History of Present Illness: Timothy Snyder is a 82 y.o. male with a hx of diabetes, HTN, HL, CKD stage III, macular degeneration (blind).  Admitted 09/20/14 with a non-STEMI. LHC demonstrated single-vessel disease with 99% mid LAD lesion treated with a Synergy DES. Echocardiogram demonstrated normal LV function.    Here with his son.  He denies chest pain, syncope, dizziness, shortness of breath, orthopnea, edema.   He is active- rides an exercise bike for 30 minutes a day.    Studies/Reports Reviewed Today:  Echo 09/22/14 EF 55-60%, mild HK of the apical myocardium, grade 2 diastolic dysfunction, moderate to severe LAE.  LHC 09/22/14 LAD: Proximal to mid 50%, mid 99% LCx: 10% RCA: 10% PCI: 3 x 32 mm Synergy DES to the mid LAD 1. Single vessel obstructive CAD 2. Successful PCI of the proximal to mid LAD with a DES. Recommendation: DAPT for one year. Check Echo for LV function. Possible DC tomorrow if no complications.   Past Medical History:  Diagnosis Date  . Actinic keratosis   . ARTHRITIS   . BPH (benign prostatic hyperplasia)   . CAD (coronary artery disease)    cath 09/21/2014 1v dx, 99% mid LAD treated with DES, 50% prox to mid LAD.  Timothy Snyder DIABETES MELLITUS, TYPE II   . GERD (gastroesophageal reflux disease)   . GLUCOMA   . HYPERTENSION   . Macular degeneration    L>R vision loss, legally blind  . Restless leg syndrome     Past Surgical History:  Procedure Laterality Date  . CARDIAC CATHETERIZATION N/A 09/21/2014   Procedure: Left Heart Cath and Coronary Angiography;  Surgeon: Peter M Martinique, MD;  Location: [facility];  Service: Cardiovascular;  Laterality: N/A;  . CARDIAC CATHETERIZATION  09/21/2014   Procedure: Coronary Stent  Intervention;  Surgeon: Peter M Martinique, MD;  Location: [facility];  Service: Cardiovascular;;  . NO PAST SURGERIES       Current Outpatient Prescriptions  Medication Sig Dispense Refill  . aspirin 81 MG tablet Take 81 mg by mouth daily.    . beta carotene w/minerals (OCUVITE) tablet Take 1 tablet by mouth at bedtime.     Timothy Snyder BRILINTA 90 MG TABS tablet TAKE 1 TABLET BY MOUTH TWICE DAILY 180 tablet 2  . carvedilol (COREG) 3.125 MG tablet Take 1 tablet (3.125 mg total) by mouth 2 (two) times daily with a meal. 180 tablet 1  . clonazePAM (KLONOPIN) 0.5 MG tablet TAKE 1 TABLET BY MOUTH AT BEDTIME 30 tablet 5  . doxazosin (CARDURA) 4 MG tablet TAKE ONE TABLET BY MOUTH ONCE DAILY 90 tablet 3  . esomeprazole (NEXIUM) 40 MG packet Take 40 mg by mouth daily before breakfast. 90 each 3  . finasteride (PROSCAR) 5 MG tablet Take 1 tablet (5 mg total) by mouth daily. 90 tablet 3  . glimepiride (AMARYL) 2 MG tablet Take 1 tablet (2 mg total) by mouth daily with breakfast. 90 tablet 3  . glucose blood (PRODIGY NO CODING BLOOD GLUC) test strip 1 each by Other route daily. Use as instructed 100 each 1  . KRILL OIL PO Take 1 tablet by mouth daily.    Timothy Snyder lisinopril (PRINIVIL,ZESTRIL)  2.5 MG tablet Take 1 tablet (2.5 mg total) by mouth daily. 90 tablet 3  . Loratadine 10 MG CAPS Take 10 mg by mouth 2 (two) times daily.    . nitroGLYCERIN (NITROSTAT) 0.4 MG SL tablet Place 1 tablet (0.4 mg total) under the tongue every 5 (five) minutes x 3 doses as needed for chest pain. 25 tablet 3  . pravastatin (PRAVACHOL) 20 MG tablet Take 1 tablet (20 mg total) by mouth every evening. 90 tablet 3  . PRODIGY LANCETS 26G MISC 1 each by Does not apply route daily. Dx code 250.00 100 each 1  . timolol (TIMOPTIC) 0.5 % ophthalmic solution Place 1 drop into both eyes 2 (two) times daily.  2  . timolol (TIMOPTIC-XR) 0.5 % ophthalmic gel-forming Place 1 drop into both eyes daily.      No current facility-administered  medications for this visit.     Allergies:   Brinzolamide-brimonidine and Other    Social History:  The patient  reports that he has never smoked. He has never used smokeless tobacco. He reports that he does not drink alcohol or use drugs.   Family History:  The patient's family history includes Cancer in his father; Coronary artery disease (age of onset: 56) in his mother; Heart failure in his mother.    ROS:   Please see the history of present illness.   Review of Systems  All other systems reviewed and are negative.     PHYSICAL EXAM: VS:  BP 119/61   Pulse 67   Ht 5\' 8"  (1.727 m)   Wt 192 lb 3.2 oz (87.2 kg)   SpO2 98%   BMI 29.22 kg/m     Wt Readings from Last 3 Encounters:  11/09/15 192 lb 3.2 oz (87.2 kg)  10/26/15 195 lb 4 oz (88.6 kg)  06/13/15 193 lb (87.5 kg)     GEN: Well nourished, well developed, in no acute distress  HEENT: normal  Neck: no JVD,   no masses Cardiac:  Normal S1/S2, RRR; no murmur ,  no rubs or gallops, no edema;  right wrist without hematoma or mass  Respiratory:  clear to auscultation bilaterally, no wheezing, rhonchi or rales. GI: soft, nontender, nondistended, + BS MS: no deformity or atrophy  Skin: warm and dry  Neuro:  CNs II-XII intact, Strength and sensation are intact Psych: Normal affect   EKG:  EKG is not ordered today.  Lab Results  Component Value Date   WBC 8.5 09/22/2014   HGB 14.4 09/22/2014   HCT 41.4 09/22/2014   PLT 122 (L) 09/22/2014   GLUCOSE 160 (H) 10/26/2015   CHOL 123 04/20/2015   TRIG 181.0 (H) 04/20/2015   HDL 27.70 (L) 04/20/2015   LDLDIRECT 93.1 09/29/2012   LDLCALC 59 04/20/2015   ALT 16 04/20/2015   AST 15 04/20/2015   NA 138 10/26/2015   K 3.9 10/26/2015   CL 104 10/26/2015   CREATININE 1.30 10/26/2015   BUN 18 10/26/2015   CO2 28 10/26/2015   TSH 1.04 04/20/2015   PSA 1.87 03/31/2012   INR 1.20 09/21/2014   HGBA1C 6.7 (H) 10/26/2015   MICROALBUR <0.7 04/20/2015    Ecg today shows NSR  with first degree AV block. Isoelectric in lead 3. No pathologic Q wave. I have personally reviewed and interpreted this study.   ASSESSMENT AND PLAN:  1. CAD: Status post non-STEMI treated with Synergy DES to the LAD. LV function normal by echocardiogram post PCI. He is  doing well. No further angina.  Continue aspirin, beta blocker. Continue statin. Can stop Brilinta now.  2. Hypertension:  Controlled.   3. Hyperlipidemia:  Excellent control.  4. Diabetes: Follow-up with primary care. A1C 6.7%.   5. Chronic Kidney Disease: stage 2.      Medication Changes: Current medicines are reviewed at length with the patient today.  Concerns regarding medicines are as outlined above.  The following changes have been made:   Discontinued Medications   No medications on file   Modified Medications   No medications on file   New Prescriptions   No medications on file    Labs/ tests ordered today include:   No orders of the defined types were placed in this encounter.    Disposition:   FU with Dr. Peter Martinique one year    Signed, Peter Martinique MD, [facility] 11/09/2015 9:22 AM

## 2015-11-09 ENCOUNTER — Encounter: Payer: Self-pay | Admitting: Cardiology

## 2015-11-09 ENCOUNTER — Ambulatory Visit (INDEPENDENT_AMBULATORY_CARE_PROVIDER_SITE_OTHER): Payer: PPO | Admitting: Cardiology

## 2015-11-09 VITALS — BP 119/61 | HR 67 | Ht 68.0 in | Wt 192.2 lb

## 2015-11-09 DIAGNOSIS — E785 Hyperlipidemia, unspecified: Secondary | ICD-10-CM | POA: Diagnosis not present

## 2015-11-09 DIAGNOSIS — I10 Essential (primary) hypertension: Secondary | ICD-10-CM

## 2015-11-09 DIAGNOSIS — I2510 Atherosclerotic heart disease of native coronary artery without angina pectoris: Secondary | ICD-10-CM | POA: Diagnosis not present

## 2015-11-09 DIAGNOSIS — C44319 Basal cell carcinoma of skin of other parts of face: Secondary | ICD-10-CM | POA: Diagnosis not present

## 2015-11-09 NOTE — Patient Instructions (Signed)
Stop taking Brilinta   Continue your other therapy  I will see you in one year.

## 2015-12-05 ENCOUNTER — Telehealth: Payer: Self-pay

## 2015-12-16 NOTE — Telephone Encounter (Signed)
error 

## 2015-12-22 DIAGNOSIS — H401133 Primary open-angle glaucoma, bilateral, severe stage: Secondary | ICD-10-CM | POA: Diagnosis not present

## 2015-12-30 ENCOUNTER — Encounter: Payer: Self-pay | Admitting: Internal Medicine

## 2015-12-30 DIAGNOSIS — Z794 Long term (current) use of insulin: Secondary | ICD-10-CM

## 2015-12-30 DIAGNOSIS — I2510 Atherosclerotic heart disease of native coronary artery without angina pectoris: Secondary | ICD-10-CM

## 2015-12-30 DIAGNOSIS — E118 Type 2 diabetes mellitus with unspecified complications: Secondary | ICD-10-CM

## 2016-01-01 MED ORDER — LISINOPRIL 2.5 MG PO TABS: 2.5000 mg | ORAL_TABLET | Freq: Every day | ORAL | 2 refills | Status: DC

## 2016-01-22 ENCOUNTER — Encounter: Payer: Self-pay | Admitting: Cardiology

## 2016-01-22 ENCOUNTER — Encounter: Payer: Self-pay | Admitting: Internal Medicine

## 2016-01-23 ENCOUNTER — Other Ambulatory Visit: Payer: Self-pay | Admitting: Internal Medicine

## 2016-01-23 MED ORDER — CLONAZEPAM 0.5 MG PO TABS: 0.5000 mg | ORAL_TABLET | Freq: Every day | ORAL | 5 refills | Status: DC

## 2016-01-25 ENCOUNTER — Other Ambulatory Visit: Payer: Self-pay | Admitting: Internal Medicine

## 2016-01-25 ENCOUNTER — Encounter: Payer: Self-pay | Admitting: Internal Medicine

## 2016-01-25 ENCOUNTER — Other Ambulatory Visit: Payer: Self-pay | Admitting: *Deleted

## 2016-01-25 DIAGNOSIS — I10 Essential (primary) hypertension: Secondary | ICD-10-CM

## 2016-01-25 DIAGNOSIS — I2510 Atherosclerotic heart disease of native coronary artery without angina pectoris: Secondary | ICD-10-CM

## 2016-01-25 DIAGNOSIS — I214 Non-ST elevation (NSTEMI) myocardial infarction: Secondary | ICD-10-CM

## 2016-01-25 MED ORDER — CARVEDILOL 3.125 MG PO TABS: 3.1250 mg | ORAL_TABLET | Freq: Two times a day (BID) | ORAL | 3 refills | Status: DC

## 2016-01-26 ENCOUNTER — Other Ambulatory Visit: Payer: Self-pay | Admitting: *Deleted

## 2016-01-26 DIAGNOSIS — I10 Essential (primary) hypertension: Secondary | ICD-10-CM

## 2016-01-26 DIAGNOSIS — I2510 Atherosclerotic heart disease of native coronary artery without angina pectoris: Secondary | ICD-10-CM

## 2016-01-26 DIAGNOSIS — I214 Non-ST elevation (NSTEMI) myocardial infarction: Secondary | ICD-10-CM

## 2016-01-26 MED ORDER — CARVEDILOL 3.125 MG PO TABS: 3.1250 mg | ORAL_TABLET | Freq: Two times a day (BID) | ORAL | 3 refills | Status: DC

## 2016-03-15 ENCOUNTER — Encounter: Payer: Self-pay | Admitting: Internal Medicine

## 2016-03-15 ENCOUNTER — Encounter: Payer: Self-pay | Admitting: Cardiology

## 2016-03-15 MED ORDER — OMEPRAZOLE 20 MG PO CPDR: 20.0000 mg | DELAYED_RELEASE_CAPSULE | Freq: Every day | ORAL | 1 refills | Status: DC

## 2016-03-18 ENCOUNTER — Encounter: Payer: Self-pay | Admitting: Internal Medicine

## 2016-03-19 ENCOUNTER — Other Ambulatory Visit: Payer: Self-pay | Admitting: Internal Medicine

## 2016-03-19 ENCOUNTER — Telehealth: Payer: Self-pay

## 2016-03-19 MED ORDER — CLONAZEPAM 0.5 MG PO TABS: 0.5000 mg | ORAL_TABLET | Freq: Every day | ORAL | 5 refills | Status: DC

## 2016-03-19 NOTE — Telephone Encounter (Signed)
error 

## 2016-03-22 ENCOUNTER — Encounter: Payer: Self-pay | Admitting: Internal Medicine

## 2016-03-22 DIAGNOSIS — Z794 Long term (current) use of insulin: Secondary | ICD-10-CM

## 2016-03-22 DIAGNOSIS — E118 Type 2 diabetes mellitus with unspecified complications: Secondary | ICD-10-CM

## 2016-03-22 DIAGNOSIS — I2510 Atherosclerotic heart disease of native coronary artery without angina pectoris: Secondary | ICD-10-CM

## 2016-03-25 MED ORDER — LISINOPRIL 2.5 MG PO TABS: 2.5000 mg | ORAL_TABLET | Freq: Every day | ORAL | 1 refills | Status: DC

## 2016-03-29 DIAGNOSIS — Z808 Family history of malignant neoplasm of other organs or systems: Secondary | ICD-10-CM | POA: Diagnosis not present

## 2016-03-29 DIAGNOSIS — Z23 Encounter for immunization: Secondary | ICD-10-CM | POA: Diagnosis not present

## 2016-03-29 DIAGNOSIS — L308 Other specified dermatitis: Secondary | ICD-10-CM | POA: Diagnosis not present

## 2016-03-29 DIAGNOSIS — Z85828 Personal history of other malignant neoplasm of skin: Secondary | ICD-10-CM | POA: Diagnosis not present

## 2016-03-29 DIAGNOSIS — L570 Actinic keratosis: Secondary | ICD-10-CM | POA: Diagnosis not present

## 2016-03-29 DIAGNOSIS — D2272 Melanocytic nevi of left lower limb, including hip: Secondary | ICD-10-CM | POA: Diagnosis not present

## 2016-04-01 ENCOUNTER — Encounter: Payer: Self-pay | Admitting: Internal Medicine

## 2016-04-02 ENCOUNTER — Other Ambulatory Visit (INDEPENDENT_AMBULATORY_CARE_PROVIDER_SITE_OTHER): Payer: PPO

## 2016-04-02 ENCOUNTER — Ambulatory Visit (INDEPENDENT_AMBULATORY_CARE_PROVIDER_SITE_OTHER)
Admission: RE | Admit: 2016-04-02 | Discharge: 2016-04-02 | Disposition: A | Discharge status: Home / Self Care | Payer: PPO | Source: Ambulatory Visit | Attending: Internal Medicine | Admitting: Internal Medicine

## 2016-04-02 ENCOUNTER — Encounter: Payer: Self-pay | Admitting: Internal Medicine

## 2016-04-02 ENCOUNTER — Ambulatory Visit (INDEPENDENT_AMBULATORY_CARE_PROVIDER_SITE_OTHER): Payer: PPO | Admitting: Internal Medicine

## 2016-04-02 VITALS — BP 124/60 | HR 60 | Temp 97.6°F | Resp 16 | Ht 68.0 in | Wt 206.2 lb

## 2016-04-02 DIAGNOSIS — R600 Localized edema: Secondary | ICD-10-CM

## 2016-04-02 DIAGNOSIS — R2243 Localized swelling, mass and lump, lower limb, bilateral: Secondary | ICD-10-CM | POA: Diagnosis not present

## 2016-04-02 DIAGNOSIS — I10 Essential (primary) hypertension: Secondary | ICD-10-CM

## 2016-04-02 LAB — COMPREHENSIVE METABOLIC PANEL
ALK PHOS: 58 U/L (ref 39–117)
ALT: 18 U/L (ref 0–53)
AST: 15 U/L (ref 0–37)
Albumin: 3.9 g/dL (ref 3.5–5.2)
BILIRUBIN TOTAL: 0.6 mg/dL (ref 0.2–1.2)
BUN: 15 mg/dL (ref 6–23)
CALCIUM: 8.9 mg/dL (ref 8.4–10.5)
CO2: 30 mEq/L (ref 19–32)
Chloride: 107 mEq/L (ref 96–112)
Creatinine, Ser: 1.13 mg/dL (ref 0.40–1.50)
GFR: 65.29 mL/min (ref >60.00)
Glucose, Bld: 131 mg/dL — ABNORMAL HIGH (ref 70–99)
Potassium: 3.6 mEq/L (ref 3.5–5.1)
Sodium: 141 mEq/L (ref 135–145)
TOTAL PROTEIN: 6.2 g/dL (ref 6.0–8.3)

## 2016-04-02 LAB — CBC WITH DIFFERENTIAL/PLATELET
Basophils Absolute: 0 10*3/uL (ref 0.0–0.1)
Basophils Relative: 0.7 % (ref 0.0–3.0)
Eosinophils Absolute: 0.2 10*3/uL (ref 0.0–0.7)
Eosinophils Relative: 3.4 % (ref 0.0–5.0)
HEMATOCRIT: 39.4 % (ref 39.0–52.0)
Hemoglobin: 13.5 g/dL (ref 13.0–17.0)
LYMPHS PCT: 31.1 % (ref 12.0–46.0)
Lymphs Abs: 1.8 10*3/uL (ref 0.7–4.0)
MCHC: 34.2 g/dL (ref 30.0–36.0)
MCV: 88 fl (ref 78.0–100.0)
Monocytes Absolute: 0.7 10*3/uL (ref 0.1–1.0)
Monocytes Relative: 12.7 % — ABNORMAL HIGH (ref 3.0–12.0)
Neutro Abs: 2.9 10*3/uL (ref 1.4–7.7)
Neutrophils Relative %: 52.1 % (ref 43.0–77.0)
Platelets: 143 10*3/uL — ABNORMAL LOW (ref 150.0–400.0)
RBC: 4.48 Mil/uL (ref 4.22–5.81)
RDW: 14.4 % (ref 11.5–15.5)
WBC: 5.6 10*3/uL (ref 4.0–10.5)

## 2016-04-02 LAB — THYROID PANEL WITH TSH
FREE THYROXINE INDEX: 2.1 (ref 1.4–3.8)
T3 Uptake: 29 % (ref 22–35)
T4, Total: 7.3 ug/dL (ref 4.5–12.0)
TSH: 1.91 mIU/L (ref 0.40–4.50)

## 2016-04-02 LAB — URINALYSIS, ROUTINE W REFLEX MICROSCOPIC
BILIRUBIN URINE: NEGATIVE
KETONES UR: NEGATIVE
LEUKOCYTES UA: NEGATIVE
NITRITE: NEGATIVE
RBC / HPF: NONE SEEN (ref 0–?)
Specific Gravity, Urine: 1.015 (ref 1.000–1.030)
TOTAL PROTEIN, URINE-UPE24: NEGATIVE
Urine Glucose: NEGATIVE
Urobilinogen, UA: 0.2 (ref 0.0–1.0)
pH: 6 (ref 5.0–8.0)

## 2016-04-02 LAB — BRAIN NATRIURETIC PEPTIDE: Pro B Natriuretic peptide (BNP): 106 pg/mL — ABNORMAL HIGH (ref 0.0–100.0)

## 2016-04-02 LAB — D-DIMER, QUANTITATIVE: D-Dimer, Quant: 0.55 mcg/mL FEU — ABNORMAL HIGH (ref <0.50)

## 2016-04-02 MED ORDER — TORSEMIDE 10 MG PO TABS: 10.0000 mg | ORAL_TABLET | Freq: Every day | ORAL | 1 refills | Status: DC

## 2016-04-02 NOTE — Progress Notes (Signed)
Subjective:  Patient ID: Timothy Snyder, male    DOB: 1928-02-09  Age: 83 y.o. MRN: [id number]  CC: Leg Swelling   HPI Timothy Snyder presents for a 4 day hx of painless swelling in his lower extremities. He had a small MI about a year and a half ago and at that time had an echocardiogram that showed grade 2 diastolic dysfunction. He denies any recent episodes of chest pain, shortness of breath, dysuria, hematuria, abdominal pain, or cough.  Outpatient Medications Prior to Visit  Medication Sig Dispense Refill  . aspirin 81 MG tablet Take 81 mg by mouth daily.    . beta carotene w/minerals (OCUVITE) tablet Take 1 tablet by mouth at bedtime.     . carvedilol (COREG) 3.125 MG tablet Take 1 tablet (3.125 mg total) by mouth 2 (two) times daily with a meal. 180 tablet 3  . clonazePAM (KLONOPIN) 0.5 MG tablet Take 1 tablet (0.5 mg total) by mouth at bedtime. 30 tablet 5  . doxazosin (CARDURA) 4 MG tablet TAKE ONE TABLET BY MOUTH ONCE DAILY 90 tablet 3  . finasteride (PROSCAR) 5 MG tablet Take 1 tablet (5 mg total) by mouth daily. 90 tablet 3  . glimepiride (AMARYL) 2 MG tablet Take 1 tablet (2 mg total) by mouth daily with breakfast. 90 tablet 3  . glucose blood (PRODIGY NO CODING BLOOD GLUC) test strip 1 each by Other route daily. Use as instructed 100 each 1  . KRILL OIL PO Take 1 tablet by mouth daily.    Marland Kitchen lisinopril (PRINIVIL,ZESTRIL) 2.5 MG tablet Take 1 tablet (2.5 mg total) by mouth daily. 90 tablet 1  . Loratadine 10 MG CAPS Take 10 mg by mouth 2 (two) times daily.    . nitroGLYCERIN (NITROSTAT) 0.4 MG SL tablet Place 1 tablet (0.4 mg total) under the tongue every 5 (five) minutes x 3 doses as needed for chest pain. 25 tablet 3  . omeprazole (PRILOSEC) 20 MG capsule Take 1 capsule (20 mg total) by mouth daily. 90 capsule 1  . pravastatin (PRAVACHOL) 20 MG tablet Take 1 tablet (20 mg total) by mouth every evening. 90 tablet 3  . PRODIGY LANCETS 26G MISC 1 each by Does not apply route  daily. Dx code 250.00 100 each 1  . timolol (TIMOPTIC) 0.5 % ophthalmic solution Place 1 drop into both eyes 2 (two) times daily.  2  . timolol (TIMOPTIC-XR) 0.5 % ophthalmic gel-forming Place 1 drop into both eyes daily.      No facility-administered medications prior to visit.     ROS Review of Systems  Constitutional: Positive for unexpected weight change (wt gain). Negative for appetite change, chills, diaphoresis and fatigue.  HENT: Negative.   Eyes: Negative.   Respiratory: Negative for cough, chest tightness, shortness of breath and wheezing.   Cardiovascular: Positive for leg swelling. Negative for chest pain and palpitations.  Gastrointestinal: Negative.  Negative for abdominal pain, constipation, diarrhea, nausea and vomiting.  Endocrine: Negative.   Genitourinary: Negative.  Negative for difficulty urinating, dysuria, flank pain, frequency and urgency.  Musculoskeletal: Negative.  Negative for back pain, myalgias and neck pain.  Skin: Negative.  Negative for color change.  Allergic/Immunologic: Negative.   Neurological: Negative.  Negative for dizziness, weakness, light-headedness and headaches.  Hematological: Negative.  Negative for adenopathy. Does not bruise/bleed easily.  Psychiatric/Behavioral: Negative.     Objective:  BP 124/60 (BP Location: Left Arm, Patient Position: Sitting, Cuff Size: Normal)   Pulse 60  Temp 97.6 F (36.4 C) (Oral)   Resp 16   Ht 5\' 8"  (1.727 m)   Wt 206 lb 4 oz (93.6 kg)   SpO2 99%   BMI 31.36 kg/m   BP Readings from Last 3 Encounters:  04/02/16 124/60  11/09/15 119/61  10/26/15 (!) 112/58    Wt Readings from Last 3 Encounters:  04/02/16 206 lb 4 oz (93.6 kg)  11/09/15 192 lb 3.2 oz (87.2 kg)  10/26/15 195 lb 4 oz (88.6 kg)    Physical Exam  Constitutional: He is oriented to person, place, and time. No distress.  HENT:  Mouth/Throat: Oropharynx is clear and moist. No oropharyngeal exudate.  Eyes: Conjunctivae are normal.  Right eye exhibits no discharge. Left eye exhibits no discharge. No scleral icterus.  Neck: Normal range of motion. No JVD present. No tracheal deviation present. No thyromegaly present.  Cardiovascular: Normal rate, regular rhythm, normal heart sounds and intact distal pulses.  Exam reveals no gallop and no friction rub.   No murmur heard. EKG --  inus  Rhythm  -First degree A-V block  PRi = 240 Low voltage in limb leads.   -  Nonspecific T-abnormality.   ABNORMAL - no change compared to prior EKG  Pulmonary/Chest: Effort normal and breath sounds normal. No stridor. No respiratory distress. He has no wheezes. He has no rales. He exhibits no tenderness.  Abdominal: Soft. Bowel sounds are normal. He exhibits no distension. There is no tenderness. There is no rebound and no guarding.  Musculoskeletal: Normal range of motion. He exhibits edema (2+ pitting edema in BLE). He exhibits no tenderness or deformity.  Lymphadenopathy:    He has no cervical adenopathy.  Neurological: He is oriented to person, place, and time.  Skin: Skin is warm and dry. No rash noted. He is not diaphoretic. No erythema. No pallor.  Vitals reviewed.   Lab Results  Component Value Date   WBC 5.6 04/02/2016   HGB 13.5 04/02/2016   HCT 39.4 04/02/2016   PLT 143.0 (L) 04/02/2016   GLUCOSE 131 (H) 04/02/2016   CHOL 123 04/20/2015   TRIG 181.0 (H) 04/20/2015   HDL 27.70 (L) 04/20/2015   LDLDIRECT 93.1 09/29/2012   LDLCALC 59 04/20/2015   ALT 18 04/02/2016   AST 15 04/02/2016   NA 141 04/02/2016   K 3.6 04/02/2016   CL 107 04/02/2016   CREATININE 1.13 04/02/2016   BUN 15 04/02/2016   CO2 30 04/02/2016   TSH 1.91 04/02/2016   PSA 1.87 03/31/2012   INR 1.20 09/21/2014   HGBA1C 6.7 (H) 10/26/2015   MICROALBUR <0.7 04/20/2015    Dg Chest 2 View  Result Date: 09/20/2014 CLINICAL DATA:  Chest pain and shortness of breath for 4 days EXAM: CHEST  2 VIEW COMPARISON:  January 14, 2014 FINDINGS: There is a  calcified granuloma in the lateral left base. Interstitium is mildly prominent without edema or consolidation. Heart size and pulmonary vascularity are within normal limits. No adenopathy. There is mild degenerative change in the thoracic spine. IMPRESSION: Granuloma left base, calcified and stable. No edema or consolidation. Mild generalized interstitial prominence is a stable finding, probably reflecting chronic inflammatory type change. Electronically Signed   By: Lowella Grip III M.D.   On: 09/20/2014 15:12    Assessment & Plan:   Dexten was seen today for leg swelling.  Diagnoses and all orders for this visit:  Localized edema- His EKG is negative for ongoing ischemia or LVH. His labs are  negative for nephrotic syndrome, fluid overload, thyroid disease, renal disease, or anemia. His chest x-ray is negative for pulmonary edema. His edema is most consistent with diastolic dysfunction, deconditioning, dependency, and weight gain. Will improve his diuresis with torsemide. His blood pressures soft so will keep the torsemide at a low dose. -     EKG 12-Lead -     Comprehensive metabolic panel; Future -     CBC with Differential/Platelet; Future -     Urinalysis, Routine w reflex microscopic; Future -     Thyroid Panel With TSH; Future -     D-dimer, quantitative (not at [facility]); Future -     Brain natriuretic peptide; Future -     DG Chest 2 View; Future -     torsemide (DEMADEX) 10 MG tablet; Take 1 tablet (10 mg total) by mouth daily.  Essential hypertension- his blood pressure is well-controlled, lites and renal function are stable. -     Comprehensive metabolic panel; Future -     CBC with Differential/Platelet; Future -     DG Chest 2 View; Future -     torsemide (DEMADEX) 10 MG tablet; Take 1 tablet (10 mg total) by mouth daily.   I am having Mr. Chung start on torsemide. I am also having him maintain his timolol, beta carotene w/minerals, aspirin, glucose blood, PRODIGY LANCETS  26G, Loratadine, KRILL OIL PO, nitroGLYCERIN, timolol, pravastatin, glimepiride, finasteride, doxazosin, carvedilol, omeprazole, clonazePAM, and lisinopril.  Meds ordered this encounter  Medications  . torsemide (DEMADEX) 10 MG tablet    Sig: Take 1 tablet (10 mg total) by mouth daily.    Dispense:  90 tablet    Refill:  1     Follow-up: Return in about 1 week (around 04/09/2016).  Scarlette Calico, MD

## 2016-04-02 NOTE — Progress Notes (Signed)
Pre visit review using our clinic review tool, if applicable. No additional management support is needed unless otherwise documented below in the visit note. 

## 2016-04-02 NOTE — Patient Instructions (Signed)
Edema Edema is an abnormal buildup of fluids in your bodytissues. Edema is somewhatdependent on gravity to pull the fluid to the lowest place in your body. That makes the condition more common in the legs and thighs (lower extremities). Painless swelling of the feet and ankles is common and becomes more likely as you get older. It is also common in looser tissues, like around your eyes. When the affected area is squeezed, the fluid may move out of that spot and leave a dent for a few moments. This dent is called pitting. What are the causes? There are many possible causes of edema. Eating too much salt and being on your feet or sitting for a long time can cause edema in your legs and ankles. Hot weather may make edema worse. Common medical causes of edema include:  Heart failure.  Liver disease.  Kidney disease.  Weak blood vessels in your legs.  Cancer.  An injury.  Pregnancy.  Some medications.  Obesity.  What are the signs or symptoms? Edema is usually painless.Your skin may look swollen or shiny. How is this diagnosed? Your health care provider may be able to diagnose edema by asking about your medical history and doing a physical exam. You may need to have tests such as X-rays, an electrocardiogram, or blood tests to check for medical conditions that may cause edema. How is this treated? Edema treatment depends on the cause. If you have heart, liver, or kidney disease, you need the treatment appropriate for these conditions. General treatment may include:  Elevation of the affected body part above the level of your heart.  Compression of the affected body part. Pressure from elastic bandages or support stockings squeezes the tissues and forces fluid back into the blood vessels. This keeps fluid from entering the tissues.  Restriction of fluid and salt intake.  Use of a water pill (diuretic). These medications are appropriate only for some types of edema. They pull fluid  out of your body and make you urinate more often. This gets rid of fluid and reduces swelling, but diuretics can have side effects. Only use diuretics as directed by your health care provider.  Follow these instructions at home:  Keep the affected body part above the level of your heart when you are lying down.  Do not sit still or stand for prolonged periods.  Do not put anything directly under your knees when lying down.  Do not wear constricting clothing or garters on your upper legs.  Exercise your legs to work the fluid back into your blood vessels. This may help the swelling go down.  Wear elastic bandages or support stockings to reduce ankle swelling as directed by your health care provider.  Eat a low-salt diet to reduce fluid if your health care provider recommends it.  Only take medicines as directed by your health care provider. Contact a health care provider if:  Your edema is not responding to treatment.  You have heart, liver, or kidney disease and notice symptoms of edema.  You have edema in your legs that does not improve after elevating them.  You have sudden and unexplained weight gain. Get help right away if:  You develop shortness of breath or chest pain.  You cannot breathe when you lie down.  You develop pain, redness, or warmth in the swollen areas.  You have heart, liver, or kidney disease and suddenly get edema.  You have a fever and your symptoms suddenly get worse. This information is   not intended to replace advice given to you by your health care provider. Make sure you discuss any questions you have with your health care provider. Document Released: 02/04/2005 Document Revised: 07/13/2015 Document Reviewed: 11/27/2012 Elsevier Interactive Patient Education  2017 [employer]  

## 2016-04-03 ENCOUNTER — Encounter: Payer: Self-pay | Admitting: Internal Medicine

## 2016-04-09 ENCOUNTER — Ambulatory Visit (INDEPENDENT_AMBULATORY_CARE_PROVIDER_SITE_OTHER): Payer: PPO | Admitting: Internal Medicine

## 2016-04-09 ENCOUNTER — Other Ambulatory Visit (INDEPENDENT_AMBULATORY_CARE_PROVIDER_SITE_OTHER): Payer: PPO

## 2016-04-09 ENCOUNTER — Encounter: Payer: Self-pay | Admitting: Internal Medicine

## 2016-04-09 VITALS — BP 118/58 | HR 61 | Temp 97.5°F | Resp 16 | Ht 68.0 in | Wt 197.0 lb

## 2016-04-09 DIAGNOSIS — N400 Enlarged prostate without lower urinary tract symptoms: Secondary | ICD-10-CM

## 2016-04-09 DIAGNOSIS — E118 Type 2 diabetes mellitus with unspecified complications: Secondary | ICD-10-CM

## 2016-04-09 DIAGNOSIS — I10 Essential (primary) hypertension: Secondary | ICD-10-CM

## 2016-04-09 LAB — URINALYSIS, ROUTINE W REFLEX MICROSCOPIC
Bilirubin Urine: NEGATIVE
Ketones, ur: NEGATIVE
LEUKOCYTES UA: NEGATIVE
NITRITE: NEGATIVE
RBC / HPF: NONE SEEN (ref 0–?)
Specific Gravity, Urine: 1.01 (ref 1.000–1.030)
Total Protein, Urine: NEGATIVE
URINE GLUCOSE: NEGATIVE
Urobilinogen, UA: 0.2 (ref 0.0–1.0)
WBC, UA: NONE SEEN (ref 0–?)
pH: 5.5 (ref 5.0–8.0)

## 2016-04-09 LAB — BASIC METABOLIC PANEL
BUN: 20 mg/dL (ref 6–23)
CHLORIDE: 102 meq/L (ref 96–112)
CO2: 32 meq/L (ref 19–32)
Calcium: 9.2 mg/dL (ref 8.4–10.5)
Creatinine, Ser: 1.38 mg/dL (ref 0.40–1.50)
GFR: 51.84 mL/min — ABNORMAL LOW (ref >60.00)
GLUCOSE: 132 mg/dL — AB (ref 70–99)
POTASSIUM: 4 meq/L (ref 3.5–5.1)
Sodium: 140 mEq/L (ref 135–145)

## 2016-04-09 LAB — MICROALBUMIN / CREATININE URINE RATIO
Creatinine,U: 105.9 mg/dL
Microalb Creat Ratio: 0.7 mg/g (ref 0.0–30.0)

## 2016-04-09 LAB — HEMOGLOBIN A1C: Hgb A1c MFr Bld: 6.7 % — ABNORMAL HIGH (ref 4.6–6.5)

## 2016-04-09 MED ORDER — ALFUZOSIN HCL ER 10 MG PO TB24: 10.0000 mg | ORAL_TABLET | Freq: Every day | ORAL | 3 refills | Status: DC

## 2016-04-09 NOTE — Patient Instructions (Signed)

## 2016-04-09 NOTE — Progress Notes (Signed)
Subjective:  Patient ID: Timothy Snyder, male    DOB: 1927/02/25  Age: 83 y.o. MRN: [id number]  CC: Hypertension   HPI RUTILIO GRILLI presents for Follow-up after being seen a week ago for lower extremity edema. He is started taking the loop diuretic and tells me the edema has resolved. He has also lost weight since I last saw him. His only complaint now is of a weak urinary stream.  Outpatient Medications Prior to Visit  Medication Sig Dispense Refill  . aspirin 81 MG tablet Take 81 mg by mouth daily.    . beta carotene w/minerals (OCUVITE) tablet Take 1 tablet by mouth at bedtime.     . clonazePAM (KLONOPIN) 0.5 MG tablet Take 1 tablet (0.5 mg total) by mouth at bedtime. 30 tablet 5  . finasteride (PROSCAR) 5 MG tablet Take 1 tablet (5 mg total) by mouth daily. 90 tablet 3  . glimepiride (AMARYL) 2 MG tablet Take 1 tablet (2 mg total) by mouth daily with breakfast. 90 tablet 3  . glucose blood (PRODIGY NO CODING BLOOD GLUC) test strip 1 each by Other route daily. Use as instructed 100 each 1  . KRILL OIL PO Take 1 tablet by mouth daily.    Marland Kitchen lisinopril (PRINIVIL,ZESTRIL) 2.5 MG tablet Take 1 tablet (2.5 mg total) by mouth daily. 90 tablet 1  . Loratadine 10 MG CAPS Take 10 mg by mouth 2 (two) times daily.    . nitroGLYCERIN (NITROSTAT) 0.4 MG SL tablet Place 1 tablet (0.4 mg total) under the tongue every 5 (five) minutes x 3 doses as needed for chest pain. 25 tablet 3  . omeprazole (PRILOSEC) 20 MG capsule Take 1 capsule (20 mg total) by mouth daily. 90 capsule 1  . PRODIGY LANCETS 26G MISC 1 each by Does not apply route daily. Dx code 250.00 100 each 1  . timolol (TIMOPTIC) 0.5 % ophthalmic solution Place 1 drop into both eyes 2 (two) times daily.  2  . timolol (TIMOPTIC-XR) 0.5 % ophthalmic gel-forming Place 1 drop into both eyes daily.     Marland Kitchen torsemide (DEMADEX) 10 MG tablet Take 1 tablet (10 mg total) by mouth daily. 90 tablet 1  . carvedilol (COREG) 3.125 MG tablet Take 1 tablet  (3.125 mg total) by mouth 2 (two) times daily with a meal. 180 tablet 3  . doxazosin (CARDURA) 4 MG tablet TAKE ONE TABLET BY MOUTH ONCE DAILY 90 tablet 3  . pravastatin (PRAVACHOL) 20 MG tablet Take 1 tablet (20 mg total) by mouth every evening. 90 tablet 3   No facility-administered medications prior to visit.     ROS Review of Systems  Constitutional: Negative for appetite change, diaphoresis, fatigue and unexpected weight change.  HENT: Negative.   Eyes: Negative.   Respiratory: Negative for chest tightness, shortness of breath and wheezing.   Cardiovascular: Negative for chest pain, palpitations and leg swelling.  Gastrointestinal: Negative for abdominal pain, constipation, diarrhea, nausea and vomiting.  Endocrine: Negative.   Genitourinary: Positive for difficulty urinating. Negative for dysuria, frequency, hematuria and urgency.  Musculoskeletal: Negative for back pain and neck pain.  Skin: Negative.   Allergic/Immunologic: Negative.   Neurological: Negative.  Negative for dizziness, syncope, weakness, light-headedness and headaches.  Hematological: Negative for adenopathy. Does not bruise/bleed easily.  Psychiatric/Behavioral: Negative.     Objective:  BP (!) 118/58 (BP Location: Left Arm, Patient Position: Sitting, Cuff Size: Normal)   Pulse 61   Temp 97.5 F (36.4 C) (Oral)  Resp 16   Ht 5\' 8"  (1.727 m)   Wt 197 lb (89.4 kg)   SpO2 96%   BMI 29.95 kg/m   BP Readings from Last 3 Encounters:  04/09/16 (!) 118/58  04/02/16 124/60  11/09/15 119/61    Wt Readings from Last 3 Encounters:  04/09/16 197 lb (89.4 kg)  04/02/16 206 lb 4 oz (93.6 kg)  11/09/15 192 lb 3.2 oz (87.2 kg)    Physical Exam  Constitutional: He is oriented to person, place, and time. No distress.  HENT:  Mouth/Throat: Oropharynx is clear and moist. No oropharyngeal exudate.  Eyes: Conjunctivae are normal. Right eye exhibits no discharge. Left eye exhibits no discharge. No scleral  icterus.  Neck: Normal range of motion. Neck supple. No JVD present. No tracheal deviation present. No thyromegaly present.  Cardiovascular: Normal rate, regular rhythm, normal heart sounds and intact distal pulses.  Exam reveals no gallop and no friction rub.   No murmur heard. Pulmonary/Chest: Effort normal and breath sounds normal. No stridor. No respiratory distress. He has no wheezes. He has no rales. He exhibits no tenderness.  Abdominal: Soft. Bowel sounds are normal. He exhibits no distension and no mass. There is no tenderness. There is no rebound and no guarding.  Musculoskeletal: Normal range of motion. He exhibits no edema, tenderness or deformity.  Lymphadenopathy:    He has no cervical adenopathy.  Neurological: He is oriented to person, place, and time.  Skin: Skin is warm and dry. No rash noted. He is not diaphoretic. No erythema. No pallor.  Vitals reviewed.   Lab Results  Component Value Date   WBC 5.6 04/02/2016   HGB 13.5 04/02/2016   HCT 39.4 04/02/2016   PLT 143.0 (L) 04/02/2016   GLUCOSE 132 (H) 04/09/2016   CHOL 123 04/20/2015   TRIG 181.0 (H) 04/20/2015   HDL 27.70 (L) 04/20/2015   LDLDIRECT 93.1 09/29/2012   LDLCALC 59 04/20/2015   ALT 18 04/02/2016   AST 15 04/02/2016   NA 140 04/09/2016   K 4.0 04/09/2016   CL 102 04/09/2016   CREATININE 1.38 04/09/2016   BUN 20 04/09/2016   CO2 32 04/09/2016   TSH 1.91 04/02/2016   PSA 1.87 03/31/2012   INR 1.20 09/21/2014   HGBA1C 6.7 (H) 04/09/2016   MICROALBUR <0.7 04/09/2016    Dg Chest 2 View  Result Date: 04/02/2016 CLINICAL DATA:  Bilateral leg swelling EXAM: CHEST  2 VIEW COMPARISON:  09/20/2014 FINDINGS: Cardiac shadow is stable. Aortic calcifications are again seen. The lungs are well aerated bilaterally. No focal infiltrate or sizable effusion is seen. Degenerative changes of the thoracic spine are noted. IMPRESSION: No active cardiopulmonary disease. Electronically Signed   By: Inez Catalina M.D.    On: 04/02/2016 16:05    Assessment & Plan:   Taveion was seen today for hypertension.  Diagnoses and all orders for this visit:  Essential hypertension- her recent evaluation for the edema was unremarkable. There is no evidence of CHF, metabolic causes, renal causes, or potein deficiency. He has responded well to the loop diuretic. Will continue it at the current dose. His electrolytes and renal function today are normal. -     Basic metabolic panel; Future -     Hemoglobin A1c; Future -     Urinalysis, Routine w reflex microscopic; Future  Controlled type 2 diabetes mellitus with complication, without long-term current use of insulin ([place])- his blood sugars are adequately well controlled -     Hemoglobin  A1c; Future -     Microalbumin / creatinine urine ratio; Future  BPH without obstruction/lower urinary tract symptoms- will try a more potent alpha-blocker -     alfuzosin (UROXATRAL) 10 MG 24 hr tablet; Take 1 tablet (10 mg total) by mouth daily with breakfast.   I have discontinued Mr. Bensman doxazosin. I am also having him start on alfuzosin. Additionally, I am having him maintain his timolol, beta carotene w/minerals, aspirin, glucose blood, PRODIGY LANCETS 26G, Loratadine, KRILL OIL PO, nitroGLYCERIN, timolol, glimepiride, finasteride, omeprazole, clonazePAM, lisinopril, and torsemide.  Meds ordered this encounter  Medications  . alfuzosin (UROXATRAL) 10 MG 24 hr tablet    Sig: Take 1 tablet (10 mg total) by mouth daily with breakfast.    Dispense:  90 tablet    Refill:  3     Follow-up: Return in about 4 months (around 08/07/2016).  Scarlette Calico, MD

## 2016-04-09 NOTE — Progress Notes (Signed)
Pre visit review using our clinic review tool, if applicable. No additional management support is needed unless otherwise documented below in the visit note. 

## 2016-04-10 ENCOUNTER — Other Ambulatory Visit: Payer: Self-pay | Admitting: Internal Medicine

## 2016-04-10 DIAGNOSIS — E118 Type 2 diabetes mellitus with unspecified complications: Secondary | ICD-10-CM

## 2016-04-10 DIAGNOSIS — I2510 Atherosclerotic heart disease of native coronary artery without angina pectoris: Secondary | ICD-10-CM

## 2016-04-10 DIAGNOSIS — I10 Essential (primary) hypertension: Secondary | ICD-10-CM

## 2016-04-10 DIAGNOSIS — E785 Hyperlipidemia, unspecified: Secondary | ICD-10-CM

## 2016-04-10 DIAGNOSIS — I2583 Coronary atherosclerosis due to lipid rich plaque: Principal | ICD-10-CM

## 2016-04-10 DIAGNOSIS — I214 Non-ST elevation (NSTEMI) myocardial infarction: Secondary | ICD-10-CM

## 2016-04-10 MED ORDER — CARVEDILOL 3.125 MG PO TABS: 3.1250 mg | ORAL_TABLET | Freq: Two times a day (BID) | ORAL | 3 refills | Status: DC

## 2016-04-10 MED ORDER — PRAVASTATIN SODIUM 20 MG PO TABS: 20.0000 mg | ORAL_TABLET | Freq: Every evening | ORAL | 3 refills | Status: DC

## 2016-04-11 ENCOUNTER — Encounter: Payer: Self-pay | Admitting: Internal Medicine

## 2016-04-20 ENCOUNTER — Other Ambulatory Visit: Payer: Self-pay | Admitting: Family

## 2016-04-22 ENCOUNTER — Ambulatory Visit: Payer: PPO | Admitting: Internal Medicine

## 2016-04-24 ENCOUNTER — Ambulatory Visit: Payer: PPO | Admitting: Internal Medicine

## 2016-05-01 ENCOUNTER — Encounter: Payer: Self-pay | Admitting: Internal Medicine

## 2016-05-20 ENCOUNTER — Encounter: Payer: Self-pay | Admitting: Internal Medicine

## 2016-05-23 ENCOUNTER — Encounter: Payer: Self-pay | Admitting: Internal Medicine

## 2016-05-24 MED ORDER — FINASTERIDE 5 MG PO TABS: 5.0000 mg | ORAL_TABLET | Freq: Every day | ORAL | 3 refills | Status: DC

## 2016-07-01 ENCOUNTER — Encounter: Payer: Self-pay | Admitting: Internal Medicine

## 2016-07-01 NOTE — Telephone Encounter (Signed)
The dose can not be increased.  A different medication may be needed.  This is not urgent and can wait for Dr Ronnald Ramp.  Let patient know he can get back to him when he returns "

## 2016-07-02 NOTE — Telephone Encounter (Signed)
Please advise at your convenience 

## 2016-07-06 ENCOUNTER — Other Ambulatory Visit: Payer: Self-pay | Admitting: Internal Medicine

## 2016-07-06 DIAGNOSIS — N400 Enlarged prostate without lower urinary tract symptoms: Secondary | ICD-10-CM

## 2016-07-12 DIAGNOSIS — H401133 Primary open-angle glaucoma, bilateral, severe stage: Secondary | ICD-10-CM | POA: Diagnosis not present

## 2016-08-07 ENCOUNTER — Ambulatory Visit (INDEPENDENT_AMBULATORY_CARE_PROVIDER_SITE_OTHER): Payer: PPO | Admitting: Internal Medicine

## 2016-08-07 ENCOUNTER — Other Ambulatory Visit (INDEPENDENT_AMBULATORY_CARE_PROVIDER_SITE_OTHER): Payer: PPO

## 2016-08-07 ENCOUNTER — Encounter: Payer: Self-pay | Admitting: Internal Medicine

## 2016-08-07 VITALS — BP 120/60 | HR 62 | Temp 97.5°F | Resp 16 | Ht 68.0 in | Wt 195.0 lb

## 2016-08-07 DIAGNOSIS — Z0000 Encounter for general adult medical examination without abnormal findings: Secondary | ICD-10-CM

## 2016-08-07 DIAGNOSIS — I10 Essential (primary) hypertension: Secondary | ICD-10-CM | POA: Diagnosis not present

## 2016-08-07 DIAGNOSIS — E785 Hyperlipidemia, unspecified: Secondary | ICD-10-CM | POA: Diagnosis not present

## 2016-08-07 DIAGNOSIS — E118 Type 2 diabetes mellitus with unspecified complications: Secondary | ICD-10-CM

## 2016-08-07 DIAGNOSIS — I2510 Atherosclerotic heart disease of native coronary artery without angina pectoris: Secondary | ICD-10-CM | POA: Diagnosis not present

## 2016-08-07 LAB — HEMOGLOBIN A1C: Hgb A1c MFr Bld: 7.9 % — ABNORMAL HIGH (ref 4.6–6.5)

## 2016-08-07 LAB — LIPID PANEL
CHOL/HDL RATIO: 5
Cholesterol: 115 mg/dL (ref 0–200)
HDL: 24.2 mg/dL — AB (ref >39.00)
LDL Cholesterol: 57 mg/dL (ref 0–99)
NONHDL: 90.93
TRIGLYCERIDES: 170 mg/dL — AB (ref 0.0–149.0)
VLDL: 34 mg/dL (ref 0.0–40.0)

## 2016-08-07 LAB — BASIC METABOLIC PANEL
BUN: 18 mg/dL (ref 6–23)
CALCIUM: 9.2 mg/dL (ref 8.4–10.5)
CO2: 29 meq/L (ref 19–32)
CREATININE: 1.45 mg/dL (ref 0.40–1.50)
Chloride: 100 mEq/L (ref 96–112)
GFR: 48.92 mL/min — AB (ref >60.00)
GLUCOSE: 211 mg/dL — AB (ref 70–99)
Potassium: 4.2 mEq/L (ref 3.5–5.1)
SODIUM: 136 meq/L (ref 135–145)

## 2016-08-07 NOTE — Patient Instructions (Signed)

## 2016-08-07 NOTE — Progress Notes (Signed)
Subjective:  Patient ID: Timothy Snyder, male    DOB: 06/23/1927  Age: 83 y.o. MRN: [id number]  CC: Hypertension; Hyperlipidemia; Diabetes; and Annual Exam   HPI [name] presents for an AWV, f/up - He complains of persistent trouble with weak urine stream and hesitation but is decided not to see a urologist. He has chronic unchanged edema in his lower extremity and wants a refill on furosemide. He's had no recent episodes of chest pain, shortness of breath, polydipsia, polyuria, or polyphagia. He does not adhere to dietary modifications.  Past Medical History:  Diagnosis Date  . Actinic keratosis   . ARTHRITIS   . BPH (benign prostatic hyperplasia)   . CAD (coronary artery disease)    cath 09/21/2014 1v dx, 99% mid LAD treated with DES, 50% prox to mid LAD.  Marland Kitchen DIABETES MELLITUS, TYPE II   . GERD (gastroesophageal reflux disease)   . GLUCOMA   . HYPERTENSION   . Macular degeneration    L>R vision loss, legally blind  . Restless leg syndrome    Past Surgical History:  Procedure Laterality Date  . CARDIAC CATHETERIZATION N/A 09/21/2014   Procedure: Left Heart Cath and Coronary Angiography;  Surgeon: Peter M Martinique, MD;  Location: [place] [facility];  Service: Cardiovascular;  Laterality: N/A;  . CARDIAC CATHETERIZATION  09/21/2014   Procedure: Coronary Stent Intervention;  Surgeon: Peter M Martinique, MD;  Location: [place] [facility];  Service: Cardiovascular;;  . NO PAST SURGERIES      reports that he has never smoked. He has never used smokeless tobacco. He reports that he does not drink alcohol or use drugs. family history includes Cancer in his father; Coronary artery disease (age of onset: 39) in his mother; Heart failure in his mother. Allergies  Allergen Reactions  . Brinzolamide-Brimonidine Other (See Comments)  . Other Other (See Comments)    Several other eye medications - intollerant    Outpatient Medications Prior to Visit  Medication Sig Dispense Refill  .  alfuzosin (UROXATRAL) 10 MG 24 hr tablet Take 1 tablet (10 mg total) by mouth daily with breakfast. 90 tablet 3  . aspirin 81 MG tablet Take 81 mg by mouth daily.    . beta carotene w/minerals (OCUVITE) tablet Take 1 tablet by mouth at bedtime.     . carvedilol (COREG) 3.125 MG tablet Take 1 tablet (3.125 mg total) by mouth 2 (two) times daily with a meal. 180 tablet 3  . clonazePAM (KLONOPIN) 0.5 MG tablet Take 1 tablet (0.5 mg total) by mouth at bedtime. 30 tablet 5  . finasteride (PROSCAR) 5 MG tablet Take 1 tablet (5 mg total) by mouth daily. 90 tablet 3  . glimepiride (AMARYL) 2 MG tablet TAKE 1 TABLET(2 MG) BY MOUTH DAILY WITH BREAKFAST 90 tablet 2  . glucose blood (PRODIGY NO CODING BLOOD GLUC) test strip 1 each by Other route daily. Use as instructed 100 each 1  . KRILL OIL PO Take 1 tablet by mouth daily.    Marland Kitchen lisinopril (PRINIVIL,ZESTRIL) 2.5 MG tablet Take 1 tablet (2.5 mg total) by mouth daily. 90 tablet 1  . Loratadine 10 MG CAPS Take 10 mg by mouth 2 (two) times daily.    Marland Kitchen omeprazole (PRILOSEC) 20 MG capsule Take 1 capsule (20 mg total) by mouth daily. 90 capsule 1  . pravastatin (PRAVACHOL) 20 MG tablet Take 1 tablet (20 mg total) by mouth every evening. 90 tablet 3  . PRODIGY LANCETS 26G MISC  1 each by Does not apply route daily. Dx code 250.00 100 each 1  . timolol (TIMOPTIC) 0.5 % ophthalmic solution Place 1 drop into both eyes 2 (two) times daily.  2  . torsemide (DEMADEX) 10 MG tablet Take 1 tablet (10 mg total) by mouth daily. 90 tablet 1  . nitroGLYCERIN (NITROSTAT) 0.4 MG SL tablet Place 1 tablet (0.4 mg total) under the tongue every 5 (five) minutes x 3 doses as needed for chest pain. (Patient not taking: Reported on 08/07/2016) 25 tablet 3   No facility-administered medications prior to visit.     ROS Review of Systems  Constitutional: Negative.  Negative for appetite change, chills, diaphoresis, fatigue and unexpected weight change.  HENT: Negative.  Negative for  trouble swallowing.   Respiratory: Negative.  Negative for cough, chest tightness, shortness of breath and wheezing.   Cardiovascular: Positive for leg swelling. Negative for chest pain and palpitations.  Gastrointestinal: Negative for abdominal pain, constipation, diarrhea, nausea and vomiting.  Endocrine: Negative for polydipsia, polyphagia and polyuria.  Genitourinary: Positive for difficulty urinating. Negative for decreased urine volume, dysuria, frequency and urgency.  Musculoskeletal: Negative for arthralgias, back pain, myalgias and neck pain.  Skin: Negative.  Negative for color change and rash.  Neurological: Negative.  Negative for dizziness, weakness and light-headedness.  Hematological: Negative for adenopathy. Does not bruise/bleed easily.  Psychiatric/Behavioral: Negative.     Objective:  BP 120/60 (BP Location: Left Arm, Patient Position: Sitting, Cuff Size: Normal)   Pulse 62   Temp 97.5 F (36.4 C) (Oral)   Resp 16   Ht 5\' 8"  (1.727 m)   Wt 195 lb (88.5 kg)   SpO2 99%   BMI 29.65 kg/m   BP Readings from Last 3 Encounters:  08/07/16 120/60  04/09/16 (!) 118/58  04/02/16 124/60    Wt Readings from Last 3 Encounters:  08/07/16 195 lb (88.5 kg)  04/09/16 197 lb (89.4 kg)  04/02/16 206 lb 4 oz (93.6 kg)    Physical Exam  Constitutional: He is oriented to person, place, and time. No distress.  HENT:  Mouth/Throat: Oropharynx is clear and moist. No oropharyngeal exudate.  Eyes: Conjunctivae are normal. Right eye exhibits no discharge. Left eye exhibits no discharge. No scleral icterus.  Neck: Normal range of motion. Neck supple. No JVD present. No thyromegaly present.  Cardiovascular: Normal rate, regular rhythm and intact distal pulses.  Exam reveals no gallop.   No murmur heard. Pulmonary/Chest: Effort normal and breath sounds normal. No respiratory distress. He has no wheezes. He has no rales. He exhibits no tenderness.  Abdominal: Soft. Bowel sounds are  normal. He exhibits no distension and no mass. There is no tenderness. There is no rebound and no guarding.  Musculoskeletal: Normal range of motion. He exhibits edema (1+ pitting edema in BLE). He exhibits no tenderness or deformity.  Lymphadenopathy:    He has no cervical adenopathy.  Neurological: He is alert and oriented to person, place, and time.  Skin: Skin is warm and dry. No rash noted. He is not diaphoretic. No erythema. No pallor.  Vitals reviewed.   Lab Results  Component Value Date   WBC 5.6 04/02/2016   HGB 13.5 04/02/2016   HCT 39.4 04/02/2016   PLT 143.0 (L) 04/02/2016   GLUCOSE 211 (H) 08/07/2016   CHOL 115 08/07/2016   TRIG 170.0 (H) 08/07/2016   HDL 24.20 (L) 08/07/2016   LDLDIRECT 93.1 09/29/2012   LDLCALC 57 08/07/2016   ALT 18 04/02/2016  AST 15 04/02/2016   NA 136 08/07/2016   K 4.2 08/07/2016   CL 100 08/07/2016   CREATININE 1.45 08/07/2016   BUN 18 08/07/2016   CO2 29 08/07/2016   TSH 1.91 04/02/2016   PSA 1.87 03/31/2012   INR 1.20 09/21/2014   HGBA1C 7.9 (H) 08/07/2016   MICROALBUR <0.7 04/09/2016    Dg Chest 2 View  Result Date: 04/02/2016 CLINICAL DATA:  Bilateral leg swelling EXAM: CHEST  2 VIEW COMPARISON:  09/20/2014 FINDINGS: Cardiac shadow is stable. Aortic calcifications are again seen. The lungs are well aerated bilaterally. No focal infiltrate or sizable effusion is seen. Degenerative changes of the thoracic spine are noted. IMPRESSION: No active cardiopulmonary disease. Electronically Signed   By: Inez Catalina M.D.   On: 04/02/2016 16:05    Assessment & Plan:   Chesley was seen today for hypertension, hyperlipidemia, diabetes and annual exam.  Diagnoses and all orders for this visit:  Coronary artery disease involving native coronary artery of native heart without angina pectoris- he has had no recent episodes of angina, will continue risk factor modifications. -     Lipid panel; Future  Essential hypertension- his blood  pressure is well-controlled, electrolytes and renal function are normal. -     Basic metabolic panel; Future  Controlled type 2 diabetes mellitus with complication, without long-term current use of insulin ([place])- his A1c is up to 7.9%, he is not interested in adding another medication at this time but does agree to work on his lifestyle modifications. -     Basic metabolic panel; Future -     Hemoglobin A1c; Future  Hyperlipidemia with target LDL less than 70- he has achieved his LDL goal is doing well on the statin. -     Lipid panel; Future  Medicare annual wellness visit, subsequent   I am having Mr. Brahm maintain his beta carotene w/minerals, aspirin, glucose blood, PRODIGY LANCETS 26G, Loratadine, KRILL OIL PO, nitroGLYCERIN, timolol, omeprazole, clonazePAM, lisinopril, torsemide, alfuzosin, carvedilol, pravastatin, glimepiride, and finasteride.  No orders of the defined types were placed in this encounter.  See AVS for instructions about healthy living and anticipatory guidance.   Follow-up: Return in about 6 months (around 02/06/2017).  Scarlette Calico, MD

## 2016-08-12 NOTE — Assessment & Plan Note (Signed)

## 2016-09-08 ENCOUNTER — Other Ambulatory Visit: Payer: Self-pay | Admitting: Internal Medicine

## 2016-09-19 ENCOUNTER — Other Ambulatory Visit: Payer: Self-pay | Admitting: Internal Medicine

## 2016-09-19 DIAGNOSIS — Z794 Long term (current) use of insulin: Secondary | ICD-10-CM

## 2016-09-19 DIAGNOSIS — I2510 Atherosclerotic heart disease of native coronary artery without angina pectoris: Secondary | ICD-10-CM

## 2016-09-19 DIAGNOSIS — I10 Essential (primary) hypertension: Secondary | ICD-10-CM

## 2016-09-19 DIAGNOSIS — R600 Localized edema: Secondary | ICD-10-CM

## 2016-09-19 DIAGNOSIS — E118 Type 2 diabetes mellitus with unspecified complications: Secondary | ICD-10-CM

## 2016-09-26 ENCOUNTER — Encounter: Payer: Self-pay | Admitting: Cardiology

## 2016-09-26 ENCOUNTER — Encounter: Payer: Self-pay | Admitting: Internal Medicine

## 2016-09-26 ENCOUNTER — Other Ambulatory Visit: Payer: Self-pay | Admitting: Internal Medicine

## 2016-09-26 ENCOUNTER — Other Ambulatory Visit: Payer: Self-pay

## 2016-09-26 DIAGNOSIS — E118 Type 2 diabetes mellitus with unspecified complications: Secondary | ICD-10-CM

## 2016-09-26 MED ORDER — NITROGLYCERIN 0.4 MG SL SUBL: 0.4000 mg | SUBLINGUAL_TABLET | SUBLINGUAL | 11 refills | Status: DC | PRN

## 2016-09-26 MED ORDER — METFORMIN HCL ER 750 MG PO TB24: 750.0000 mg | ORAL_TABLET | Freq: Every day | ORAL | 1 refills | Status: DC

## 2016-09-30 ENCOUNTER — Encounter: Payer: Self-pay | Admitting: Internal Medicine

## 2016-09-30 ENCOUNTER — Other Ambulatory Visit: Payer: Self-pay | Admitting: Internal Medicine

## 2016-11-07 DIAGNOSIS — L570 Actinic keratosis: Secondary | ICD-10-CM | POA: Diagnosis not present

## 2016-11-07 DIAGNOSIS — Z23 Encounter for immunization: Secondary | ICD-10-CM | POA: Diagnosis not present

## 2016-11-07 DIAGNOSIS — Z85828 Personal history of other malignant neoplasm of skin: Secondary | ICD-10-CM | POA: Diagnosis not present

## 2016-11-07 DIAGNOSIS — B353 Tinea pedis: Secondary | ICD-10-CM | POA: Diagnosis not present

## 2016-11-07 DIAGNOSIS — L821 Other seborrheic keratosis: Secondary | ICD-10-CM | POA: Diagnosis not present

## 2016-11-07 DIAGNOSIS — D2272 Melanocytic nevi of left lower limb, including hip: Secondary | ICD-10-CM | POA: Diagnosis not present

## 2016-11-13 NOTE — Progress Notes (Signed)
Cardiology Office Note   Date:  11/18/2016   ID:  Timothy Snyder, DOB Mar 03, 1927, MRN [id number]  PCP:  Janith Lima, MD  Cardiologist:  Dr. Sherrice Creekmore Martinique     Chief Complaint  Patient presents with  . Follow-up    no chest pain, occassional gas pain  . Coronary Artery Disease     History of Present Illness: Timothy Snyder is a 83 y.o. male is seen for yearly follow up of CAD. He has a hx of diabetes, HTN, HL, CKD stage III, macular degeneration (blind).  Admitted 09/20/14 with a non-STEMI. LHC demonstrated single-vessel disease with 99% mid LAD lesion treated with a Synergy DES. Echocardiogram demonstrated normal LV function.    Here with his son.  He denies  syncope, dizziness, shortness of breath, or orthopnea. He has rare twinges of chest pain. Some stomach gas. Had some LE edema controlled with demadex daily.   He is active- rides an exercise bike for 30 minutes a day.     Past Medical History:  Diagnosis Date  . Actinic keratosis   . ARTHRITIS   . BPH (benign prostatic hyperplasia)   . CAD (coronary artery disease)    cath 09/21/2014 1v dx, 99% mid LAD treated with DES, 50% prox to mid LAD.  Marland Kitchen DIABETES MELLITUS, TYPE II   . GERD (gastroesophageal reflux disease)   . GLUCOMA   . HYPERTENSION   . Macular degeneration    L>R vision loss, legally blind  . Restless leg syndrome     Past Surgical History:  Procedure Laterality Date  . CARDIAC CATHETERIZATION N/A 09/21/2014   Procedure: Left Heart Cath and Coronary Angiography;  Surgeon: Milanya Sunderland M Martinique, MD;  Location: [place];  Service: Cardiovascular;  Laterality: N/A;  . CARDIAC CATHETERIZATION  09/21/2014   Procedure: Coronary Stent Intervention;  Surgeon: Dondi Burandt M Martinique, MD;  Location: [place] [facility];  Service: Cardiovascular;;  . NO PAST SURGERIES       Current Outpatient Prescriptions  Medication Sig Dispense Refill  . alfuzosin (UROXATRAL) 10 MG 24 hr tablet Take 1 tablet (10 mg total) by  mouth daily with breakfast. 90 tablet 3  . aspirin 81 MG tablet Take 81 mg by mouth daily.    . beta carotene w/minerals (OCUVITE) tablet Take 1 tablet by mouth at bedtime.     . carvedilol (COREG) 3.125 MG tablet Take 1 tablet (3.125 mg total) by mouth 2 (two) times daily with a meal. 180 tablet 3  . clonazePAM (KLONOPIN) 0.5 MG tablet TAKE 1 TABLET BY MOUTH EVERY NIGHT AT BEDTIME 30 tablet 5  . finasteride (PROSCAR) 5 MG tablet Take 1 tablet (5 mg total) by mouth daily. 90 tablet 3  . glimepiride (AMARYL) 2 MG tablet TAKE 1 TABLET(2 MG) BY MOUTH DAILY WITH BREAKFAST 90 tablet 2  . glucose blood (PRODIGY NO CODING BLOOD GLUC) test strip 1 each by Other route daily. Use as instructed 100 each 1  . KRILL OIL PO Take 1 tablet by mouth daily.    Marland Kitchen lisinopril (PRINIVIL,ZESTRIL) 2.5 MG tablet TAKE 1 TABLET(2.5 MG) BY MOUTH DAILY 90 tablet 1  . Loratadine 10 MG CAPS Take 10 mg by mouth 2 (two) times daily.    . metFORMIN (GLUCOPHAGE-XR) 750 MG 24 hr tablet Take 1 tablet (750 mg total) by mouth daily with breakfast. 90 tablet 1  . nitroGLYCERIN (NITROSTAT) 0.4 MG SL tablet Place 1 tablet (0.4 mg total) under the tongue every  5 (five) minutes x 3 doses as needed for chest pain. 25 tablet 11  . omeprazole (PRILOSEC) 20 MG capsule TAKE 1 CAPSULE(20 MG) BY MOUTH DAILY 90 capsule 1  . pravastatin (PRAVACHOL) 20 MG tablet Take 1 tablet (20 mg total) by mouth every evening. 90 tablet 3  . PRODIGY LANCETS 26G MISC 1 each by Does not apply route daily. Dx code 250.00 100 each 1  . timolol (TIMOPTIC) 0.5 % ophthalmic solution Place 1 drop into both eyes 2 (two) times daily.  2  . torsemide (DEMADEX) 10 MG tablet TAKE 1 TABLET(10 MG) BY MOUTH DAILY 90 tablet 1   No current facility-administered medications for this visit.     Allergies:   Brinzolamide-brimonidine and Other    Social History:  The patient  reports that he has never smoked. He has never used smokeless tobacco. He reports that he does not drink  alcohol or use drugs.   Family History:  The patient's family history includes Cancer in his father; Coronary artery disease (age of onset: 47) in his mother; Heart failure in his mother.    ROS:   Please see the history of present illness.   Review of Systems  All other systems reviewed and are negative.     PHYSICAL EXAM: VS:  BP 140/60   Pulse 64   Ht 5\' 8"  (1.727 m)   Wt 190 lb (86.2 kg)   BMI 28.89 kg/m     Wt Readings from Last 3 Encounters:  11/18/16 190 lb (86.2 kg)  08/07/16 195 lb (88.5 kg)  04/09/16 197 lb (89.4 kg)     GENERAL:  Well appearing elderly WM in NAD HEENT:  PERRL, EOMI, sclera are clear. Oropharynx is clear. NECK:  No jugular venous distention, carotid upstroke brisk and symmetric, no bruits, no thyromegaly or adenopathy LUNGS:  Clear to auscultation bilaterally CHEST:  Unremarkable HEART:  RRR,  PMI not displaced or sustained,S1 and S2 within normal limits, no S3, no S4: no clicks, no rubs, no murmurs ABD:  Soft, nontender. BS +, no masses or bruits. No hepatomegaly, no splenomegaly EXT:  2 + pulses throughout, tr edema, no cyanosis no clubbing SKIN:  Warm and dry.  No rashes NEURO:  Alert and oriented x 3. Cranial nerves II through XII intact. PSYCH:  Cognitively intact     EKG:  EKG is not ordered today.  Lab Results  Component Value Date   WBC 5.6 04/02/2016   HGB 13.5 04/02/2016   HCT 39.4 04/02/2016   PLT 143.0 (L) 04/02/2016   GLUCOSE 211 (H) 08/07/2016   CHOL 115 08/07/2016   TRIG 170.0 (H) 08/07/2016   HDL 24.20 (L) 08/07/2016   LDLDIRECT 93.1 09/29/2012   LDLCALC 57 08/07/2016   ALT 18 04/02/2016   AST 15 04/02/2016   NA 136 08/07/2016   K 4.2 08/07/2016   CL 100 08/07/2016   CREATININE 1.45 08/07/2016   BUN 18 08/07/2016   CO2 29 08/07/2016   TSH 1.91 04/02/2016   PSA 1.87 03/31/2012   INR 1.20 09/21/2014   HGBA1C 7.9 (H) 08/07/2016   MICROALBUR <0.7 04/09/2016     ASSESSMENT AND PLAN:  1. CAD: Status post  non-STEMI treated with Synergy DES to the LAD in 2016. LV function normal by echocardiogram post PCI. He is doing well without significant angina.  Continue aspirin, beta blocker. Continue statin.   2. Hypertension:  Controlled.   3. Hyperlipidemia:  Excellent control.  4. Diabetes: Follow-up with primary  care. A1C 7.9%.   5. Chronic Kidney Disease: stage 2.   I will follow up in one year.     Medication Changes: Current medicines are reviewed at length with the patient today.  Concerns regarding medicines are as outlined above.  The following changes have been made:   Discontinued Medications   No medications on file   Modified Medications   No medications on file   New Prescriptions   No medications on file    Labs/ tests ordered today include:   No orders of the defined types were placed in this encounter.    Disposition:   FU with Dr. Wahneta Derocher Martinique one year    Signed, Mahagony Grieb Martinique MD, [facility] 11/18/2016 8:05 AM

## 2016-11-18 ENCOUNTER — Encounter: Payer: Self-pay | Admitting: Cardiology

## 2016-11-18 ENCOUNTER — Ambulatory Visit (INDEPENDENT_AMBULATORY_CARE_PROVIDER_SITE_OTHER): Payer: PPO | Admitting: Cardiology

## 2016-11-18 VITALS — BP 140/60 | HR 64 | Ht 68.0 in | Wt 190.0 lb

## 2016-11-18 DIAGNOSIS — E1122 Type 2 diabetes mellitus with diabetic chronic kidney disease: Secondary | ICD-10-CM

## 2016-11-18 DIAGNOSIS — I10 Essential (primary) hypertension: Secondary | ICD-10-CM | POA: Diagnosis not present

## 2016-11-18 DIAGNOSIS — N182 Chronic kidney disease, stage 2 (mild): Secondary | ICD-10-CM

## 2016-11-18 DIAGNOSIS — I2510 Atherosclerotic heart disease of native coronary artery without angina pectoris: Secondary | ICD-10-CM

## 2016-11-18 DIAGNOSIS — E785 Hyperlipidemia, unspecified: Secondary | ICD-10-CM | POA: Diagnosis not present

## 2016-11-18 NOTE — Patient Instructions (Addendum)
Continue your current therapy  I will see you in one year   

## 2016-11-23 ENCOUNTER — Encounter: Payer: Self-pay | Admitting: Internal Medicine

## 2017-01-11 ENCOUNTER — Other Ambulatory Visit: Payer: Self-pay | Admitting: Internal Medicine

## 2017-01-28 ENCOUNTER — Ambulatory Visit (INDEPENDENT_AMBULATORY_CARE_PROVIDER_SITE_OTHER): Payer: PPO

## 2017-01-28 ENCOUNTER — Encounter (HOSPITAL_COMMUNITY): Payer: Self-pay | Admitting: Emergency Medicine

## 2017-01-28 ENCOUNTER — Other Ambulatory Visit: Payer: Self-pay

## 2017-01-28 ENCOUNTER — Encounter: Payer: Self-pay | Admitting: Cardiology

## 2017-01-28 ENCOUNTER — Ambulatory Visit (HOSPITAL_COMMUNITY)
Admission: EM | Admit: 2017-01-28 | Discharge: 2017-01-28 | Disposition: A | Discharge status: Home / Self Care | Payer: PPO | Attending: Emergency Medicine | Admitting: Emergency Medicine

## 2017-01-28 DIAGNOSIS — J209 Acute bronchitis, unspecified: Secondary | ICD-10-CM

## 2017-01-28 DIAGNOSIS — J984 Other disorders of lung: Secondary | ICD-10-CM

## 2017-01-28 DIAGNOSIS — R609 Edema, unspecified: Secondary | ICD-10-CM

## 2017-01-28 DIAGNOSIS — J0100 Acute maxillary sinusitis, unspecified: Secondary | ICD-10-CM | POA: Diagnosis not present

## 2017-01-28 DIAGNOSIS — R05 Cough: Secondary | ICD-10-CM

## 2017-01-28 DIAGNOSIS — J8410 Pulmonary fibrosis, unspecified: Secondary | ICD-10-CM

## 2017-01-28 DIAGNOSIS — R600 Localized edema: Secondary | ICD-10-CM

## 2017-01-28 DIAGNOSIS — R059 Cough, unspecified: Secondary | ICD-10-CM

## 2017-01-28 MED ORDER — AMOXICILLIN-POT CLAVULANATE 875-125 MG PO TABS: 1.0000 | ORAL_TABLET | Freq: Two times a day (BID) | ORAL | 0 refills | Status: AC

## 2017-01-28 MED ORDER — BENZONATATE 100 MG PO CAPS: 100.0000 mg | ORAL_CAPSULE | Freq: Three times a day (TID) | ORAL | 0 refills | Status: DC | PRN

## 2017-01-28 NOTE — Discharge Instructions (Addendum)
Recommend start Augmentin 875mg  twice a day as directed. Take Tessalon cough pills 1 every 8 hours as needed. Continue Mucinex or similar medication to help loosen up mucus in chest. Keep feet elevated as much as possible to help with swelling in feet. Recommend call your Cardiologist tomorrow to discuss swelling in legs and feet. Also follow-up with your PCP in 1 week as planned for recheck or go to ER if cough, difficulty breathing or fever worsens.

## 2017-01-28 NOTE — ED Triage Notes (Signed)
Pt reports nasal congestion for two weeks.  He also reports hoarseness, cough and left ear fullness.  In the last week he has been having issues with his feet swelling.  Pt has bilateral 2+ edema in his feet and both lower legs.  He states he gets swelling periodically, but it usually goes away.

## 2017-01-28 NOTE — ED Provider Notes (Signed)
[place]    CSN: [id number] Arrival date & time: 01/28/17  1004     History   Chief Complaint Chief Complaint  Patient presents with  . Nasal Congestion  . Leg Swelling    bilateral    HPI Timothy Snyder is a 83 y.o. male.   83 year old male accompanied by his son with concern over worsening of URI symptoms. Started with nasal congestion and cough over 2 weeks ago- was improving a week ago but now more congestion and cough with discolored mucus. Having more difficulty breathing and a fever of 101 a few days ago. Denies any GI symptoms. Also noticed bilateral lower leg and feet swelling over the past week. Has been unable to keep legs elevated at night as usual due to the cough since he is trying to sleep in a recliner. He has a history of CAD, DM, glaucoma, BPH and peripheral edema but no CHF. Takes Demadex daily along with various Cardiac, BPH and oral DM medications. He also has tried Nyquil and OTC Mucus relief medications with minimal relief. No other family members ill.    The history is provided by the patient and a caregiver.    Past Medical History:  Diagnosis Date  . Actinic keratosis   . ARTHRITIS   . BPH (benign prostatic hyperplasia)   . CAD (coronary artery disease)    cath 09/21/2014 1v dx, 99% mid LAD treated with DES, 50% prox to mid LAD.  Marland Kitchen DIABETES MELLITUS, TYPE II   . GERD (gastroesophageal reflux disease)   . GLUCOMA   . HYPERTENSION   . Macular degeneration    L>R vision loss, legally blind  . Restless leg syndrome     Patient Active Problem List   Diagnosis Date Noted  . Hyperlipidemia with target LDL less than 70 04/20/2015  . Localized edema 04/20/2015  . Medicare annual wellness visit, subsequent 12/23/2014  . CAD (coronary artery disease) 10/13/2014  . NSTEMI (non-ST elevated myocardial infarction) ([place]) 09/20/2014  . GERD (gastroesophageal reflux disease)   . RLS (restless legs syndrome)   . BPH without obstruction/lower  urinary tract symptoms 10/18/2009  . Controlled diabetes mellitus type 2 with complications ([place]) 23/53/6144  . GLUCOMA 10/16/2009  . Essential hypertension 10/16/2009    Past Surgical History:  Procedure Laterality Date  . CARDIAC CATHETERIZATION N/A 09/21/2014   Procedure: Left Heart Cath and Coronary Angiography;  Surgeon: Peter M Martinique, MD;  Location: [facility];  Service: Cardiovascular;  Laterality: N/A;  . CARDIAC CATHETERIZATION  09/21/2014   Procedure: Coronary Stent Intervention;  Surgeon: Peter M Martinique, MD;  Location: [facility];  Service: Cardiovascular;;  . NO PAST SURGERIES         Home Medications    Prior to Admission medications   Medication Sig Start Date End Date Taking? Authorizing Provider  alfuzosin (UROXATRAL) 10 MG 24 hr tablet Take 1 tablet (10 mg total) by mouth daily with breakfast. 04/09/16  Yes Janith Lima, MD  aspirin 81 MG tablet Take 81 mg by mouth daily.   Yes [provider]  carvedilol (COREG) 3.125 MG tablet Take 1 tablet (3.125 mg total) by mouth 2 (two) times daily with a meal. 04/10/16  Yes Janith Lima, MD  clonazePAM (KLONOPIN) 0.5 MG tablet TAKE 1 TABLET BY MOUTH EVERY NIGHT AT BEDTIME 09/30/16  Yes Janith Lima, MD  finasteride (PROSCAR) 5 MG tablet Take 1 tablet (5 mg total) by mouth  daily. 05/24/16  Yes Janith Lima, MD  glimepiride (AMARYL) 2 MG tablet TAKE 1 TABLET(2 MG) BY MOUTH DAILY WITH BREAKFAST 01/12/17  Yes Janith Lima, MD  KRILL OIL PO Take 1 tablet by mouth daily.   Yes [provider]  lisinopril (PRINIVIL,ZESTRIL) 2.5 MG tablet TAKE 1 TABLET(2.5 MG) BY MOUTH DAILY 09/19/16  Yes Janith Lima, MD  Loratadine 10 MG CAPS Take 10 mg by mouth 2 (two) times daily.   Yes [provider]  metFORMIN (GLUCOPHAGE-XR) 750 MG 24 hr tablet Take 1 tablet (750 mg total) by mouth daily with breakfast. 09/26/16  Yes Janith Lima, MD  omeprazole (PRILOSEC) 20 MG capsule TAKE 1 CAPSULE(20 MG)  BY MOUTH DAILY 09/08/16  Yes Janith Lima, MD  pravastatin (PRAVACHOL) 20 MG tablet Take 1 tablet (20 mg total) by mouth every evening. 04/10/16  Yes Janith Lima, MD  timolol (TIMOPTIC) 0.5 % ophthalmic solution Place 1 drop into both eyes 2 (two) times daily. 02/20/15  Yes [provider]  torsemide (DEMADEX) 10 MG tablet TAKE 1 TABLET(10 MG) BY MOUTH DAILY 09/19/16  Yes Janith Lima, MD  amoxicillin-clavulanate (AUGMENTIN) 875-125 MG tablet Take 1 tablet by mouth every 12 (twelve) hours for 7 days. 01/28/17 02/04/17  Katy Apo, NP  benzonatate (TESSALON) 100 MG capsule Take 1 capsule (100 mg total) by mouth 3 (three) times daily as needed for cough. 01/28/17   Katy Apo, NP  beta carotene w/minerals (OCUVITE) tablet Take 1 tablet by mouth at bedtime.     [provider]  glucose blood (PRODIGY NO CODING BLOOD GLUC) test strip 1 each by Other route daily. Use as instructed 05/04/12   Rowe Clack, MD  nitroGLYCERIN (NITROSTAT) 0.4 MG SL tablet Place 1 tablet (0.4 mg total) under the tongue every 5 (five) minutes x 3 doses as needed for chest pain. 09/26/16   Martinique, Peter M, MD  PRODIGY LANCETS 26G MISC 1 each by Does not apply route daily. Dx code 250.00 05/04/12   Rowe Clack, MD    Family History Family History  Problem Relation Age of Onset  . Coronary artery disease Mother 24       MI  . Heart failure Mother   . Cancer Father     Social History Social History   Tobacco Use  . Smoking status: Never Smoker  . Smokeless tobacco: Never Used  Substance Use Topics  . Alcohol use: No  . Drug use: No     Allergies   Brinzolamide-brimonidine and Other   Review of Systems Review of Systems  Constitutional: Positive for chills, fatigue and fever. Negative for appetite change.  HENT: Positive for congestion, ear pain (bilateral ear fullness), postnasal drip, rhinorrhea, sinus pressure, sore throat and voice change (laryngitis). Negative  for ear discharge, facial swelling, nosebleeds, sinus pain, sneezing and trouble swallowing.   Eyes: Negative for discharge, redness and itching.  Respiratory: Positive for cough and wheezing. Negative for chest tightness and shortness of breath.   Cardiovascular: Negative for chest pain.  Gastrointestinal: Negative for diarrhea, nausea and vomiting.  Musculoskeletal: Positive for arthralgias. Negative for neck pain and neck stiffness.  Skin: Negative for rash and wound.  Neurological: Positive for headaches. Negative for dizziness, tremors, seizures, syncope, facial asymmetry, weakness, light-headedness and numbness.  Hematological: Negative for adenopathy. Does not bruise/bleed easily.     Physical Exam Triage Vital Signs ED Triage Vitals [01/28/17 1028]  Enc Vitals Group  BP 113/62     Pulse Rate 77     Resp      Temp 97.9 F (36.6 C)     Temp Source Oral     SpO2 93 %     Weight      Height      Head Circumference      Peak Flow      Pain Score      Pain Loc      Pain Edu?      Excl. in [place]?    No data found.  Updated Vital Signs BP 113/62 (BP Location: Left Arm)   Pulse 77   Temp 97.9 F (36.6 C) (Oral)   SpO2 93%   Visual Acuity Right Eye Distance:   Left Eye Distance:   Bilateral Distance:    Right Eye Near:   Left Eye Near:    Bilateral Near:     Physical Exam  Constitutional: He is oriented to person, place, and time. He appears well-developed and well-nourished. He appears ill. No distress.  HENT:  Head: Normocephalic and atraumatic.  Right Ear: Hearing, tympanic membrane, external ear and ear canal normal.  Left Ear: Hearing, tympanic membrane, external ear and ear canal normal.  Nose: Mucosal edema and rhinorrhea present. Right sinus exhibits no maxillary sinus tenderness and no frontal sinus tenderness. Left sinus exhibits no maxillary sinus tenderness and no frontal sinus tenderness.  Mouth/Throat: Uvula is midline and mucous membranes are  normal. He has dentures. Posterior oropharyngeal erythema present.  Eyes: Conjunctivae and EOM are normal.  Neck: Normal range of motion. Neck supple.  Cardiovascular: Normal rate, regular rhythm and normal heart sounds.  No murmur heard. Pulmonary/Chest: No accessory muscle usage or stridor. No respiratory distress. He has decreased breath sounds in the left upper field and the left lower field. He has wheezes in the right upper field, the right middle field and the right lower field. He has rhonchi in the right upper field, the right middle field and the right lower field. He has no rales.  Musculoskeletal: He exhibits edema. He exhibits no tenderness.  Lymphadenopathy:    He has no cervical adenopathy.  Neurological: He is alert and oriented to person, place, and time. He has normal strength. No sensory deficit.  Skin: Skin is warm and dry. Capillary refill takes less than 2 seconds. No bruising and no ecchymosis noted. No erythema.     Has full range of motion of both legs and feet. Has 2 + pitting edema present bilaterally in dorsal aspect of feet and just above ankles. Mild swelling present from ankles to knees. No redness, tightness, or bruising. Non-tender. Good distal pulses and capillary refill.   Psychiatric: He has a normal mood and affect. His behavior is normal.     UC Treatments / Results  Labs (all labs ordered are listed, but only abnormal results are displayed) Labs Reviewed - No data to display  EKG  EKG Interpretation None       Radiology Dg Chest 2 View  Result Date: 01/28/2017 CLINICAL DATA:  Cough and hoarseness EXAM: CHEST  2 VIEW COMPARISON:  April 02, 2016 FINDINGS: There is fibrotic change in lung bases. There is a calcified granuloma in the left lower lobe laterally. There is no edema or consolidation. Heart size is upper normal with pulmonary vascularity within normal limits. No adenopathy. There is aortic atherosclerosis. There is degenerative change  in the thoracic spine. There is a stent in the left  anterior descending coronary artery. IMPRESSION: Bibasilar lung fibrosis. Granuloma lateral left base. No edema or consolidation. Stable cardiac silhouette. There is aortic atherosclerosis. Aortic Atherosclerosis (ICD10-I70.0). Electronically Signed   By: Lowella Grip III M.D.   On: 01/28/2017 11:27    Procedures Procedures (including critical care time)  Medications Ordered in UC Medications - No data to display   Initial Impression / Assessment and Plan / UC Course  I have reviewed the triage vital signs and the nursing notes.  Pertinent labs & imaging results that were available during my care of the patient were reviewed by me and considered in my medical decision making (see chart for details).    Reviewed x-ray results with patient and son. No distinct pneumonia detected. Reviewed fibrotic changes bilaterally in lungs. May need further evaluation of granuloma. Discussed that he probably has a sinus infection with bronchitis. He appears stable and can start with out-patient treatment at this time. Recommend start Augmentin 875mg  twice a day as directed. Take Tessalon cough pills 1 every 8 hours as needed. Continue Mucinex or similar medication to help loosen up mucus in chest. May consider Albuterol inhaler if symptoms continue or worsen. Discussed that peripheral swelling may be due to positioning but recommend notify his Cardiologist tomorrow.  Keep feet elevated as much as possible to help with swelling in feet. Patient has routine appointment with his PCP on 02/06/17. Recommend follow-up with his PCP at that time or if any difficulty breathing, worsening of cough or fever develops, go to ER ASAP.     Final Clinical Impressions(s) / UC Diagnoses   Final diagnoses:  Acute non-recurrent maxillary sinusitis  Acute bronchitis, unspecified organism  Peripheral edema  Cough  Calcified granuloma of lung [facility])    ED Discharge  Orders        Ordered    amoxicillin-clavulanate (AUGMENTIN) 875-125 MG tablet  Every 12 hours     01/28/17 1211    benzonatate (TESSALON) 100 MG capsule  3 times daily PRN     01/28/17 1211       Controlled Substance Prescriptions [place] Controlled Substance Registry consulted? Not Applicable   Katy Apo, NP 01/28/17 1723

## 2017-02-04 ENCOUNTER — Encounter: Payer: Self-pay | Admitting: Internal Medicine

## 2017-02-06 ENCOUNTER — Ambulatory Visit: Payer: PPO | Admitting: Internal Medicine

## 2017-02-06 ENCOUNTER — Encounter: Payer: Self-pay | Admitting: Internal Medicine

## 2017-02-06 VITALS — BP 112/58 | HR 63 | Temp 98.9°F | Resp 16 | Ht 68.0 in | Wt 188.8 lb

## 2017-02-06 DIAGNOSIS — J069 Acute upper respiratory infection, unspecified: Secondary | ICD-10-CM | POA: Diagnosis not present

## 2017-02-06 NOTE — Progress Notes (Signed)
Subjective:  Patient ID: Timothy Snyder, male    DOB: [birth year]  Age: 83 y.o. MRN: [id number]  CC: URI   HPI Fed Timothy Snyder presents for f/up - he was seen in the ED about 9 days ago for URI symptoms.  Fortunately, he tells me that he is feeling much better.  He has minimal nonproductive cough but denies shortness of breath, fever, chills, or hemoptysis.  He has stopped taking the cough suppressant.  Outpatient Medications Prior to Visit  Medication Sig Dispense Refill  . alfuzosin (UROXATRAL) 10 MG 24 hr tablet Take 1 tablet (10 mg total) by mouth daily with breakfast. 90 tablet 3  . aspirin 81 MG tablet Take 81 mg by mouth daily.    . beta carotene w/minerals (OCUVITE) tablet Take 1 tablet by mouth at bedtime.     . carvedilol (COREG) 3.125 MG tablet Take 1 tablet (3.125 mg total) by mouth 2 (two) times daily with a meal. 180 tablet 3  . clonazePAM (KLONOPIN) 0.5 MG tablet TAKE 1 TABLET BY MOUTH EVERY NIGHT AT BEDTIME 30 tablet 5  . finasteride (PROSCAR) 5 MG tablet Take 1 tablet (5 mg total) by mouth daily. 90 tablet 3  . glimepiride (AMARYL) 2 MG tablet TAKE 1 TABLET(2 MG) BY MOUTH DAILY WITH BREAKFAST 90 tablet 0  . glucose blood (PRODIGY NO CODING BLOOD GLUC) test strip 1 each by Other route daily. Use as instructed 100 each 1  . KRILL OIL PO Take 1 tablet by mouth daily.    Marland Kitchen lisinopril (PRINIVIL,ZESTRIL) 2.5 MG tablet TAKE 1 TABLET(2.5 MG) BY MOUTH DAILY 90 tablet 1  . Loratadine 10 MG CAPS Take 10 mg by mouth 2 (two) times daily.    . metFORMIN (GLUCOPHAGE-XR) 750 MG 24 hr tablet Take 1 tablet (750 mg total) by mouth daily with breakfast. 90 tablet 1  . nitroGLYCERIN (NITROSTAT) 0.4 MG SL tablet Place 1 tablet (0.4 mg total) under the tongue every 5 (five) minutes x 3 doses as needed for chest pain. 25 tablet 11  . omeprazole (PRILOSEC) 20 MG capsule TAKE 1 CAPSULE(20 MG) BY MOUTH DAILY 90 capsule 1  . pravastatin (PRAVACHOL) 20 MG tablet Take 1 tablet (20 mg total) by mouth  every evening. 90 tablet 3  . PRODIGY LANCETS 26G MISC 1 each by Does not apply route daily. Dx code 250.00 100 each 1  . timolol (TIMOPTIC) 0.5 % ophthalmic solution Place 1 drop into both eyes 2 (two) times daily.  2  . torsemide (DEMADEX) 10 MG tablet TAKE 1 TABLET(10 MG) BY MOUTH DAILY 90 tablet 1  . benzonatate (TESSALON) 100 MG capsule Take 1 capsule (100 mg total) by mouth 3 (three) times daily as needed for cough. 21 capsule 0   No facility-administered medications prior to visit.     ROS Review of Systems  Constitutional: Negative.  Negative for chills, diaphoresis, fatigue and fever.  HENT: Negative.   Eyes: Negative.  Negative for visual disturbance.  Respiratory: Positive for cough. Negative for chest tightness, shortness of breath and wheezing.   Cardiovascular: Negative.  Negative for chest pain, palpitations and leg swelling.  Gastrointestinal: Negative for abdominal pain, constipation, diarrhea, nausea and vomiting.  Endocrine: Negative.   Genitourinary: Negative.   Musculoskeletal: Negative.  Negative for back pain, myalgias and neck pain.  Skin: Negative for color change and rash.  Allergic/Immunologic: Negative.   Neurological: Negative.  Negative for dizziness, weakness and light-headedness.  Hematological: Negative for adenopathy. Does not bruise/bleed  easily.  Psychiatric/Behavioral: Negative.     Objective:  BP (!) 112/58 (BP Location: Left Arm, Patient Position: Sitting, Cuff Size: Normal)   Pulse 63   Temp 98.9 F (37.2 C) (Oral)   Resp 16   Ht 5\' 8"  (1.727 m)   Wt 188 lb 12 oz (85.6 kg)   SpO2 98%   BMI 28.70 kg/m   BP Readings from Last 3 Encounters:  02/06/17 (!) 112/58  01/28/17 113/62  11/18/16 140/60    Wt Readings from Last 3 Encounters:  02/06/17 188 lb 12 oz (85.6 kg)  11/18/16 190 lb (86.2 kg)  08/07/16 195 lb (88.5 kg)    Physical Exam  Constitutional: He is oriented to person, place, and time. No distress.  HENT:    Mouth/Throat: Oropharynx is clear and moist. No oropharyngeal exudate.  Eyes: Conjunctivae are normal. Left eye exhibits no discharge. No scleral icterus.  Neck: Normal range of motion. Neck supple. No JVD present. No thyromegaly present.  Cardiovascular: Normal rate, regular rhythm and normal heart sounds.  No murmur heard. Pulmonary/Chest: Effort normal and breath sounds normal. No respiratory distress. He has no wheezes. He has no rales.  Abdominal: Bowel sounds are normal. He exhibits no mass. There is no tenderness.  Musculoskeletal: Normal range of motion. He exhibits no edema or tenderness.  Lymphadenopathy:    He has no cervical adenopathy.  Neurological: He is alert and oriented to person, place, and time.  Skin: Skin is warm and dry. No rash noted. No erythema. No pallor.  Vitals reviewed.   Lab Results  Component Value Date   WBC 5.6 04/02/2016   HGB 13.5 04/02/2016   HCT 39.4 04/02/2016   PLT 143.0 (L) 04/02/2016   GLUCOSE 211 (H) 08/07/2016   CHOL 115 08/07/2016   TRIG 170.0 (H) 08/07/2016   HDL 24.20 (L) 08/07/2016   LDLDIRECT 93.1 09/29/2012   LDLCALC 57 08/07/2016   ALT 18 04/02/2016   AST 15 04/02/2016   NA 136 08/07/2016   K 4.2 08/07/2016   CL 100 08/07/2016   CREATININE 1.45 08/07/2016   BUN 18 08/07/2016   CO2 29 08/07/2016   TSH 1.91 04/02/2016   PSA 1.87 03/31/2012   INR 1.20 09/21/2014   HGBA1C 7.9 (H) 08/07/2016   MICROALBUR <0.7 04/09/2016    Dg Chest 2 View   Result Date: 01/28/2017 CLINICAL DATA:  Cough and hoarseness EXAM: CHEST  2 VIEW COMPARISON:  April 02, 2016 FINDINGS: There is fibrotic change in lung bases. There is a calcified granuloma in the left lower lobe laterally. There is no edema or consolidation. Heart size is upper normal with pulmonary vascularity within normal limits. No adenopathy. There is aortic atherosclerosis. There is degenerative change in the thoracic spine. There is a stent in the left anterior descending  coronary artery. IMPRESSION: Bibasilar lung fibrosis. Granuloma lateral left base. No edema or consolidation. Stable cardiac silhouette. There is aortic atherosclerosis. Aortic Atherosclerosis (ICD10-I70.0). Electronically Signed   By: Lowella Grip III M.D.   On: 01/28/2017 11:27    Assessment & Plan:   Aysen was seen today for uri.  Diagnoses and all orders for this visit:  Viral upper respiratory tract infection- based on his symptoms and exam he is improving.  There is no evidence of secondary bacterial infection at this time.  He does not feel ill enough to want something for symptom relief.  He will let me know if he develops any new or worsening symptoms.   I  have discontinued Hillman F. Cavell's benzonatate. I am also having him maintain his beta carotene w/minerals, aspirin, glucose blood, PRODIGY LANCETS 26G, Loratadine, KRILL OIL PO, timolol, alfuzosin, carvedilol, pravastatin, finasteride, omeprazole, lisinopril, torsemide, metFORMIN, nitroGLYCERIN, clonazePAM, and glimepiride.  No orders of the defined types were placed in this encounter.    Follow-up: Return if symptoms worsen or fail to improve.  Scarlette Calico, MD

## 2017-02-06 NOTE — Patient Instructions (Signed)
Laryngitis Laryngitis is inflammation of your vocal cords. This causes hoarseness, coughing, loss of voice, sore throat, or a dry throat. Your vocal cords are two bands of muscles that are found in your throat. When you speak, these cords come together and vibrate. These vibrations come out through your mouth as sound. When your vocal cords are inflamed, your voice sounds different. Laryngitis can be temporary (acute) or long-term (chronic). Most cases of acute laryngitis improve with time. Chronic laryngitis is laryngitis that lasts for more than three weeks. What are the causes? Acute laryngitis may be caused by:  A viral infection.  Lots of talking, yelling, or singing. This is also called vocal strain.  Bacterial infections.  Chronic laryngitis may be caused by:  Vocal strain.  Injury to your vocal cords.  Acid reflux (gastroesophageal reflux disease or GERD).  Allergies.  Sinus infection.  Smoking.  Alcohol abuse.  Breathing in chemicals or dust.  Growths on the vocal cords.  What increases the risk? Risk factors for laryngitis include:  Smoking.  Alcohol abuse.  Having allergies.  What are the signs or symptoms? Symptoms of laryngitis may include:  Low, hoarse voice.  Loss of voice.  Dry cough.  Sore throat.  Stuffy nose.  How is this diagnosed? Laryngitis may be diagnosed by:  Physical exam.  Throat culture.  Blood test.  Laryngoscopy. This procedure allows your health care provider to look at your vocal cords with a mirror or viewing tube.  How is this treated? Treatment for laryngitis depends on what is causing it. Usually, treatment involves resting your voice and using medicines to soothe your throat. However, if your laryngitis is caused by a bacterial infection, you may need to take antibiotic medicine. If your laryngitis is caused by a growth, you may need to have a procedure to remove it. Follow these instructions at home:  Drink  enough fluid to keep your urine clear or pale yellow.  Breathe in moist air. Use a humidifier if you live in a dry climate.  Take medicines only as directed by your health care provider.  If you were prescribed an antibiotic medicine, finish it all even if you start to feel better.  Do not smoke cigarettes or electronic cigarettes. If you need help quitting, ask your health care provider.  Talk as little as possible. Also avoid whispering, which can cause vocal strain.  Write instead of talking. Do this until your voice is back to normal. Contact a health care provider if:  You have a fever.  You have increasing pain.  You have difficulty swallowing. Get help right away if:  You cough up blood.  You have trouble breathing. This information is not intended to replace advice given to you by your health care provider. Make sure you discuss any questions you have with your health care provider. Document Released: 02/04/2005 Document Revised: 07/13/2015 Document Reviewed: 07/20/2013 Elsevier Interactive Patient Education  2018 [employer]  

## 2017-02-13 ENCOUNTER — Encounter: Payer: Self-pay | Admitting: Internal Medicine

## 2017-02-19 ENCOUNTER — Encounter: Payer: Self-pay | Admitting: Internal Medicine

## 2017-02-24 ENCOUNTER — Other Ambulatory Visit: Payer: Self-pay | Admitting: Internal Medicine

## 2017-02-26 NOTE — Telephone Encounter (Signed)
Forms have been signed, copy sent to scan, LVM to inform patient.

## 2017-02-26 NOTE — Telephone Encounter (Signed)
Forms have been completed & placed in MD's box to review & sign.

## 2017-03-09 ENCOUNTER — Other Ambulatory Visit: Payer: Self-pay | Admitting: Internal Medicine

## 2017-03-09 DIAGNOSIS — E118 Type 2 diabetes mellitus with unspecified complications: Secondary | ICD-10-CM

## 2017-03-09 DIAGNOSIS — R600 Localized edema: Secondary | ICD-10-CM

## 2017-03-09 DIAGNOSIS — I2510 Atherosclerotic heart disease of native coronary artery without angina pectoris: Secondary | ICD-10-CM

## 2017-03-09 DIAGNOSIS — Z794 Long term (current) use of insulin: Secondary | ICD-10-CM

## 2017-03-09 DIAGNOSIS — I10 Essential (primary) hypertension: Secondary | ICD-10-CM

## 2017-03-19 ENCOUNTER — Other Ambulatory Visit: Payer: Self-pay | Admitting: Internal Medicine

## 2017-03-19 DIAGNOSIS — I2583 Coronary atherosclerosis due to lipid rich plaque: Secondary | ICD-10-CM

## 2017-03-19 DIAGNOSIS — I10 Essential (primary) hypertension: Secondary | ICD-10-CM

## 2017-03-19 DIAGNOSIS — E785 Hyperlipidemia, unspecified: Secondary | ICD-10-CM

## 2017-03-19 DIAGNOSIS — E118 Type 2 diabetes mellitus with unspecified complications: Secondary | ICD-10-CM

## 2017-03-19 DIAGNOSIS — I2510 Atherosclerotic heart disease of native coronary artery without angina pectoris: Secondary | ICD-10-CM

## 2017-03-19 DIAGNOSIS — I214 Non-ST elevation (NSTEMI) myocardial infarction: Secondary | ICD-10-CM

## 2017-03-19 DIAGNOSIS — N400 Enlarged prostate without lower urinary tract symptoms: Secondary | ICD-10-CM

## 2017-04-07 ENCOUNTER — Other Ambulatory Visit: Payer: Self-pay | Admitting: Internal Medicine

## 2017-04-10 ENCOUNTER — Other Ambulatory Visit: Payer: Self-pay | Admitting: Internal Medicine

## 2017-04-30 LAB — HM DIABETES EYE EXAM

## 2017-05-07 ENCOUNTER — Other Ambulatory Visit: Payer: Self-pay | Admitting: Internal Medicine

## 2017-06-12 DIAGNOSIS — D0462 Carcinoma in situ of skin of left upper limb, including shoulder: Secondary | ICD-10-CM | POA: Diagnosis not present

## 2017-06-12 DIAGNOSIS — Z85828 Personal history of other malignant neoplasm of skin: Secondary | ICD-10-CM | POA: Diagnosis not present

## 2017-06-12 DIAGNOSIS — D485 Neoplasm of uncertain behavior of skin: Secondary | ICD-10-CM | POA: Diagnosis not present

## 2017-06-12 DIAGNOSIS — Z808 Family history of malignant neoplasm of other organs or systems: Secondary | ICD-10-CM | POA: Diagnosis not present

## 2017-06-12 DIAGNOSIS — D2272 Melanocytic nevi of left lower limb, including hip: Secondary | ICD-10-CM | POA: Diagnosis not present

## 2017-06-12 DIAGNOSIS — L570 Actinic keratosis: Secondary | ICD-10-CM | POA: Diagnosis not present

## 2017-06-14 ENCOUNTER — Other Ambulatory Visit: Payer: Self-pay | Admitting: Internal Medicine

## 2017-06-14 DIAGNOSIS — I2510 Atherosclerotic heart disease of native coronary artery without angina pectoris: Secondary | ICD-10-CM

## 2017-06-14 DIAGNOSIS — I214 Non-ST elevation (NSTEMI) myocardial infarction: Secondary | ICD-10-CM

## 2017-06-14 DIAGNOSIS — E118 Type 2 diabetes mellitus with unspecified complications: Secondary | ICD-10-CM

## 2017-06-14 DIAGNOSIS — I10 Essential (primary) hypertension: Secondary | ICD-10-CM

## 2017-06-30 ENCOUNTER — Other Ambulatory Visit: Payer: Self-pay | Admitting: Internal Medicine

## 2017-07-11 DIAGNOSIS — C44629 Squamous cell carcinoma of skin of left upper limb, including shoulder: Secondary | ICD-10-CM | POA: Diagnosis not present

## 2017-07-30 ENCOUNTER — Other Ambulatory Visit: Payer: Self-pay | Admitting: Internal Medicine

## 2017-08-07 ENCOUNTER — Other Ambulatory Visit (INDEPENDENT_AMBULATORY_CARE_PROVIDER_SITE_OTHER): Payer: PPO

## 2017-08-07 ENCOUNTER — Encounter: Payer: Self-pay | Admitting: Internal Medicine

## 2017-08-07 ENCOUNTER — Ambulatory Visit (INDEPENDENT_AMBULATORY_CARE_PROVIDER_SITE_OTHER): Payer: PPO | Admitting: Internal Medicine

## 2017-08-07 VITALS — BP 116/70 | HR 71 | Temp 98.1°F | Resp 16 | Ht 68.0 in | Wt 183.0 lb

## 2017-08-07 DIAGNOSIS — E118 Type 2 diabetes mellitus with unspecified complications: Secondary | ICD-10-CM

## 2017-08-07 DIAGNOSIS — I2510 Atherosclerotic heart disease of native coronary artery without angina pectoris: Secondary | ICD-10-CM

## 2017-08-07 DIAGNOSIS — I10 Essential (primary) hypertension: Secondary | ICD-10-CM

## 2017-08-07 DIAGNOSIS — R0789 Other chest pain: Secondary | ICD-10-CM

## 2017-08-07 DIAGNOSIS — K2100 Gastro-esophageal reflux disease with esophagitis, without bleeding: Secondary | ICD-10-CM

## 2017-08-07 DIAGNOSIS — I25728 Atherosclerosis of autologous artery coronary artery bypass graft(s) with other forms of angina pectoris: Secondary | ICD-10-CM | POA: Diagnosis not present

## 2017-08-07 DIAGNOSIS — E785 Hyperlipidemia, unspecified: Secondary | ICD-10-CM

## 2017-08-07 LAB — MICROALBUMIN / CREATININE URINE RATIO
Creatinine,U: 146.8 mg/dL
MICROALB/CREAT RATIO: 0.5 mg/g (ref 0.0–30.0)
Microalb, Ur: 0.7 mg/dL (ref 0.0–1.9)

## 2017-08-07 LAB — URINALYSIS, ROUTINE W REFLEX MICROSCOPIC
Bilirubin Urine: NEGATIVE
KETONES UR: NEGATIVE
Leukocytes, UA: NEGATIVE
NITRITE: NEGATIVE
Specific Gravity, Urine: 1.02 (ref 1.000–1.030)
Total Protein, Urine: NEGATIVE
Urine Glucose: NEGATIVE
Urobilinogen, UA: 0.2 (ref 0.0–1.0)
pH: 5.5 (ref 5.0–8.0)

## 2017-08-07 LAB — CBC WITH DIFFERENTIAL/PLATELET
BASOS ABS: 0.1 10*3/uL (ref 0.0–0.1)
Basophils Relative: 0.7 % (ref 0.0–3.0)
Eosinophils Absolute: 0.2 10*3/uL (ref 0.0–0.7)
Eosinophils Relative: 2 % (ref 0.0–5.0)
HEMATOCRIT: 44.3 % (ref 39.0–52.0)
Hemoglobin: 15.2 g/dL (ref 13.0–17.0)
LYMPHS PCT: 36.5 % (ref 12.0–46.0)
Lymphs Abs: 3.1 10*3/uL (ref 0.7–4.0)
MCHC: 34.3 g/dL (ref 30.0–36.0)
MCV: 87.6 fl (ref 78.0–100.0)
MONOS PCT: 10.8 % (ref 3.0–12.0)
Monocytes Absolute: 0.9 10*3/uL (ref 0.1–1.0)
Neutro Abs: 4.3 10*3/uL (ref 1.4–7.7)
Neutrophils Relative %: 50 % (ref 43.0–77.0)
Platelets: 203 10*3/uL (ref 150.0–400.0)
RBC: 5.06 Mil/uL (ref 4.22–5.81)
RDW: 14.1 % (ref 11.5–15.5)
WBC: 8.6 10*3/uL (ref 4.0–10.5)

## 2017-08-07 LAB — COMPREHENSIVE METABOLIC PANEL
ALK PHOS: 84 U/L (ref 39–117)
ALT: 28 U/L (ref 0–53)
AST: 25 U/L (ref 0–37)
Albumin: 4.2 g/dL (ref 3.5–5.2)
BILIRUBIN TOTAL: 0.8 mg/dL (ref 0.2–1.2)
BUN: 25 mg/dL — AB (ref 6–23)
CO2: 27 mEq/L (ref 19–32)
CREATININE: 1.58 mg/dL — AB (ref 0.40–1.50)
Calcium: 9.5 mg/dL (ref 8.4–10.5)
Chloride: 101 mEq/L (ref 96–112)
GFR: 44.2 mL/min — AB (ref >60.00)
Glucose, Bld: 211 mg/dL — ABNORMAL HIGH (ref 70–99)
Potassium: 4.2 mEq/L (ref 3.5–5.1)
Sodium: 139 mEq/L (ref 135–145)
TOTAL PROTEIN: 7.1 g/dL (ref 6.0–8.3)

## 2017-08-07 LAB — HM DIABETES FOOT EXAM

## 2017-08-07 LAB — LIPID PANEL
CHOL/HDL RATIO: 4
Cholesterol: 118 mg/dL (ref 0–200)
HDL: 26.5 mg/dL — ABNORMAL LOW (ref >39.00)
LDL Cholesterol: 57 mg/dL (ref 0–99)
NONHDL: 91.53
Triglycerides: 172 mg/dL — ABNORMAL HIGH (ref 0.0–149.0)
VLDL: 34.4 mg/dL (ref 0.0–40.0)

## 2017-08-07 LAB — TSH: TSH: 1.85 u[IU]/mL (ref 0.35–4.50)

## 2017-08-07 LAB — HEMOGLOBIN A1C: HEMOGLOBIN A1C: 6.4 % (ref 4.6–6.5)

## 2017-08-07 LAB — TROPONIN I: TNIDX: 0.03 ug/L (ref 0.00–0.06)

## 2017-08-07 MED ORDER — DEXLANSOPRAZOLE 60 MG PO CPDR: 60.0000 mg | DELAYED_RELEASE_CAPSULE | Freq: Every day | ORAL | 1 refills | Status: DC

## 2017-08-07 MED ORDER — ALUM HYDROXIDE-MAG CARBONATE 160-105 MG PO CHEW: 1.0000 | CHEWABLE_TABLET | Freq: Three times a day (TID) | ORAL | 5 refills | Status: DC | PRN

## 2017-08-07 NOTE — Progress Notes (Signed)
Subjective:  Patient ID: Timothy Snyder, male    DOB: Sep 17, 1927  Age: 84 y.o. MRN: [id number]  CC: Diabetes; Chest Pain; and Gastroesophageal Reflux   HPI Freddi F Bucker presents for f/up - he complains of a several week history of worsening heartburn with belching and indigestion.  He has also noted intermittent episodes of chest tightness and worsening DOE only when he climbs a hill.  He can walk on a flat surface without experiencing SOB, DOE, or chest pain.  He has used nitroglycerin but he did not put it under his tongue instead he swallowed it.  He tells me that did not help with any of his symptoms.  He denies odynophagia, dysphagia, diaphoresis, dizziness, lightheadedness, or near syncope.  Outpatient Medications Prior to Visit  Medication Sig Dispense Refill  . alfuzosin (UROXATRAL) 10 MG 24 hr tablet TAKE 1 TABLET(10 MG) BY MOUTH DAILY WITH BREAKFAST 90 tablet 1  . aspirin 81 MG tablet Take 81 mg by mouth daily.    . beta carotene w/minerals (OCUVITE) tablet Take 1 tablet by mouth at bedtime.     . carvedilol (COREG) 3.125 MG tablet TAKE 1 TABLET(3.125 MG) BY MOUTH TWICE DAILY WITH A MEAL 180 tablet 1  . clonazePAM (KLONOPIN) 0.5 MG tablet TAKE 1 TABLET BY MOUTH EVERY NIGHT AT BEDTIME 30 tablet 5  . finasteride (PROSCAR) 5 MG tablet TAKE 1 TABLET(5 MG) BY MOUTH DAILY 90 tablet 0  . glimepiride (AMARYL) 2 MG tablet Take 1 tablet (2 mg total) by mouth daily with breakfast. Must keep follow-up appt for future refills 90 tablet 0  . glucose blood (PRODIGY NO CODING BLOOD GLUC) test strip 1 each by Other route daily. Use as instructed 100 each 1  . KRILL OIL PO Take 1 tablet by mouth daily.    Marland Kitchen lisinopril (PRINIVIL,ZESTRIL) 2.5 MG tablet TAKE 1 TABLET(2.5 MG) BY MOUTH DAILY 90 tablet 1  . Loratadine 10 MG CAPS Take 10 mg by mouth 2 (two) times daily.    . metFORMIN (GLUCOPHAGE-XR) 750 MG 24 hr tablet TAKE 1 TABLET(750 MG) BY MOUTH DAILY WITH BREAKFAST 90 tablet 1  . nitroGLYCERIN  (NITROSTAT) 0.4 MG SL tablet Place 1 tablet (0.4 mg total) under the tongue every 5 (five) minutes x 3 doses as needed for chest pain. 25 tablet 11  . pravastatin (PRAVACHOL) 20 MG tablet TAKE 1 TABLET(20 MG) BY MOUTH EVERY EVENING 90 tablet 1  . PRODIGY LANCETS 26G MISC 1 each by Does not apply route daily. Dx code 250.00 100 each 1  . timolol (TIMOPTIC) 0.5 % ophthalmic solution Place 1 drop into both eyes 2 (two) times daily.  2  . torsemide (DEMADEX) 10 MG tablet TAKE 1 TABLET(10 MG) BY MOUTH DAILY 90 tablet 1  . omeprazole (PRILOSEC) 20 MG capsule TAKE 1 CAPSULE(20 MG) BY MOUTH DAILY 90 capsule 1   No facility-administered medications prior to visit.     ROS Review of Systems  Constitutional: Negative for diaphoresis, fatigue and unexpected weight change.  HENT: Negative.  Negative for trouble swallowing and voice change.   Respiratory: Positive for chest tightness and shortness of breath. Negative for cough and wheezing.   Cardiovascular: Positive for chest pain. Negative for palpitations and leg swelling.  Gastrointestinal: Negative for abdominal pain, constipation, diarrhea, nausea and vomiting.  Endocrine: Negative.  Negative for polydipsia, polyphagia and polyuria.  Genitourinary: Negative.  Negative for difficulty urinating.  Musculoskeletal: Negative.  Negative for back pain, myalgias and neck pain.  Skin: Negative.   Neurological: Negative.  Negative for dizziness, weakness and light-headedness.  Hematological: Negative for adenopathy. Does not bruise/bleed easily.  Psychiatric/Behavioral: Negative.     Objective:  BP 116/70 (BP Location: Left Arm, Patient Position: Sitting, Cuff Size: Normal)   Pulse 71   Temp 98.1 F (36.7 C) (Oral)   Resp 16   Ht 5\' 8"  (1.727 m)   Wt 183 lb (83 kg)   SpO2 96%   BMI 27.83 kg/m   BP Readings from Last 3 Encounters:  08/07/17 116/70  02/06/17 (!) 112/58  01/28/17 113/62    Wt Readings from Last 3 Encounters:  08/07/17 183 lb  (83 kg)  02/06/17 188 lb 12 oz (85.6 kg)  11/18/16 190 lb (86.2 kg)    Physical Exam  Constitutional: He is oriented to person, place, and time.  Non-toxic appearance. He does not appear ill. No distress.  Neck: Normal range of motion. Neck supple. No JVD present. No tracheal deviation present.  Cardiovascular: Normal rate and regular rhythm.  No extrasystoles are present. PMI is not displaced. Exam reveals no gallop, no S3 and no S4.  No murmur heard. Pulses:      Carotid pulses are 1+ on the right side, and 1+ on the left side.      Radial pulses are 1+ on the right side, and 1+ on the left side.       Dorsalis pedis pulses are 1+ on the right side, and 1+ on the left side.       Posterior tibial pulses are 1+ on the right side, and 1+ on the left side.  EKG --  Sinus  Rhythm  -First degree A-V block  PRi = 242 Low voltage in limb leads.   -Old inferior infarct.   ABNORMAL -the inferior changes are more prominent than they were compared to the prior EKG of a few months ago.  Pulmonary/Chest: Effort normal. He has no decreased breath sounds. He has no wheezes. He has no rhonchi. He has no rales.  Abdominal: Soft. Bowel sounds are normal. He exhibits no distension and no mass. There is no splenomegaly or hepatomegaly. There is no tenderness. There is no guarding.  Musculoskeletal: Normal range of motion.       Right lower leg: He exhibits no edema.       Left lower leg: He exhibits no edema.  Lymphadenopathy:    He has no cervical adenopathy.  Neurological: He is alert and oriented to person, place, and time.  Skin: Skin is warm and dry. No rash noted.  Vitals reviewed.   Lab Results  Component Value Date   WBC 8.6 08/07/2017   HGB 15.2 08/07/2017   HCT 44.3 08/07/2017   PLT 203.0 08/07/2017   GLUCOSE 211 (H) 08/07/2017   CHOL 118 08/07/2017   TRIG 172.0 (H) 08/07/2017   HDL 26.50 (L) 08/07/2017   LDLDIRECT 93.1 09/29/2012   LDLCALC 57 08/07/2017   ALT 28 08/07/2017    AST 25 08/07/2017   NA 139 08/07/2017   K 4.2 08/07/2017   CL 101 08/07/2017   CREATININE 1.58 (H) 08/07/2017   BUN 25 (H) 08/07/2017   CO2 27 08/07/2017   TSH 1.85 08/07/2017   PSA 1.87 03/31/2012   INR 1.20 09/21/2014   HGBA1C 6.4 08/07/2017   MICROALBUR 0.7 08/07/2017    Dg Chest 2 View  Result Date: 01/28/2017 CLINICAL DATA:  Cough and hoarseness EXAM: CHEST  2 VIEW COMPARISON:  April 02, 2016 FINDINGS: There is fibrotic change in lung bases. There is a calcified granuloma in the left lower lobe laterally. There is no edema or consolidation. Heart size is upper normal with pulmonary vascularity within normal limits. No adenopathy. There is aortic atherosclerosis. There is degenerative change in the thoracic spine. There is a stent in the left anterior descending coronary artery. IMPRESSION: Bibasilar lung fibrosis. Granuloma lateral left base. No edema or consolidation. Stable cardiac silhouette. There is aortic atherosclerosis. Aortic Atherosclerosis (ICD10-I70.0). Electronically Signed   By: Lowella Grip III M.D.   On: 01/28/2017 11:27    Assessment & Plan:   Ausar was seen today for diabetes, chest pain and gastroesophageal reflux.  Diagnoses and all orders for this visit:  Gastroesophageal reflux disease with esophagitis-his symptoms are suspicious for poorly treated GERD.  I have asked him to upgrade to a more potent PPI and to use Gaviscon with meals to help with the indigestion and belching. -     dexlansoprazole (DEXILANT) 60 MG capsule; Take 1 capsule (60 mg total) by mouth daily. -     Alum Hydroxide-Mag Carbonate (GAVISCON EXTRA STRENGTH) 160-105 MG CHEW; Chew 1 tablet by mouth 3 (three) times daily with meals as needed. -     CBC with Differential/Platelet; Future  Coronary artery disease involving native coronary artery of native heart, angina presence unspecified- He has suspicious symptoms of angina, mild changes in his EKG, but his troponin is negative.  I  have asked him to follow-up with his cardiologist about this. -     Ambulatory referral to Cardiology -     Troponin I; Future  Controlled type 2 diabetes mellitus with complication, without long-term current use of insulin (HCC)-his blood sugars are adequately well controlled. -     Hemoglobin A1c; Future -     Microalbumin / creatinine urine ratio; Future -     Comprehensive metabolic panel; Future  Chest tightness or pressure- As above -     EKG 12-Lead -     Ambulatory referral to Cardiology -     Troponin I; Future  Essential hypertension- His blood pressure is adequately well controlled. -     Urinalysis, Routine w reflex microscopic; Future -     Comprehensive metabolic panel; Future  Hyperlipidemia with target LDL less than 70- He has achieved his LDL goal and is doing well on the statin. -     Lipid panel; Future -     TSH; Future -     Comprehensive metabolic panel; Future   I have discontinued Keston F. Sharp's omeprazole. I am also having him start on dexlansoprazole and Alum Hydroxide-Mag Carbonate. Additionally, I am having him maintain his beta carotene w/minerals, aspirin, glucose blood, PRODIGY LANCETS 26G, Loratadine, KRILL OIL PO, timolol, nitroGLYCERIN, lisinopril, torsemide, alfuzosin, pravastatin, clonazePAM, carvedilol, metFORMIN, glimepiride, and finasteride.  Meds ordered this encounter  Medications  . dexlansoprazole (DEXILANT) 60 MG capsule    Sig: Take 1 capsule (60 mg total) by mouth daily.    Dispense:  90 capsule    Refill:  1  . Alum Hydroxide-Mag Carbonate (GAVISCON EXTRA STRENGTH) 160-105 MG CHEW    Sig: Chew 1 tablet by mouth 3 (three) times daily with meals as needed.    Dispense:  90 tablet    Refill:  5     Follow-up: Return in about 3 weeks (around 08/28/2017).  Scarlette Calico, MD

## 2017-08-07 NOTE — Patient Instructions (Signed)
Nonspecific Chest Pain °Chest pain can be caused by many different conditions. There is always a chance that your pain could be related to something serious, such as a heart attack or a blood clot in your lungs. Chest pain can also be caused by conditions that are not life-threatening. If you have chest pain, it is very important to follow up with your health care provider. °What are the causes? °Causes of this condition include: °· Heartburn. °· Pneumonia or bronchitis. °· Anxiety or stress. °· Inflammation around your heart (pericarditis) or lung (pleuritis or pleurisy). °· A blood clot in your lung. °· A collapsed lung (pneumothorax). This can develop suddenly on its own (spontaneous pneumothorax) or from trauma to the chest. °· Shingles infection (varicella-zoster virus). °· Heart attack. °· Damage to the bones, muscles, and cartilage that make up your chest wall. This can include: °? Bruised bones due to injury. °? Strained muscles or cartilage due to frequent or repeated coughing or overwork. °? Fracture to one or more ribs. °? Sore cartilage due to inflammation (costochondritis). ° °What increases the risk? °Risk factors for this condition may include: °· Activities that increase your risk for trauma or injury to your chest. °· Respiratory infections or conditions that cause frequent coughing. °· Medical conditions or overeating that can cause heartburn. °· Heart disease or family history of heart disease. °· Conditions or health behaviors that increase your risk of developing a blood clot. °· Having had chicken pox (varicella zoster). ° °What are the signs or symptoms? °Chest pain can feel like: °· Burning or tingling on the surface of your chest or deep in your chest. °· Crushing, pressure, aching, or squeezing pain. °· Dull or sharp pain that is worse when you move, cough, or take a deep breath. °· Pain that is also felt in your back, neck, shoulder, or arm, or pain that spreads to any of these  areas. ° °Your chest pain may come and go, or it may stay constant. °How is this diagnosed? °Lab tests or other studies may be needed to find the cause of your pain. Your health care provider may have you take a test called an ECG (electrocardiogram). An ECG records your heartbeat patterns at the time the test is performed. You may also have other tests, such as: °· Transthoracic echocardiogram (TTE). In this test, sound waves are used to create a picture of the heart structures and to look at how blood flows through your heart. °· Transesophageal echocardiogram (TEE). This is a more advanced imaging test that takes images from inside your body. It allows your health care provider to see your heart in finer detail. °· Cardiac monitoring. This allows your health care provider to monitor your heart rate and rhythm in real time. °· Holter monitor. This is a portable device that records your heartbeat and can help to diagnose abnormal heartbeats. It allows your health care provider to track your heart activity for several days, if needed. °· Stress tests. These can be done through exercise or by taking medicine that makes your heart beat more quickly. °· Blood tests. °· Other imaging tests. ° °How is this treated? °Treatment depends on what is causing your chest pain. Treatment may include: °· Medicines. These may include: °? Acid blockers for heartburn. °? Anti-inflammatory medicine. °? Pain medicine for inflammatory conditions. °? Antibiotic medicine, if an infection is present. °? Medicines to dissolve blood clots. °? Medicines to treat coronary artery disease (CAD). °· Supportive care for conditions that   do not require medicines. This may include: °? Resting. °? Applying heat or cold packs to injured areas. °? Limiting activities until pain decreases. ° °Follow these instructions at home: °Medicines °· If you were prescribed an antibiotic, take it as told by your health care provider. Do not stop taking the  antibiotic even if you start to feel better. °· Take over-the-counter and prescription medicines only as told by your health care provider. °Lifestyle °· Do not use any products that contain nicotine or tobacco, such as cigarettes and e-cigarettes. If you need help quitting, ask your health care provider. °· Do not drink alcohol. °· Make lifestyle changes as directed by your health care provider. These may include: °? Getting regular exercise. Ask your health care provider to suggest some activities that are safe for you. °? Eating a heart-healthy diet. A registered dietitian can help you to learn healthy eating options. °? Maintaining a healthy weight. °? Managing diabetes, if necessary. °? Reducing stress, such as with yoga or relaxation techniques. °General instructions °· Avoid any activities that bring on chest pain. °· If heartburn is the cause for your chest pain, raise (elevate) the head of your bed about 6 inches (15 cm) by putting blocks under the legs. Sleeping with more pillows does not effectively relieve heartburn because it only changes the position of your head. °· Keep all follow-up visits as told by your health care provider. This is important. This includes any further testing if your chest pain does not go away. °Contact a health care provider if: °· Your chest pain does not go away. °· You have a rash with blisters on your chest. °· You have a fever. °· You have chills. °Get help right away if: °· Your chest pain is worse. °· You have a cough that gets worse, or you cough up blood. °· You have severe pain in your abdomen. °· You have severe weakness. °· You faint. °· You have sudden, unexplained chest discomfort. °· You have sudden, unexplained discomfort in your arms, back, neck, or jaw. °· You have shortness of breath at any time. °· You suddenly start to sweat, or your skin gets clammy. °· You feel nauseous or you vomit. °· You suddenly feel light-headed or dizzy. °· Your heart begins to beat  quickly, or it feels like it is skipping beats. °These symptoms may represent a serious problem that is an emergency. Do not wait to see if the symptoms will go away. Get medical help right away. Call your local emergency services (911 in the [place] Do not drive yourself to the hospital. °This information is not intended to replace advice given to you by your health care provider. Make sure you discuss any questions you have with your health care provider. °Document Released: 11/14/2004 Document Revised: 10/30/2015 Document Reviewed: 10/30/2015 °Elsevier Interactive Patient Education © 2017 [employer] ° °

## 2017-08-24 ENCOUNTER — Other Ambulatory Visit: Payer: Self-pay | Admitting: Internal Medicine

## 2017-08-24 DIAGNOSIS — I2510 Atherosclerotic heart disease of native coronary artery without angina pectoris: Secondary | ICD-10-CM

## 2017-08-24 DIAGNOSIS — Z794 Long term (current) use of insulin: Secondary | ICD-10-CM

## 2017-08-24 DIAGNOSIS — I10 Essential (primary) hypertension: Secondary | ICD-10-CM

## 2017-08-24 DIAGNOSIS — R600 Localized edema: Secondary | ICD-10-CM

## 2017-08-24 DIAGNOSIS — E118 Type 2 diabetes mellitus with unspecified complications: Secondary | ICD-10-CM

## 2017-08-28 ENCOUNTER — Encounter: Payer: Self-pay | Admitting: Internal Medicine

## 2017-08-28 ENCOUNTER — Ambulatory Visit (INDEPENDENT_AMBULATORY_CARE_PROVIDER_SITE_OTHER): Payer: PPO | Admitting: Internal Medicine

## 2017-08-28 VITALS — BP 108/50 | HR 63 | Temp 97.6°F | Resp 16 | Ht 68.0 in | Wt 181.2 lb

## 2017-08-28 DIAGNOSIS — I10 Essential (primary) hypertension: Secondary | ICD-10-CM | POA: Diagnosis not present

## 2017-08-28 DIAGNOSIS — I2510 Atherosclerotic heart disease of native coronary artery without angina pectoris: Secondary | ICD-10-CM | POA: Diagnosis not present

## 2017-08-28 NOTE — Progress Notes (Signed)
Subjective:  Patient ID: Timothy Snyder, male    DOB: 1927-09-11  Age: 84 y.o. MRN: [id number]  CC: Osteoarthritis; Hypertension; and Diabetes   HPI [name] presents for f/up - He had a recent episode of atypical chest pain which I thought was related to heartburn.  He has been taking the PPI and fortunately, he tells me the chest pain has resolved.  He continues to have mild, slightly worsening DOE.  He complains of DJD pain in his hips and knees and mild weakness in his legs when he walks.  He denies any recent episodes of diaphoresis, chest pain, coughing, palpitations, dizziness, lightheadedness, or near syncope.  Outpatient Medications Prior to Visit  Medication Sig Dispense Refill  . alfuzosin (UROXATRAL) 10 MG 24 hr tablet TAKE 1 TABLET(10 MG) BY MOUTH DAILY WITH BREAKFAST 90 tablet 1  . Alum Hydroxide-Mag Carbonate (GAVISCON EXTRA STRENGTH) 160-105 MG CHEW Chew 1 tablet by mouth 3 (three) times daily with meals as needed. 90 tablet 5  . aspirin 81 MG tablet Take 81 mg by mouth daily.    . beta carotene w/minerals (OCUVITE) tablet Take 1 tablet by mouth at bedtime.     . carvedilol (COREG) 3.125 MG tablet TAKE 1 TABLET(3.125 MG) BY MOUTH TWICE DAILY WITH A MEAL 180 tablet 1  . clonazePAM (KLONOPIN) 0.5 MG tablet TAKE 1 TABLET BY MOUTH EVERY NIGHT AT BEDTIME 30 tablet 5  . dexlansoprazole (DEXILANT) 60 MG capsule Take 1 capsule (60 mg total) by mouth daily. 90 capsule 1  . finasteride (PROSCAR) 5 MG tablet TAKE 1 TABLET(5 MG) BY MOUTH DAILY 90 tablet 0  . glimepiride (AMARYL) 2 MG tablet Take 1 tablet (2 mg total) by mouth daily with breakfast. Must keep follow-up appt for future refills 90 tablet 0  . glucose blood (PRODIGY NO CODING BLOOD GLUC) test strip 1 each by Other route daily. Use as instructed 100 each 1  . KRILL OIL PO Take 1 tablet by mouth daily.    Marland Kitchen lisinopril (PRINIVIL,ZESTRIL) 2.5 MG tablet TAKE 1 TABLET(2.5 MG) BY MOUTH DAILY 90 tablet 0  . Loratadine 10 MG  CAPS Take 10 mg by mouth 2 (two) times daily.    . metFORMIN (GLUCOPHAGE-XR) 750 MG 24 hr tablet TAKE 1 TABLET(750 MG) BY MOUTH DAILY WITH BREAKFAST 90 tablet 1  . nitroGLYCERIN (NITROSTAT) 0.4 MG SL tablet Place 1 tablet (0.4 mg total) under the tongue every 5 (five) minutes x 3 doses as needed for chest pain. 25 tablet 11  . pravastatin (PRAVACHOL) 20 MG tablet TAKE 1 TABLET(20 MG) BY MOUTH EVERY EVENING 90 tablet 1  . PRODIGY LANCETS 26G MISC 1 each by Does not apply route daily. Dx code 250.00 100 each 1  . timolol (TIMOPTIC) 0.5 % ophthalmic solution Place 1 drop into both eyes 2 (two) times daily.  2  . torsemide (DEMADEX) 10 MG tablet TAKE 1 TABLET(10 MG) BY MOUTH DAILY 90 tablet 0   No facility-administered medications prior to visit.     ROS Review of Systems  Constitutional: Positive for fatigue. Negative for diaphoresis.  HENT: Negative.  Negative for sore throat and trouble swallowing.   Eyes: Negative for visual disturbance.  Respiratory: Positive for shortness of breath. Negative for cough, chest tightness and wheezing.   Cardiovascular: Negative for chest pain, palpitations and leg swelling.  Gastrointestinal: Negative for abdominal pain, constipation, diarrhea, nausea and vomiting.  Endocrine: Negative.   Genitourinary: Negative.  Negative for difficulty urinating and urgency.  Musculoskeletal: Positive for arthralgias. Negative for myalgias and neck pain.  Skin: Negative.  Negative for color change and pallor.  Allergic/Immunologic: Negative.   Neurological: Negative.  Negative for dizziness, weakness, light-headedness and headaches.  Hematological: Negative for adenopathy. Does not bruise/bleed easily.  Psychiatric/Behavioral: Negative.     Objective:  BP (!) 108/50 (BP Location: Left Arm, Patient Position: Sitting, Cuff Size: Normal)   Pulse 63   Temp 97.6 F (36.4 C) (Oral)   Resp 16   Ht 5\' 8"  (1.727 m)   Wt 181 lb 4 oz (82.2 kg)   SpO2 98%   BMI 27.56 kg/m    BP Readings from Last 3 Encounters:  08/28/17 (!) 108/50  08/07/17 116/70  02/06/17 (!) 112/58    Wt Readings from Last 3 Encounters:  08/28/17 181 lb 4 oz (82.2 kg)  08/07/17 183 lb (83 kg)  02/06/17 188 lb 12 oz (85.6 kg)    Physical Exam  Constitutional: He is oriented to person, place, and time. No distress.  HENT:  Mouth/Throat: No oropharyngeal exudate.  Eyes: Conjunctivae are normal. No scleral icterus.  Neck: Normal range of motion. Neck supple. No JVD present. No thyromegaly present.  Cardiovascular: Normal rate, regular rhythm and normal heart sounds. Exam reveals no gallop.  No murmur heard. Pulses:      Carotid pulses are 1+ on the right side, and 1+ on the left side.      Radial pulses are 1+ on the right side, and 1+ on the left side.       Femoral pulses are 1+ on the right side, and 1+ on the left side.      Popliteal pulses are 1+ on the right side, and 1+ on the left side.       Dorsalis pedis pulses are 1+ on the right side, and 1+ on the left side.       Posterior tibial pulses are 1+ on the right side, and 1+ on the left side.  Pulmonary/Chest: Effort normal and breath sounds normal. He has no wheezes. He has no rales.  Abdominal: Soft. Normal appearance and bowel sounds are normal. He exhibits no mass. There is no hepatosplenomegaly. There is no tenderness. No hernia.  Musculoskeletal: Normal range of motion. He exhibits no edema, tenderness or deformity.  Lymphadenopathy:    He has no cervical adenopathy.  Neurological: He is alert and oriented to person, place, and time.  Skin: Skin is warm and dry. No rash noted. He is not diaphoretic. No erythema.  Vitals reviewed.   Lab Results  Component Value Date   WBC 8.6 08/07/2017   HGB 15.2 08/07/2017   HCT 44.3 08/07/2017   PLT 203.0 08/07/2017   GLUCOSE 211 (H) 08/07/2017   CHOL 118 08/07/2017   TRIG 172.0 (H) 08/07/2017   HDL 26.50 (L) 08/07/2017   LDLDIRECT 93.1 09/29/2012   LDLCALC 57  08/07/2017   ALT 28 08/07/2017   AST 25 08/07/2017   NA 139 08/07/2017   K 4.2 08/07/2017   CL 101 08/07/2017   CREATININE 1.58 (H) 08/07/2017   BUN 25 (H) 08/07/2017   CO2 27 08/07/2017   TSH 1.85 08/07/2017   PSA 1.87 03/31/2012   INR 1.20 09/21/2014   HGBA1C 6.4 08/07/2017   MICROALBUR 0.7 08/07/2017    Dg Chest 2 View  Result Date: 01/28/2017 CLINICAL DATA:  Cough and hoarseness EXAM: CHEST  2 VIEW COMPARISON:  April 02, 2016 FINDINGS: There is fibrotic change in lung bases.  There is a calcified granuloma in the left lower lobe laterally. There is no edema or consolidation. Heart size is upper normal with pulmonary vascularity within normal limits. No adenopathy. There is aortic atherosclerosis. There is degenerative change in the thoracic spine. There is a stent in the left anterior descending coronary artery. IMPRESSION: Bibasilar lung fibrosis. Granuloma lateral left base. No edema or consolidation. Stable cardiac silhouette. There is aortic atherosclerosis. Aortic Atherosclerosis (ICD10-I70.0). Electronically Signed   By: Lowella Grip III M.D.   On: 01/28/2017 11:27    Assessment & Plan:   William was seen today for osteoarthritis, hypertension and diabetes.  Diagnoses and all orders for this visit:  Coronary artery disease involving native coronary artery of native heart without angina pectoris- I am not certain that his symptoms are related to CAD.  Nonetheless, he has an appointment with cardiology next week to go over this.  Essential hypertension- His blood pressure is adequately well controlled.   I have discontinued Butch F. Pixler's torsemide. I am also having him maintain his beta carotene w/minerals, aspirin, glucose blood, PRODIGY LANCETS 26G, Loratadine, KRILL OIL PO, timolol, nitroGLYCERIN, alfuzosin, pravastatin, clonazePAM, carvedilol, metFORMIN, glimepiride, finasteride, dexlansoprazole, Alum Hydroxide-Mag Carbonate, and lisinopril.  No orders of  the defined types were placed in this encounter.    Follow-up: Return in about 4 months (around 12/29/2017).  Scarlette Calico, MD

## 2017-08-28 NOTE — Patient Instructions (Signed)

## 2017-08-30 NOTE — Progress Notes (Signed)
Cardiology Office Note   Date:  09/01/2017   ID:  Timothy Snyder, DOB [birth year], MRN [id number]  PCP:  Janith Lima, MD  Cardiologist:  Dr. Martinique  Chief Complaint  Patient presents with  . office visit    seen PCP stated EKG looked different, denies chest pains, denies SOB, denies swelling in hands/feet     History of Present Illness: Timothy Snyder is a 84 y.o. male who presents for ongoing assessment and management of coronary artery disease, with history of non-STEMI in August 2016 requiring a synergy drug-eluting stent to a 99% mid LAD lesion with other history to include type 2 diabetes, hypertension, hyperlipidemia, chronic kidney disease stage III, and macular degeneration causing blindness.  He was last seen by Dr. Martinique on 11/18/2016, at that time he was having "twinges of chest pain", and he was continued on current medication regimen to include aspirin beta-blocker and statin therapy.  He comes today for follow up after seeing Dr. Ronnald Ramp, his PCP. On that visit, torsemide was discontinued. Creatinine was found to be 1.58. The patient denies fluid retention, dyspnea, but has some complaints of generalized fatigue. He has not been exercising as he used to by riding a stationary bike. He had to move out of his apartment due to some water damage and got out of the habit.    He complains about right leg pain which has become worse over the last several months. He states he was told it was arthritis.    Past Medical History:  Diagnosis Date  . Actinic keratosis   . ARTHRITIS   . BPH (benign prostatic hyperplasia)   . CAD (coronary artery disease)    cath 09/21/2014 1v dx, 99% mid LAD treated with DES, 50% prox to mid LAD.  Marland Kitchen DIABETES MELLITUS, TYPE II   . GERD (gastroesophageal reflux disease)   . GLUCOMA   . HYPERTENSION   . Macular degeneration    L>R vision loss, legally blind  . Restless leg syndrome     Past Surgical History:  Procedure Laterality Date  .  CARDIAC CATHETERIZATION N/A 09/21/2014   Procedure: Left Heart Cath and Coronary Angiography;  Surgeon: Peter M Martinique, MD;  Location: [facility];  Service: Cardiovascular;  Laterality: N/A;  . CARDIAC CATHETERIZATION  09/21/2014   Procedure: Coronary Stent Intervention;  Surgeon: Peter M Martinique, MD;  Location: [facility];  Service: Cardiovascular;;  . NO PAST SURGERIES       Current Outpatient Medications  Medication Sig Dispense Refill  . alfuzosin (UROXATRAL) 10 MG 24 hr tablet TAKE 1 TABLET(10 MG) BY MOUTH DAILY WITH BREAKFAST 90 tablet 1  . Alum Hydroxide-Mag Carbonate (GAVISCON EXTRA STRENGTH) 160-105 MG CHEW Chew 1 tablet by mouth 3 (three) times daily with meals as needed. 90 tablet 5  . aspirin 81 MG tablet Take 81 mg by mouth daily.    . beta carotene w/minerals (OCUVITE) tablet Take 1 tablet by mouth at bedtime.     . carvedilol (COREG) 3.125 MG tablet TAKE 1 TABLET(3.125 MG) BY MOUTH TWICE DAILY WITH A MEAL 180 tablet 1  . clonazePAM (KLONOPIN) 0.5 MG tablet TAKE 1 TABLET BY MOUTH EVERY NIGHT AT BEDTIME 30 tablet 5  . dexlansoprazole (DEXILANT) 60 MG capsule Take 1 capsule (60 mg total) by mouth daily. 90 capsule 1  . finasteride (PROSCAR) 5 MG tablet TAKE 1 TABLET(5 MG) BY MOUTH DAILY 90 tablet 0  . glimepiride (AMARYL) 2 MG tablet Take 1  tablet (2 mg total) by mouth daily with breakfast. Must keep follow-up appt for future refills 90 tablet 0  . glucose blood (PRODIGY NO CODING BLOOD GLUC) test strip 1 each by Other route daily. Use as instructed 100 each 1  . KRILL OIL PO Take 1 tablet by mouth daily.    Marland Kitchen lisinopril (PRINIVIL,ZESTRIL) 2.5 MG tablet TAKE 1 TABLET(2.5 MG) BY MOUTH DAILY 90 tablet 0  . Loratadine 10 MG CAPS Take 10 mg by mouth 2 (two) times daily.    . metFORMIN (GLUCOPHAGE-XR) 750 MG 24 hr tablet TAKE 1 TABLET(750 MG) BY MOUTH DAILY WITH BREAKFAST 90 tablet 1  . nitroGLYCERIN (NITROSTAT) 0.4 MG SL tablet Place 1 tablet (0.4 mg total) under the tongue  every 5 (five) minutes x 3 doses as needed for chest pain. 25 tablet 11  . pravastatin (PRAVACHOL) 20 MG tablet TAKE 1 TABLET(20 MG) BY MOUTH EVERY EVENING 90 tablet 1  . PRODIGY LANCETS 26G MISC 1 each by Does not apply route daily. Dx code 250.00 100 each 1  . timolol (TIMOPTIC) 0.5 % ophthalmic solution Place 1 drop into both eyes 2 (two) times daily.  2   No current facility-administered medications for this visit.     Allergies:   Brinzolamide-brimonidine and Other    Social History:  The patient  reports that he has never smoked. He has never used smokeless tobacco. He reports that he does not drink alcohol or use drugs.   Family History:  The patient's family history includes Cancer in his father; Coronary artery disease (age of onset: 40) in his mother; Heart failure in his mother.    ROS: All other systems are reviewed and negative. Unless otherwise mentioned in H&P    PHYSICAL EXAM: VS:  BP 120/60 (BP Location: Left Arm, Patient Position: Sitting)   Pulse 68   Ht 5\' 8"  (1.727 m)   Wt 182 lb (82.6 kg)   SpO2 98%   BMI 27.67 kg/m  , BMI Body mass index is 27.67 kg/m. GEN: Well nourished, well developed, in no acute distress  HEENT: normal He does not track due to blindness  Neck: no JVD, carotid bruits, or masses Cardiac: RRR; soft systolic murmurs, rubs, or gallops,no edema DP is diminished bilaterally.  Respiratory:  clear to auscultation bilaterally, normal work of breathing GI: soft, nontender, nondistended, + BS MS: no deformity or atrophy  Skin: warm and dry, no rash Neuro:  Strength and sensation are intact Psych: euthymic mood, full affect   EKG: I have reviewed EKG from recent visit with Dr. Ronnald Ramp, this reveals NSR with 1st degree block,  HR  71 bpm unchanged from previous EKG 09/20/2014.   Recent Labs: 08/07/2017: ALT 28; BUN 25; Creatinine, Ser 1.58; Hemoglobin 15.2; Platelets 203.0; Potassium 4.2; Sodium 139; TSH 1.85    Lipid Panel    Component Value  Date/Time   CHOL 118 08/07/2017 0926   TRIG 172.0 (H) 08/07/2017 0926   TRIG 171 12/23/2008   HDL 26.50 (L) 08/07/2017 0926   CHOLHDL 4 08/07/2017 0926   VLDL 34.4 08/07/2017 0926   LDLCALC 57 08/07/2017 0926   LDLDIRECT 93.1 09/29/2012 0930      Wt Readings from Last 3 Encounters:  09/01/17 182 lb (82.6 kg)  08/28/17 181 lb 4 oz (82.2 kg)  08/07/17 183 lb (83 kg)      Other studies Reviewed: Left ventricle: The cavity size was normal. Systolic function was   normal. The estimated ejection fraction was  in the range of 55%   to 60%. Mild hypokinesis of the apical myocardium. Features are   consistent with a pseudonormal left ventricular filling pattern,   with concomitant abnormal relaxation and increased filling   pressure (grade 2 diastolic dysfunction). - Left atrium: The atrium was moderately to severely dilated.  ASSESSMENT AND PLAN:  1. CAD: He offers no new complaints from cardiology standpoint. He has some fatigue but has gotten out of the habit of daily exercise. He will continue current medication regimen for now with ASA 81 mg and lisinopril 2.5 mg. No plans for ischemic testing. Check BMET off of torsemide.   2. Leg Pain: Due to history of CAD, diabetes, may need further evaluation for PAD. He is normally very active but legs have been bothering him. I have advised that we check ABI's for better evaluation as his pulses are diminished. He would like to wait until he sees Dr. Martinique in October to evaluate further. I will have ABI's checked just before that appointment. He may benefit from Pletal.   3. Hypercholesterolemia; Continue statin therapy.   4. Diabetes: Followed by Dr. Ronnald Ramp.    Current medicines are reviewed at length with the patient today.    Labs/ tests ordered today include: ABI's and BMET.  Phill Myron. West Pugh, ANP, AACC   09/01/2017 3:18 PM    [facility] [address], [place], [place] [address] Phone: [phone]; Fax: [phone]

## 2017-09-01 ENCOUNTER — Ambulatory Visit: Payer: PPO | Admitting: Adult Health

## 2017-09-01 ENCOUNTER — Encounter: Payer: Self-pay | Admitting: Adult Health

## 2017-09-01 ENCOUNTER — Encounter

## 2017-09-01 VITALS — BP 120/60 | HR 68 | Ht 68.0 in | Wt 182.0 lb

## 2017-09-01 DIAGNOSIS — E119 Type 2 diabetes mellitus without complications: Secondary | ICD-10-CM | POA: Diagnosis not present

## 2017-09-01 DIAGNOSIS — M79604 Pain in right leg: Secondary | ICD-10-CM | POA: Diagnosis not present

## 2017-09-01 DIAGNOSIS — Z794 Long term (current) use of insulin: Secondary | ICD-10-CM | POA: Diagnosis not present

## 2017-09-01 DIAGNOSIS — I10 Essential (primary) hypertension: Secondary | ICD-10-CM

## 2017-09-01 DIAGNOSIS — I2510 Atherosclerotic heart disease of native coronary artery without angina pectoris: Secondary | ICD-10-CM

## 2017-09-01 NOTE — Patient Instructions (Signed)
Medication Instructions:  NO CHANGES- Your physician recommends that you continue on your current medications as directed. Please refer to the Current Medication list given to you today.  If you need a refill on your cardiac medications before your next appointment, please call your pharmacy.  Testing/Procedures: Your physician has requested that you have an ankle brachial index (ABI)SCHEDULE END OF September. During this test an ultrasound and blood pressure cuff are used to evaluate the arteries that supply the arms and legs with blood. Allow thirty minutes for this exam. There are no restrictions or special instructions.  Follow-Up: Your physician wants you to follow-up in: October AS PLANNED. You should receive a reminder letter in the mail two months in advance. If you do not receive a letter, please call our office AUGUST 2019 to schedule the October 2019 follow-up appointment.   Thank you for choosing [facility] at [facility]!!

## 2017-09-09 ENCOUNTER — Other Ambulatory Visit: Payer: Self-pay | Admitting: Internal Medicine

## 2017-09-09 DIAGNOSIS — E785 Hyperlipidemia, unspecified: Secondary | ICD-10-CM

## 2017-09-09 DIAGNOSIS — N400 Enlarged prostate without lower urinary tract symptoms: Secondary | ICD-10-CM

## 2017-09-09 DIAGNOSIS — I2583 Coronary atherosclerosis due to lipid rich plaque: Principal | ICD-10-CM

## 2017-09-09 DIAGNOSIS — I2510 Atherosclerotic heart disease of native coronary artery without angina pectoris: Secondary | ICD-10-CM

## 2017-09-09 DIAGNOSIS — E118 Type 2 diabetes mellitus with unspecified complications: Secondary | ICD-10-CM

## 2017-09-10 ENCOUNTER — Other Ambulatory Visit: Payer: Self-pay | Admitting: Adult Health

## 2017-09-10 DIAGNOSIS — R0989 Other specified symptoms and signs involving the circulatory and respiratory systems: Secondary | ICD-10-CM

## 2017-09-12 ENCOUNTER — Encounter: Payer: Self-pay | Admitting: Internal Medicine

## 2017-09-15 ENCOUNTER — Other Ambulatory Visit: Payer: Self-pay | Admitting: Internal Medicine

## 2017-09-15 DIAGNOSIS — F0390 Unspecified dementia without behavioral disturbance: Secondary | ICD-10-CM | POA: Insufficient documentation

## 2017-09-15 DIAGNOSIS — I2510 Atherosclerotic heart disease of native coronary artery without angina pectoris: Secondary | ICD-10-CM

## 2017-09-15 DIAGNOSIS — I10 Essential (primary) hypertension: Secondary | ICD-10-CM

## 2017-09-18 DIAGNOSIS — E1122 Type 2 diabetes mellitus with diabetic chronic kidney disease: Secondary | ICD-10-CM | POA: Diagnosis not present

## 2017-09-18 DIAGNOSIS — I129 Hypertensive chronic kidney disease with stage 1 through stage 4 chronic kidney disease, or unspecified chronic kidney disease: Secondary | ICD-10-CM | POA: Diagnosis not present

## 2017-09-18 DIAGNOSIS — M79661 Pain in right lower leg: Secondary | ICD-10-CM | POA: Diagnosis not present

## 2017-09-18 DIAGNOSIS — I2510 Atherosclerotic heart disease of native coronary artery without angina pectoris: Secondary | ICD-10-CM | POA: Diagnosis not present

## 2017-09-18 DIAGNOSIS — M79662 Pain in left lower leg: Secondary | ICD-10-CM | POA: Diagnosis not present

## 2017-09-22 ENCOUNTER — Other Ambulatory Visit: Payer: Self-pay | Admitting: Internal Medicine

## 2017-09-22 ENCOUNTER — Telehealth: Payer: Self-pay | Admitting: Internal Medicine

## 2017-09-22 NOTE — Telephone Encounter (Signed)
Copied from [place] [phone]. Topic: General - Other >> Sep 22, 2017  8:12 AM Yvette Rack wrote: Reason for CRM: Nurse Jeb Levering from [place] [phone]  calling for verbal orders for skill nursing for DM management and BP monitor for  1 time a week for 2 weeks  1 time every other week for 3 weeks

## 2017-09-23 NOTE — Telephone Encounter (Signed)
Earnest Bailey called to check the status on receiving verbal orders. Please call Holly at [phone].

## 2017-09-23 NOTE — Telephone Encounter (Signed)
Contacted Holly and gave verbal okay for SN for DM and BP monitoring as needed.

## 2017-09-30 ENCOUNTER — Other Ambulatory Visit: Payer: Self-pay | Admitting: Internal Medicine

## 2017-09-30 DIAGNOSIS — I252 Old myocardial infarction: Secondary | ICD-10-CM

## 2017-09-30 DIAGNOSIS — N183 Chronic kidney disease, stage 3 (moderate): Secondary | ICD-10-CM | POA: Diagnosis not present

## 2017-09-30 DIAGNOSIS — H3530 Unspecified macular degeneration: Secondary | ICD-10-CM | POA: Diagnosis not present

## 2017-09-30 DIAGNOSIS — H548 Legal blindness, as defined in USA: Secondary | ICD-10-CM

## 2017-09-30 DIAGNOSIS — N400 Enlarged prostate without lower urinary tract symptoms: Secondary | ICD-10-CM | POA: Diagnosis not present

## 2017-09-30 DIAGNOSIS — M79662 Pain in left lower leg: Secondary | ICD-10-CM | POA: Diagnosis not present

## 2017-09-30 DIAGNOSIS — M1991 Primary osteoarthritis, unspecified site: Secondary | ICD-10-CM

## 2017-09-30 DIAGNOSIS — M79661 Pain in right lower leg: Secondary | ICD-10-CM | POA: Diagnosis not present

## 2017-09-30 DIAGNOSIS — H409 Unspecified glaucoma: Secondary | ICD-10-CM

## 2017-09-30 DIAGNOSIS — E1122 Type 2 diabetes mellitus with diabetic chronic kidney disease: Secondary | ICD-10-CM | POA: Diagnosis not present

## 2017-09-30 DIAGNOSIS — E785 Hyperlipidemia, unspecified: Secondary | ICD-10-CM

## 2017-09-30 DIAGNOSIS — G2581 Restless legs syndrome: Secondary | ICD-10-CM

## 2017-09-30 DIAGNOSIS — Z9181 History of falling: Secondary | ICD-10-CM

## 2017-09-30 DIAGNOSIS — F0390 Unspecified dementia without behavioral disturbance: Secondary | ICD-10-CM | POA: Diagnosis not present

## 2017-09-30 DIAGNOSIS — I2510 Atherosclerotic heart disease of native coronary artery without angina pectoris: Secondary | ICD-10-CM | POA: Diagnosis not present

## 2017-09-30 DIAGNOSIS — I129 Hypertensive chronic kidney disease with stage 1 through stage 4 chronic kidney disease, or unspecified chronic kidney disease: Secondary | ICD-10-CM | POA: Diagnosis not present

## 2017-09-30 DIAGNOSIS — Z7984 Long term (current) use of oral hypoglycemic drugs: Secondary | ICD-10-CM

## 2017-09-30 DIAGNOSIS — H9193 Unspecified hearing loss, bilateral: Secondary | ICD-10-CM

## 2017-10-13 ENCOUNTER — Other Ambulatory Visit: Payer: Self-pay | Admitting: Internal Medicine

## 2017-10-21 DIAGNOSIS — I2510 Atherosclerotic heart disease of native coronary artery without angina pectoris: Secondary | ICD-10-CM | POA: Diagnosis not present

## 2017-10-21 DIAGNOSIS — M79662 Pain in left lower leg: Secondary | ICD-10-CM | POA: Diagnosis not present

## 2017-10-21 DIAGNOSIS — I129 Hypertensive chronic kidney disease with stage 1 through stage 4 chronic kidney disease, or unspecified chronic kidney disease: Secondary | ICD-10-CM | POA: Diagnosis not present

## 2017-10-21 DIAGNOSIS — E1122 Type 2 diabetes mellitus with diabetic chronic kidney disease: Secondary | ICD-10-CM | POA: Diagnosis not present

## 2017-10-21 DIAGNOSIS — M79661 Pain in right lower leg: Secondary | ICD-10-CM | POA: Diagnosis not present

## 2017-10-22 ENCOUNTER — Other Ambulatory Visit: Payer: Self-pay | Admitting: Internal Medicine

## 2017-10-24 ENCOUNTER — Encounter: Payer: Self-pay | Admitting: Internal Medicine

## 2017-10-24 ENCOUNTER — Other Ambulatory Visit: Payer: Self-pay | Admitting: Internal Medicine

## 2017-11-11 ENCOUNTER — Inpatient Hospital Stay (HOSPITAL_COMMUNITY): Admission: RE | Admit: 2017-11-11 | Payer: PPO | Source: Ambulatory Visit

## 2017-11-13 DIAGNOSIS — E1122 Type 2 diabetes mellitus with diabetic chronic kidney disease: Secondary | ICD-10-CM | POA: Diagnosis not present

## 2017-11-13 DIAGNOSIS — M79661 Pain in right lower leg: Secondary | ICD-10-CM | POA: Diagnosis not present

## 2017-11-13 DIAGNOSIS — I2510 Atherosclerotic heart disease of native coronary artery without angina pectoris: Secondary | ICD-10-CM | POA: Diagnosis not present

## 2017-11-13 DIAGNOSIS — I129 Hypertensive chronic kidney disease with stage 1 through stage 4 chronic kidney disease, or unspecified chronic kidney disease: Secondary | ICD-10-CM | POA: Diagnosis not present

## 2017-11-13 DIAGNOSIS — M79662 Pain in left lower leg: Secondary | ICD-10-CM | POA: Diagnosis not present

## 2017-11-17 ENCOUNTER — Encounter (HOSPITAL_COMMUNITY): Payer: PPO

## 2017-11-17 ENCOUNTER — Other Ambulatory Visit: Payer: Self-pay | Admitting: Internal Medicine

## 2017-11-17 DIAGNOSIS — E118 Type 2 diabetes mellitus with unspecified complications: Secondary | ICD-10-CM

## 2017-11-17 DIAGNOSIS — I10 Essential (primary) hypertension: Secondary | ICD-10-CM

## 2017-11-17 DIAGNOSIS — I2510 Atherosclerotic heart disease of native coronary artery without angina pectoris: Secondary | ICD-10-CM

## 2017-11-17 DIAGNOSIS — R600 Localized edema: Secondary | ICD-10-CM

## 2017-11-17 DIAGNOSIS — Z794 Long term (current) use of insulin: Secondary | ICD-10-CM

## 2017-12-02 ENCOUNTER — Other Ambulatory Visit: Payer: Self-pay | Admitting: Internal Medicine

## 2017-12-02 DIAGNOSIS — I10 Essential (primary) hypertension: Secondary | ICD-10-CM

## 2017-12-02 DIAGNOSIS — E118 Type 2 diabetes mellitus with unspecified complications: Secondary | ICD-10-CM

## 2017-12-02 DIAGNOSIS — I214 Non-ST elevation (NSTEMI) myocardial infarction: Secondary | ICD-10-CM

## 2017-12-02 DIAGNOSIS — I2510 Atherosclerotic heart disease of native coronary artery without angina pectoris: Secondary | ICD-10-CM

## 2017-12-05 ENCOUNTER — Encounter: Payer: Self-pay | Admitting: Internal Medicine

## 2017-12-05 NOTE — Telephone Encounter (Signed)
Med not on med list pls advise on refill../lmb 

## 2017-12-06 ENCOUNTER — Other Ambulatory Visit: Payer: Self-pay | Admitting: Internal Medicine

## 2017-12-08 ENCOUNTER — Other Ambulatory Visit: Payer: Self-pay | Admitting: Internal Medicine

## 2017-12-08 DIAGNOSIS — R600 Localized edema: Secondary | ICD-10-CM

## 2017-12-08 DIAGNOSIS — I10 Essential (primary) hypertension: Secondary | ICD-10-CM

## 2017-12-08 MED ORDER — TORSEMIDE 10 MG PO TABS: 10.0000 mg | ORAL_TABLET | Freq: Every day | ORAL | 1 refills | Status: DC

## 2017-12-11 DIAGNOSIS — Z808 Family history of malignant neoplasm of other organs or systems: Secondary | ICD-10-CM | POA: Diagnosis not present

## 2017-12-11 DIAGNOSIS — Z85828 Personal history of other malignant neoplasm of skin: Secondary | ICD-10-CM | POA: Diagnosis not present

## 2017-12-11 DIAGNOSIS — Z23 Encounter for immunization: Secondary | ICD-10-CM | POA: Diagnosis not present

## 2017-12-11 DIAGNOSIS — L570 Actinic keratosis: Secondary | ICD-10-CM | POA: Diagnosis not present

## 2017-12-11 DIAGNOSIS — D2272 Melanocytic nevi of left lower limb, including hip: Secondary | ICD-10-CM | POA: Diagnosis not present

## 2017-12-11 DIAGNOSIS — D485 Neoplasm of uncertain behavior of skin: Secondary | ICD-10-CM | POA: Diagnosis not present

## 2017-12-11 DIAGNOSIS — L821 Other seborrheic keratosis: Secondary | ICD-10-CM | POA: Diagnosis not present

## 2017-12-11 DIAGNOSIS — C44319 Basal cell carcinoma of skin of other parts of face: Secondary | ICD-10-CM | POA: Diagnosis not present

## 2017-12-29 ENCOUNTER — Encounter: Payer: Self-pay | Admitting: Internal Medicine

## 2017-12-29 ENCOUNTER — Other Ambulatory Visit (INDEPENDENT_AMBULATORY_CARE_PROVIDER_SITE_OTHER): Payer: PPO

## 2017-12-29 ENCOUNTER — Ambulatory Visit (INDEPENDENT_AMBULATORY_CARE_PROVIDER_SITE_OTHER): Payer: PPO | Admitting: Internal Medicine

## 2017-12-29 VITALS — BP 118/54 | HR 66 | Temp 97.5°F | Resp 16 | Ht 68.0 in | Wt 183.8 lb

## 2017-12-29 DIAGNOSIS — E118 Type 2 diabetes mellitus with unspecified complications: Secondary | ICD-10-CM

## 2017-12-29 DIAGNOSIS — I10 Essential (primary) hypertension: Secondary | ICD-10-CM | POA: Diagnosis not present

## 2017-12-29 LAB — BASIC METABOLIC PANEL
BUN: 26 mg/dL — ABNORMAL HIGH (ref 6–23)
CHLORIDE: 101 meq/L (ref 96–112)
CO2: 28 mEq/L (ref 19–32)
CREATININE: 1.66 mg/dL — AB (ref 0.40–1.50)
Calcium: 8.8 mg/dL (ref 8.4–10.5)
GFR: 41.72 mL/min — AB (ref >60.00)
Glucose, Bld: 168 mg/dL — ABNORMAL HIGH (ref 70–99)
Potassium: 4.2 mEq/L (ref 3.5–5.1)
Sodium: 138 mEq/L (ref 135–145)

## 2017-12-29 LAB — HEMOGLOBIN A1C: Hgb A1c MFr Bld: 5.9 % (ref 4.6–6.5)

## 2017-12-29 NOTE — Progress Notes (Signed)
Subjective:  Patient ID: Timothy Snyder, male    DOB: [birth year]  Age: 84 y.o. MRN: [id number]  CC: Hypertension and Diabetes   HPI JAKYRIE TOTHEROW presents for f/up - He feels well today and offers no complaints.  He tells me his blood pressure and blood sugars have been well controlled.  Outpatient Medications Prior to Visit  Medication Sig Dispense Refill  . alfuzosin (UROXATRAL) 10 MG 24 hr tablet TAKE 1 TABLET(10 MG) BY MOUTH DAILY WITH BREAKFAST 90 tablet 1  . Alum Hydroxide-Mag Carbonate (GAVISCON EXTRA STRENGTH) 160-105 MG CHEW Chew 1 tablet by mouth 3 (three) times daily with meals as needed. 90 tablet 5  . aspirin 81 MG tablet Take 81 mg by mouth daily.    . beta carotene w/minerals (OCUVITE) tablet Take 1 tablet by mouth at bedtime.     . carvedilol (COREG) 3.125 MG tablet TAKE 1 TABLET(3.125 MG) BY MOUTH TWICE DAILY WITH A MEAL 180 tablet 0  . clonazePAM (KLONOPIN) 0.5 MG tablet TAKE 1 TABLET BY MOUTH EVERY NIGHT AT BEDTIME 30 tablet 5  . dexlansoprazole (DEXILANT) 60 MG capsule Take 1 capsule (60 mg total) by mouth daily. 90 capsule 1  . finasteride (PROSCAR) 5 MG tablet TAKE 1 TABLET(5 MG) BY MOUTH DAILY 90 tablet 1  . glucose blood (PRODIGY NO CODING BLOOD GLUC) test strip 1 each by Other route daily. Use as instructed 100 each 1  . KRILL OIL PO Take 1 tablet by mouth daily.    Marland Kitchen lisinopril (PRINIVIL,ZESTRIL) 2.5 MG tablet Take 1 tablet (2.5 mg total) by mouth daily. 90 tablet 1  . Loratadine 10 MG CAPS Take 10 mg by mouth 2 (two) times daily.    . metFORMIN (GLUCOPHAGE-XR) 750 MG 24 hr tablet TAKE 1 TABLET(750 MG) BY MOUTH DAILY WITH BREAKFAST 90 tablet 0  . nitroGLYCERIN (NITROSTAT) 0.4 MG SL tablet Place 1 tablet (0.4 mg total) under the tongue every 5 (five) minutes x 3 doses as needed for chest pain. 25 tablet 11  . pravastatin (PRAVACHOL) 20 MG tablet TAKE 1 TABLET(20 MG) BY MOUTH EVERY EVENING 90 tablet 1  . PRODIGY LANCETS 26G MISC 1 each by Does not apply route  daily. Dx code 250.00 100 each 1  . timolol (TIMOPTIC) 0.5 % ophthalmic solution Place 1 drop into both eyes 2 (two) times daily.  2  . torsemide (DEMADEX) 10 MG tablet Take 1 tablet (10 mg total) by mouth daily. 90 tablet 1  . glimepiride (AMARYL) 2 MG tablet Take 1 tablet (2 mg total) by mouth daily with breakfast. 90 tablet 1   No facility-administered medications prior to visit.     ROS Review of Systems  Constitutional: Negative.  Negative for appetite change, diaphoresis, fatigue and fever.  HENT: Negative.   Respiratory: Negative for cough, chest tightness, shortness of breath and wheezing.   Cardiovascular: Negative for chest pain, palpitations and leg swelling.  Gastrointestinal: Negative for abdominal pain, constipation, diarrhea, nausea and vomiting.  Endocrine: Negative for polydipsia and polyphagia.  Genitourinary: Negative.  Negative for difficulty urinating.  Musculoskeletal: Negative.  Negative for arthralgias and myalgias.  Skin: Negative.   Neurological: Negative.  Negative for dizziness, weakness and light-headedness.  Hematological: Negative for adenopathy. Does not bruise/bleed easily.  Psychiatric/Behavioral: Negative.     Objective:  BP (!) 118/54 (BP Location: Left Arm, Patient Position: Sitting, Cuff Size: Normal)   Pulse 66   Temp (!) 97.5 F (36.4 C) (Oral)   Resp 16  Ht 5\' 8"  (1.727 m)   Wt 183 lb 12 oz (83.3 kg)   SpO2 97%   BMI 27.94 kg/m   BP Readings from Last 3 Encounters:  12/29/17 (!) 118/54  09/01/17 120/60  08/28/17 (!) 108/50    Wt Readings from Last 3 Encounters:  12/29/17 183 lb 12 oz (83.3 kg)  09/01/17 182 lb (82.6 kg)  08/28/17 181 lb 4 oz (82.2 kg)    Physical Exam  Constitutional: He is oriented to person, place, and time. No distress.  HENT:  Mouth/Throat: Oropharynx is clear and moist. No oropharyngeal exudate.  Eyes: Conjunctivae are normal. No scleral icterus.  Neck: Normal range of motion. Neck supple. No JVD  present. No thyromegaly present.  Cardiovascular: Normal rate, regular rhythm and normal heart sounds. Exam reveals no gallop.  No murmur heard. Pulmonary/Chest: Effort normal and breath sounds normal. He has no wheezes. He has no rales.  Abdominal: Soft. Bowel sounds are normal. He exhibits no mass. There is no tenderness.  Musculoskeletal: Normal range of motion. He exhibits no edema, tenderness or deformity.  Lymphadenopathy:    He has no cervical adenopathy.  Neurological: He is alert and oriented to person, place, and time.  Skin: Skin is warm and dry. No rash noted. He is not diaphoretic.  Vitals reviewed.   Lab Results  Component Value Date   WBC 8.6 08/07/2017   HGB 15.2 08/07/2017   HCT 44.3 08/07/2017   PLT 203.0 08/07/2017   GLUCOSE 168 (H) 12/29/2017   CHOL 118 08/07/2017   TRIG 172.0 (H) 08/07/2017   HDL 26.50 (L) 08/07/2017   LDLDIRECT 93.1 09/29/2012   LDLCALC 57 08/07/2017   ALT 28 08/07/2017   AST 25 08/07/2017   NA 138 12/29/2017   K 4.2 12/29/2017   CL 101 12/29/2017   CREATININE 1.66 (H) 12/29/2017   BUN 26 (H) 12/29/2017   CO2 28 12/29/2017   TSH 1.85 08/07/2017   PSA 1.87 03/31/2012   INR 1.20 09/21/2014   HGBA1C 5.9 12/29/2017   MICROALBUR 0.7 08/07/2017    Dg Chest 2 View  Result Date: 01/28/2017 CLINICAL DATA:  Cough and hoarseness EXAM: CHEST  2 VIEW COMPARISON:  April 02, 2016 FINDINGS: There is fibrotic change in lung bases. There is a calcified granuloma in the left lower lobe laterally. There is no edema or consolidation. Heart size is upper normal with pulmonary vascularity within normal limits. No adenopathy. There is aortic atherosclerosis. There is degenerative change in the thoracic spine. There is a stent in the left anterior descending coronary artery. IMPRESSION: Bibasilar lung fibrosis. Granuloma lateral left base. No edema or consolidation. Stable cardiac silhouette. There is aortic atherosclerosis. Aortic Atherosclerosis  (ICD10-I70.0). Electronically Signed   By: Lowella Grip III M.D.   On: 01/28/2017 11:27    Assessment & Plan:   Khan was seen today for hypertension and diabetes.  Diagnoses and all orders for this visit:  Essential hypertension - His blood pressure is well controlled.  Electrolytes and renal function are normal. -     Basic metabolic panel; Future  Controlled type 2 diabetes mellitus with complication, without long-term current use of insulin ([place])- His A1c is down to 5.9%.  I am concerned about risk of hypoglycemia so I have asked him to stop taking the sulfonylurea.  Will continue metformin at the current dose. -     Basic metabolic panel; Future -     Hemoglobin A1c; Future   I have discontinued Leticia F.  Thurlow's glimepiride. I am also having him maintain his beta carotene w/minerals, aspirin, glucose blood, PRODIGY LANCETS 26G, Loratadine, KRILL OIL PO, timolol, nitroGLYCERIN, dexlansoprazole, Alum Hydroxide-Mag Carbonate, pravastatin, alfuzosin, clonazePAM, finasteride, lisinopril, carvedilol, metFORMIN, and torsemide.  No orders of the defined types were placed in this encounter.    Follow-up: Return in about 6 months (around 06/29/2018).  Scarlette Calico, MD

## 2017-12-29 NOTE — Patient Instructions (Signed)

## 2018-01-05 DIAGNOSIS — H353133 Nonexudative age-related macular degeneration, bilateral, advanced atrophic without subfoveal involvement: Secondary | ICD-10-CM | POA: Diagnosis not present

## 2018-01-05 DIAGNOSIS — H401133 Primary open-angle glaucoma, bilateral, severe stage: Secondary | ICD-10-CM | POA: Diagnosis not present

## 2018-01-05 DIAGNOSIS — E119 Type 2 diabetes mellitus without complications: Secondary | ICD-10-CM | POA: Diagnosis not present

## 2018-01-05 DIAGNOSIS — H4312 Vitreous hemorrhage, left eye: Secondary | ICD-10-CM | POA: Diagnosis not present

## 2018-01-05 LAB — HM DIABETES EYE EXAM

## 2018-01-08 ENCOUNTER — Encounter: Payer: Self-pay | Admitting: Internal Medicine

## 2018-01-08 NOTE — Progress Notes (Signed)
Outside notes received. Information abstracted. Notes sent to scan.  

## 2018-02-27 ENCOUNTER — Encounter: Payer: Self-pay | Admitting: Internal Medicine

## 2018-03-06 ENCOUNTER — Encounter: Payer: Self-pay | Admitting: Internal Medicine

## 2018-03-06 DIAGNOSIS — I2510 Atherosclerotic heart disease of native coronary artery without angina pectoris: Secondary | ICD-10-CM

## 2018-03-06 DIAGNOSIS — E118 Type 2 diabetes mellitus with unspecified complications: Secondary | ICD-10-CM

## 2018-03-06 DIAGNOSIS — I2583 Coronary atherosclerosis due to lipid rich plaque: Principal | ICD-10-CM

## 2018-03-06 DIAGNOSIS — E785 Hyperlipidemia, unspecified: Secondary | ICD-10-CM

## 2018-03-06 MED ORDER — PRAVASTATIN SODIUM 20 MG PO TABS: 20.0000 mg | ORAL_TABLET | Freq: Every day | ORAL | 1 refills | Status: DC

## 2018-03-11 DIAGNOSIS — C44319 Basal cell carcinoma of skin of other parts of face: Secondary | ICD-10-CM | POA: Diagnosis not present

## 2018-04-05 ENCOUNTER — Encounter: Payer: Self-pay | Admitting: Internal Medicine

## 2018-04-05 DIAGNOSIS — E118 Type 2 diabetes mellitus with unspecified complications: Secondary | ICD-10-CM

## 2018-04-06 MED ORDER — METFORMIN HCL ER 750 MG PO TB24: 750.0000 mg | ORAL_TABLET | Freq: Every day | ORAL | 1 refills | Status: DC

## 2018-04-24 DIAGNOSIS — H16402 Unspecified corneal neovascularization, left eye: Secondary | ICD-10-CM | POA: Diagnosis not present

## 2018-04-24 DIAGNOSIS — H543 Unqualified visual loss, both eyes: Secondary | ICD-10-CM | POA: Diagnosis not present

## 2018-04-24 DIAGNOSIS — H401133 Primary open-angle glaucoma, bilateral, severe stage: Secondary | ICD-10-CM | POA: Diagnosis not present

## 2018-04-24 DIAGNOSIS — H1820 Unspecified corneal edema: Secondary | ICD-10-CM | POA: Diagnosis not present

## 2018-04-27 ENCOUNTER — Encounter: Payer: Self-pay | Admitting: Internal Medicine

## 2018-04-27 DIAGNOSIS — N400 Enlarged prostate without lower urinary tract symptoms: Secondary | ICD-10-CM

## 2018-04-28 MED ORDER — ALFUZOSIN HCL ER 10 MG PO TB24: 10.0000 mg | ORAL_TABLET | Freq: Every day | ORAL | 1 refills | Status: DC

## 2018-05-03 ENCOUNTER — Other Ambulatory Visit: Payer: Self-pay | Admitting: Internal Medicine

## 2018-05-06 ENCOUNTER — Other Ambulatory Visit: Payer: Self-pay | Admitting: Internal Medicine

## 2018-05-12 ENCOUNTER — Encounter: Payer: Self-pay | Admitting: Internal Medicine

## 2018-05-12 ENCOUNTER — Other Ambulatory Visit: Payer: Self-pay | Admitting: Internal Medicine

## 2018-05-12 DIAGNOSIS — I214 Non-ST elevation (NSTEMI) myocardial infarction: Secondary | ICD-10-CM

## 2018-05-12 DIAGNOSIS — I10 Essential (primary) hypertension: Secondary | ICD-10-CM

## 2018-05-12 DIAGNOSIS — I2510 Atherosclerotic heart disease of native coronary artery without angina pectoris: Secondary | ICD-10-CM

## 2018-05-12 MED ORDER — CARVEDILOL 3.125 MG PO TABS: ORAL_TABLET | ORAL | 1 refills | Status: DC

## 2018-06-01 ENCOUNTER — Other Ambulatory Visit: Payer: Self-pay | Admitting: Internal Medicine

## 2018-06-01 DIAGNOSIS — I10 Essential (primary) hypertension: Secondary | ICD-10-CM

## 2018-06-01 DIAGNOSIS — R600 Localized edema: Secondary | ICD-10-CM

## 2018-06-03 ENCOUNTER — Other Ambulatory Visit: Payer: Self-pay

## 2018-06-03 MED ORDER — NITROGLYCERIN 0.4 MG SL SUBL: 0.4000 mg | SUBLINGUAL_TABLET | SUBLINGUAL | 11 refills | Status: DC | PRN

## 2018-06-29 ENCOUNTER — Ambulatory Visit: Payer: PPO | Admitting: Internal Medicine

## 2018-07-22 ENCOUNTER — Other Ambulatory Visit: Payer: Self-pay | Admitting: Internal Medicine

## 2018-07-22 DIAGNOSIS — Z794 Long term (current) use of insulin: Secondary | ICD-10-CM

## 2018-07-22 DIAGNOSIS — I2510 Atherosclerotic heart disease of native coronary artery without angina pectoris: Secondary | ICD-10-CM

## 2018-07-22 DIAGNOSIS — E118 Type 2 diabetes mellitus with unspecified complications: Secondary | ICD-10-CM

## 2018-07-24 ENCOUNTER — Other Ambulatory Visit: Payer: Self-pay | Admitting: Internal Medicine

## 2018-07-24 DIAGNOSIS — E785 Hyperlipidemia, unspecified: Secondary | ICD-10-CM

## 2018-07-24 DIAGNOSIS — E118 Type 2 diabetes mellitus with unspecified complications: Secondary | ICD-10-CM

## 2018-07-24 DIAGNOSIS — I2510 Atherosclerotic heart disease of native coronary artery without angina pectoris: Secondary | ICD-10-CM

## 2018-07-24 DIAGNOSIS — I2583 Coronary atherosclerosis due to lipid rich plaque: Secondary | ICD-10-CM

## 2018-07-24 MED ORDER — METFORMIN HCL ER 750 MG PO TB24: 750.0000 mg | ORAL_TABLET | Freq: Every day | ORAL | 1 refills | Status: DC

## 2018-07-24 MED ORDER — PRAVASTATIN SODIUM 20 MG PO TABS: 20.0000 mg | ORAL_TABLET | Freq: Every day | ORAL | 1 refills | Status: DC

## 2018-08-25 ENCOUNTER — Other Ambulatory Visit: Payer: Self-pay | Admitting: Internal Medicine

## 2018-08-25 DIAGNOSIS — G2581 Restless legs syndrome: Secondary | ICD-10-CM

## 2018-08-25 MED ORDER — CLONAZEPAM 0.5 MG PO TABS: 0.5000 mg | ORAL_TABLET | Freq: Every day | ORAL | 3 refills | Status: DC

## 2018-10-20 ENCOUNTER — Other Ambulatory Visit: Payer: Self-pay | Admitting: Internal Medicine

## 2018-10-20 DIAGNOSIS — I10 Essential (primary) hypertension: Secondary | ICD-10-CM

## 2018-10-20 DIAGNOSIS — I2510 Atherosclerotic heart disease of native coronary artery without angina pectoris: Secondary | ICD-10-CM

## 2018-10-20 DIAGNOSIS — I214 Non-ST elevation (NSTEMI) myocardial infarction: Secondary | ICD-10-CM

## 2018-10-20 DIAGNOSIS — N400 Enlarged prostate without lower urinary tract symptoms: Secondary | ICD-10-CM

## 2018-10-20 DIAGNOSIS — R600 Localized edema: Secondary | ICD-10-CM

## 2018-10-20 MED ORDER — ALFUZOSIN HCL ER 10 MG PO TB24: 10.0000 mg | ORAL_TABLET | Freq: Every day | ORAL | 1 refills | Status: DC

## 2018-10-20 MED ORDER — CARVEDILOL 3.125 MG PO TABS: ORAL_TABLET | ORAL | 1 refills | Status: DC

## 2018-10-23 DIAGNOSIS — Z808 Family history of malignant neoplasm of other organs or systems: Secondary | ICD-10-CM | POA: Diagnosis not present

## 2018-10-23 DIAGNOSIS — D2272 Melanocytic nevi of left lower limb, including hip: Secondary | ICD-10-CM | POA: Diagnosis not present

## 2018-10-23 DIAGNOSIS — Z85828 Personal history of other malignant neoplasm of skin: Secondary | ICD-10-CM | POA: Diagnosis not present

## 2018-10-23 DIAGNOSIS — L570 Actinic keratosis: Secondary | ICD-10-CM | POA: Diagnosis not present

## 2018-10-23 DIAGNOSIS — L308 Other specified dermatitis: Secondary | ICD-10-CM | POA: Diagnosis not present

## 2018-10-23 DIAGNOSIS — D225 Melanocytic nevi of trunk: Secondary | ICD-10-CM | POA: Diagnosis not present

## 2019-01-09 ENCOUNTER — Other Ambulatory Visit: Payer: Self-pay | Admitting: Internal Medicine

## 2019-01-09 DIAGNOSIS — G2581 Restless legs syndrome: Secondary | ICD-10-CM

## 2019-01-09 MED ORDER — CLONAZEPAM 0.5 MG PO TABS: 0.5000 mg | ORAL_TABLET | Freq: Every day | ORAL | 3 refills | Status: DC

## 2019-01-25 ENCOUNTER — Other Ambulatory Visit: Payer: Self-pay | Admitting: Internal Medicine

## 2019-01-25 DIAGNOSIS — Z794 Long term (current) use of insulin: Secondary | ICD-10-CM

## 2019-01-25 DIAGNOSIS — I2510 Atherosclerotic heart disease of native coronary artery without angina pectoris: Secondary | ICD-10-CM

## 2019-01-25 MED ORDER — LISINOPRIL 2.5 MG PO TABS: ORAL_TABLET | ORAL | 1 refills | Status: DC

## 2019-02-11 ENCOUNTER — Other Ambulatory Visit: Payer: Self-pay | Admitting: *Deleted

## 2019-02-11 DIAGNOSIS — E118 Type 2 diabetes mellitus with unspecified complications: Secondary | ICD-10-CM

## 2019-02-11 DIAGNOSIS — I2510 Atherosclerotic heart disease of native coronary artery without angina pectoris: Secondary | ICD-10-CM

## 2019-02-11 DIAGNOSIS — E785 Hyperlipidemia, unspecified: Secondary | ICD-10-CM

## 2019-02-11 MED ORDER — PRAVASTATIN SODIUM 20 MG PO TABS: 20.0000 mg | ORAL_TABLET | Freq: Every day | ORAL | 1 refills | Status: DC

## 2019-03-02 ENCOUNTER — Other Ambulatory Visit: Payer: Self-pay | Admitting: Internal Medicine

## 2019-03-02 DIAGNOSIS — E118 Type 2 diabetes mellitus with unspecified complications: Secondary | ICD-10-CM

## 2019-03-02 DIAGNOSIS — I2510 Atherosclerotic heart disease of native coronary artery without angina pectoris: Secondary | ICD-10-CM

## 2019-03-02 DIAGNOSIS — E785 Hyperlipidemia, unspecified: Secondary | ICD-10-CM

## 2019-03-22 ENCOUNTER — Telehealth: Payer: Self-pay | Admitting: Internal Medicine

## 2019-03-22 NOTE — Telephone Encounter (Signed)
I don't see the patient is on a blood thinner but please advise if it is ok for pt to have vaccination. Thanks!

## 2019-03-22 NOTE — Telephone Encounter (Signed)
[facility] called where the patient lives. They want to know if he is on a blood thinner and if he can get the COVID vaccine.   Please call [phone]

## 2019-04-07 ENCOUNTER — Other Ambulatory Visit: Payer: Self-pay | Admitting: Internal Medicine

## 2019-04-07 DIAGNOSIS — E118 Type 2 diabetes mellitus with unspecified complications: Secondary | ICD-10-CM

## 2019-05-14 ENCOUNTER — Other Ambulatory Visit: Payer: Self-pay | Admitting: Internal Medicine

## 2019-05-14 ENCOUNTER — Encounter: Payer: Self-pay | Admitting: Internal Medicine

## 2019-05-14 DIAGNOSIS — N400 Enlarged prostate without lower urinary tract symptoms: Secondary | ICD-10-CM

## 2019-05-14 MED ORDER — ALFUZOSIN HCL ER 10 MG PO TB24: 10.0000 mg | ORAL_TABLET | Freq: Every day | ORAL | 1 refills | Status: DC

## 2019-05-17 ENCOUNTER — Encounter: Payer: Self-pay | Admitting: Internal Medicine

## 2019-05-20 ENCOUNTER — Encounter: Payer: Self-pay | Admitting: Internal Medicine

## 2019-05-20 ENCOUNTER — Ambulatory Visit (INDEPENDENT_AMBULATORY_CARE_PROVIDER_SITE_OTHER): Payer: PPO | Admitting: Internal Medicine

## 2019-05-20 ENCOUNTER — Other Ambulatory Visit: Payer: Self-pay

## 2019-05-20 VITALS — BP 112/58 | HR 77 | Temp 98.1°F | Resp 16 | Ht 68.0 in | Wt 176.0 lb

## 2019-05-20 DIAGNOSIS — E118 Type 2 diabetes mellitus with unspecified complications: Secondary | ICD-10-CM

## 2019-05-20 DIAGNOSIS — R10815 Periumbilic abdominal tenderness: Secondary | ICD-10-CM | POA: Diagnosis not present

## 2019-05-20 DIAGNOSIS — I10 Essential (primary) hypertension: Secondary | ICD-10-CM

## 2019-05-20 DIAGNOSIS — Z0000 Encounter for general adult medical examination without abnormal findings: Secondary | ICD-10-CM | POA: Insufficient documentation

## 2019-05-20 DIAGNOSIS — E785 Hyperlipidemia, unspecified: Secondary | ICD-10-CM

## 2019-05-20 LAB — LIPID PANEL
Cholesterol: 109 mg/dL (ref 0–200)
HDL: 26.8 mg/dL — ABNORMAL LOW (ref >39.00)
LDL Cholesterol: 56 mg/dL (ref 0–99)
NonHDL: 81.91
Total CHOL/HDL Ratio: 4
Triglycerides: 129 mg/dL (ref 0.0–149.0)
VLDL: 25.8 mg/dL (ref 0.0–40.0)

## 2019-05-20 LAB — URINALYSIS, ROUTINE W REFLEX MICROSCOPIC
Bilirubin Urine: NEGATIVE
Ketones, ur: NEGATIVE
Leukocytes,Ua: NEGATIVE
Nitrite: NEGATIVE
Specific Gravity, Urine: 1.025 (ref 1.000–1.030)
Total Protein, Urine: NEGATIVE
Urine Glucose: NEGATIVE
Urobilinogen, UA: 0.2 (ref 0.0–1.0)
WBC, UA: NONE SEEN (ref 0–?)
pH: 5.5 (ref 5.0–8.0)

## 2019-05-20 LAB — HEPATIC FUNCTION PANEL
ALT: 17 U/L (ref 0–53)
AST: 20 U/L (ref 0–37)
Albumin: 3.9 g/dL (ref 3.5–5.2)
Alkaline Phosphatase: 101 U/L (ref 39–117)
Bilirubin, Direct: 0.2 mg/dL (ref 0.0–0.3)
Total Bilirubin: 0.6 mg/dL (ref 0.2–1.2)
Total Protein: 6.1 g/dL (ref 6.0–8.3)

## 2019-05-20 LAB — CBC WITH DIFFERENTIAL/PLATELET
Basophils Absolute: 0 10*3/uL (ref 0.0–0.1)
Basophils Relative: 0.5 % (ref 0.0–3.0)
Eosinophils Absolute: 0.3 10*3/uL (ref 0.0–0.7)
Eosinophils Relative: 3.3 % (ref 0.0–5.0)
HCT: 39.4 % (ref 39.0–52.0)
Hemoglobin: 13.4 g/dL (ref 13.0–17.0)
Lymphocytes Relative: 28.3 % (ref 12.0–46.0)
Lymphs Abs: 2.3 10*3/uL (ref 0.7–4.0)
MCHC: 33.9 g/dL (ref 30.0–36.0)
MCV: 88.1 fl (ref 78.0–100.0)
Monocytes Absolute: 1.1 10*3/uL — ABNORMAL HIGH (ref 0.1–1.0)
Monocytes Relative: 13.3 % — ABNORMAL HIGH (ref 3.0–12.0)
Neutro Abs: 4.4 10*3/uL (ref 1.4–7.7)
Neutrophils Relative %: 54.6 % (ref 43.0–77.0)
Platelets: 167 10*3/uL (ref 150.0–400.0)
RBC: 4.47 Mil/uL (ref 4.22–5.81)
RDW: 14.2 % (ref 11.5–15.5)
WBC: 8 10*3/uL (ref 4.0–10.5)

## 2019-05-20 LAB — BASIC METABOLIC PANEL
BUN: 26 mg/dL — ABNORMAL HIGH (ref 6–23)
CO2: 29 mEq/L (ref 19–32)
Calcium: 8.9 mg/dL (ref 8.4–10.5)
Chloride: 101 mEq/L (ref 96–112)
Creatinine, Ser: 1.34 mg/dL (ref 0.40–1.50)
GFR: 50.1 mL/min — ABNORMAL LOW (ref >60.00)
Glucose, Bld: 133 mg/dL — ABNORMAL HIGH (ref 70–99)
Potassium: 3.9 mEq/L (ref 3.5–5.1)
Sodium: 136 mEq/L (ref 135–145)

## 2019-05-20 LAB — LIPASE: Lipase: 28 U/L (ref 11.0–59.0)

## 2019-05-20 LAB — TSH: TSH: 1.67 u[IU]/mL (ref 0.35–4.50)

## 2019-05-20 LAB — AMYLASE: Amylase: 38 U/L (ref 27–131)

## 2019-05-20 LAB — PSA: PSA: 1.43 ng/mL (ref 0.10–4.00)

## 2019-05-20 LAB — HEMOGLOBIN A1C: Hgb A1c MFr Bld: 7 % — ABNORMAL HIGH (ref 4.6–6.5)

## 2019-05-20 NOTE — Progress Notes (Signed)
Subjective:  Patient ID: Timothy Snyder, male    DOB: 1927-11-19  Age: 86 y.o. MRN: [id number]  CC: Annual Exam, Coronary Artery Disease, Hypertension, Diabetes, and Hyperlipidemia  This visit occurred during the SARS-CoV-2 public health emergency.  Safety protocols were in place, including screening questions prior to the visit, additional usage of staff PPE, and extensive cleaning of exam room while observing appropriate contact time as indicated for disinfecting solutions.    HPI Timothy Snyder presents for a CPX.  He complains of a 74-month history of midline lower abdominal pain that he describes as a diffuse achy sensation with weight loss.  He complains of urinary frequency but denies dysuria or hematuria.  He tells me his appetite has been good and he denies nausea, vomiting, diarrhea, or constipation.  There is been no witnessed melena or bright red blood per rectum.  He denies odynophagia or dysphagia.  Outpatient Medications Prior to Visit  Medication Sig Dispense Refill  . alfuzosin (UROXATRAL) 10 MG 24 hr tablet Take 1 tablet (10 mg total) by mouth daily with breakfast. 90 tablet 1  . Alum Hydroxide-Mag Carbonate (GAVISCON EXTRA STRENGTH) 160-105 MG CHEW Chew 1 tablet by mouth 3 (three) times daily with meals as needed. 90 tablet 5  . aspirin 81 MG tablet Take 81 mg by mouth daily.    . beta carotene w/minerals (OCUVITE) tablet Take 1 tablet by mouth at bedtime.     . carvedilol (COREG) 3.125 MG tablet TAKE 1 TABLET(3.125 MG) BY MOUTH TWICE DAILY WITH A MEAL 180 tablet 1  . clonazePAM (KLONOPIN) 0.5 MG tablet Take 1 tablet (0.5 mg total) by mouth at bedtime. 30 tablet 3  . dexlansoprazole (DEXILANT) 60 MG capsule Take 1 capsule (60 mg total) by mouth daily. 90 capsule 1  . finasteride (PROSCAR) 5 MG tablet TAKE 1 TABLET BY MOUTH DAILY 90 tablet 1  . glucose blood (PRODIGY NO CODING BLOOD GLUC) test strip 1 each by Other route daily. Use as instructed 100 each 1  . lisinopril  (ZESTRIL) 2.5 MG tablet TAKE 1 TABLET(2.5 MG) BY MOUTH DAILY 90 tablet 1  . metFORMIN (GLUCOPHAGE-XR) 750 MG 24 hr tablet TAKE 1 TABLET BY MOUTH EVERY DAY WITH BREAKFAST 90 tablet 1  . nitroGLYCERIN (NITROSTAT) 0.4 MG SL tablet Place 1 tablet (0.4 mg total) under the tongue every 5 (five) minutes x 3 doses as needed for chest pain. 25 tablet 11  . pravastatin (PRAVACHOL) 20 MG tablet TAKE 1 TABLET(20 MG) BY MOUTH DAILY 90 tablet 1  . PRODIGY LANCETS 26G MISC 1 each by Does not apply route daily. Dx code 250.00 100 each 1  . timolol (TIMOPTIC) 0.5 % ophthalmic solution Place 1 drop into both eyes 2 (two) times daily.  2  . torsemide (DEMADEX) 10 MG tablet TAKE 1 TABLET BY MOUTH DAILY 90 tablet 1  . triamcinolone cream (KENALOG) 0.1 % APPLY TO THE AFFECTED AREA TWICE DAILY FOR 7 DAYS     No facility-administered medications prior to visit.    ROS Review of Systems  Constitutional: Positive for unexpected weight change (wt loss). Negative for chills, diaphoresis and fatigue.  HENT: Negative.  Negative for trouble swallowing and voice change.   Eyes: Negative.   Respiratory: Negative for cough, chest tightness, shortness of breath and wheezing.   Gastrointestinal: Positive for abdominal pain. Negative for abdominal distention, blood in stool, constipation, diarrhea, nausea, rectal pain and vomiting.  Genitourinary: Positive for frequency. Negative for difficulty urinating, discharge, dysuria,  flank pain, hematuria, penile swelling, scrotal swelling, testicular pain and urgency.  Musculoskeletal: Negative.   Skin: Negative.   Neurological: Negative.   Hematological: Negative for adenopathy. Does not bruise/bleed easily.  Psychiatric/Behavioral: Negative.     Objective:  BP (!) 112/58 (BP Location: Left Arm, Patient Position: Sitting, Cuff Size: Normal)   Pulse 77   Temp 98.1 F (36.7 C) (Oral)   Resp 16   Ht 5\' 8"  (1.727 m)   Wt 176 lb (79.8 kg)   SpO2 97%   BMI 26.76 kg/m   BP  Readings from Last 3 Encounters:  05/20/19 (!) 112/58  12/29/17 (!) 118/54  09/01/17 120/60    Wt Readings from Last 3 Encounters:  05/20/19 176 lb (79.8 kg)  12/29/17 183 lb 12 oz (83.3 kg)  09/01/17 182 lb (82.6 kg)    Physical Exam Exam conducted with a chaperone present (his son).  Constitutional:      Appearance: Normal appearance.  HENT:     Nose: Nose normal.     Mouth/Throat:     Mouth: Mucous membranes are moist.  Eyes:     General: No scleral icterus.    Conjunctiva/sclera: Conjunctivae normal.  Cardiovascular:     Rate and Rhythm: Normal rate and regular rhythm.     Heart sounds: No murmur.  Pulmonary:     Effort: Pulmonary effort is normal.     Breath sounds: No stridor. No wheezing, rhonchi or rales.  Abdominal:     General: Abdomen is flat. There is no distension.     Palpations: There is no hepatomegaly, splenomegaly, mass or pulsatile mass.     Tenderness: There is abdominal tenderness in the right lower quadrant, periumbilical area and suprapubic area. There is no guarding or rebound.     Hernia: A hernia is present. Hernia is present in the ventral area. There is no hernia in the umbilical area, left inguinal area, right femoral area, left femoral area or right inguinal area.  Genitourinary:    Pubic Area: No rash.      Penis: Uncircumcised. No phimosis, paraphimosis, hypospadias, erythema, tenderness, discharge, swelling or lesions.      Testes: Normal.        Right: Mass, tenderness or swelling not present.        Left: Mass, tenderness or swelling not present.     Epididymis:     Right: Normal. Not inflamed or enlarged. No mass or tenderness.     Left: Normal. Not inflamed or enlarged. No mass or tenderness.     Prostate: Normal. Not enlarged, not tender and no nodules present.     Rectum: Guaiac result positive. Internal hemorrhoid present. No mass, tenderness, anal fissure or external hemorrhoid. Normal anal tone.  Musculoskeletal:        General:  Normal range of motion.     Cervical back: Neck supple.     Right lower leg: No edema.     Left lower leg: No edema.  Lymphadenopathy:     Cervical: No cervical adenopathy.     Lower Body: No right inguinal adenopathy. No left inguinal adenopathy.  Skin:    General: Skin is warm and dry.     Coloration: Skin is not pale.  Neurological:     General: No focal deficit present.     Mental Status: He is alert.  Psychiatric:        Mood and Affect: Mood normal.        Behavior: Behavior normal.  Lab Results  Component Value Date   WBC 8.0 05/20/2019   HGB 13.4 05/20/2019   HCT 39.4 05/20/2019   PLT 167.0 05/20/2019   GLUCOSE 133 (H) 05/20/2019   CHOL 109 05/20/2019   TRIG 129.0 05/20/2019   HDL 26.80 (L) 05/20/2019   LDLDIRECT 93.1 09/29/2012   LDLCALC 56 05/20/2019   ALT 17 05/20/2019   AST 20 05/20/2019   NA 136 05/20/2019   K 3.9 05/20/2019   CL 101 05/20/2019   CREATININE 1.34 05/20/2019   BUN 26 (H) 05/20/2019   CO2 29 05/20/2019   TSH 1.67 05/20/2019   PSA 1.43 05/20/2019   INR 1.20 09/21/2014   HGBA1C 7.0 (H) 05/20/2019   MICROALBUR 0.7 08/07/2017    DG Chest 2 View  Result Date: 01/28/2017 CLINICAL DATA:  Cough and hoarseness EXAM: CHEST  2 VIEW COMPARISON:  April 02, 2016 FINDINGS: There is fibrotic change in lung bases. There is a calcified granuloma in the left lower lobe laterally. There is no edema or consolidation. Heart size is upper normal with pulmonary vascularity within normal limits. No adenopathy. There is aortic atherosclerosis. There is degenerative change in the thoracic spine. There is a stent in the left anterior descending coronary artery. IMPRESSION: Bibasilar lung fibrosis. Granuloma lateral left base. No edema or consolidation. Stable cardiac silhouette. There is aortic atherosclerosis. Aortic Atherosclerosis (ICD10-I70.0). Electronically Signed   By: Lowella Grip III M.D.   On: 01/28/2017 11:27    Assessment & Plan:   [place]  was seen today for annual exam, coronary artery disease, hypertension, diabetes and hyperlipidemia.  Diagnoses and all orders for this visit:  Essential hypertension- His blood pressure is adequately well controlled.  Electrolytes are normal.  His renal function is stable. -     Basic metabolic panel; Future -     TSH; Future -     Urinalysis, Routine w reflex microscopic; Future -     Urinalysis, Routine w reflex microscopic -     TSH -     Basic metabolic panel  Hyperlipidemia with target LDL less than 70- He has achieved his LDL goal and is doing well on the statin. -     Lipid panel; Future -     TSH; Future -     TSH -     Lipid panel  Controlled type 2 diabetes mellitus with complication, without long-term current use of insulin ([place])- His A1c is at 7.0%.  He has achieved adequate glycemic control. -     Basic metabolic panel; Future -     Hemoglobin A1c; Future -     HM Diabetes Foot Exam -     Hemoglobin A1c -     Basic metabolic panel  Routine general medical examination at a health care facility- Exam completed, labs reviewed, vaccines reviewed and updated, patient education was given.  Periumbilical abdominal tenderness without rebound tenderness- He has a 48-month history of abdominal pain and weight loss.  On examination there is tenderness but no evidence of an acute abdominal process.  There is blood in the stool.  He has decreased renal function so I have asked him to undergo a CT scan of the abdomen and pelvis without contrast to try to identify what is causing his discomfort.  I am concerned about malignancy. -     Lipase; Future -     Amylase; Future -     CBC with Differential/Platelet; Future -     Hepatic function panel; Future -  Urinalysis, Routine w reflex microscopic; Future -     PSA; Future -     PSA -     Urinalysis, Routine w reflex microscopic -     Hepatic function panel -     CBC with Differential/Platelet -     Amylase -     Lipase -     CT  Abdomen Pelvis Wo Contrast; Future   I am having Gardy F. Garin maintain his beta carotene w/minerals, aspirin, glucose blood, Prodigy Lancets 26G, timolol, dexlansoprazole, Alum Hydroxide-Mag Carbonate, nitroGLYCERIN, torsemide, finasteride, carvedilol, clonazePAM, lisinopril, pravastatin, metFORMIN, alfuzosin, and triamcinolone cream.  No orders of the defined types were placed in this encounter.  I spent 50 minutes in preparing to see the patient by review of recent labs, imaging and procedures, obtaining and reviewing separately obtained history, communicating with the patient and family or caregiver, ordering medications, tests or procedures, and documenting clinical information in the EHR including the differential Dx, treatment, and any further evaluation and other management of abdominal pain, weight loss, hypertension, diabetes, and hypercholesterolemia.    Follow-up: Return in about 3 weeks (around 06/10/2019).  Scarlette Calico, MD

## 2019-05-20 NOTE — Patient Instructions (Signed)
Abdominal Pain, Adult Pain in the abdomen (abdominal pain) can be caused by many things. Often, abdominal pain is not serious and it gets better with no treatment or by being treated at home. However, sometimes abdominal pain is serious. Your health care provider will ask questions about your medical history and do a physical exam to try to determine the cause of your abdominal pain. Follow these instructions at home:  Medicines  Take over-the-counter and prescription medicines only as told by your health care provider.  Do not take a laxative unless told by your health care provider. General instructions  Watch your condition for any changes.  Drink enough fluid to keep your urine pale yellow.  Keep all follow-up visits as told by your health care provider. This is important. Contact a health care provider if:  Your abdominal pain changes or gets worse.  You are not hungry or you lose weight without trying.  You are constipated or have diarrhea for more than 2-3 days.  You have pain when you urinate or have a bowel movement.  Your abdominal pain wakes you up at night.  Your pain gets worse with meals, after eating, or with certain foods.  You are vomiting and cannot keep anything down.  You have a fever.  You have blood in your urine. Get help right away if:  Your pain does not go away as soon as your health care provider told you to expect.  You cannot stop vomiting.  Your pain is only in areas of the abdomen, such as the right side or the left lower portion of the abdomen. Pain on the right side could be caused by appendicitis.  You have bloody or black stools, or stools that look like tar.  You have severe pain, cramping, or bloating in your abdomen.  You have signs of dehydration, such as: ? Dark urine, very little urine, or no urine. ? Cracked lips. ? Dry mouth. ? Sunken eyes. ? Sleepiness. ? Weakness.  You have trouble breathing or chest  pain. Summary  Often, abdominal pain is not serious and it gets better with no treatment or by being treated at home. However, sometimes abdominal pain is serious.  Watch your condition for any changes.  Take over-the-counter and prescription medicines only as told by your health care provider.  Contact a health care provider if your abdominal pain changes or gets worse.  Get help right away if you have severe pain, cramping, or bloating in your abdomen. This information is not intended to replace advice given to you by your health care provider. Make sure you discuss any questions you have with your health care provider. Document Revised: 06/15/2018 Document Reviewed: 06/15/2018 Elsevier Patient Education  2020 [employer]  

## 2019-05-21 ENCOUNTER — Encounter: Payer: Self-pay | Admitting: Internal Medicine

## 2019-05-25 ENCOUNTER — Encounter: Payer: Self-pay | Admitting: Internal Medicine

## 2019-05-28 ENCOUNTER — Ambulatory Visit (INDEPENDENT_AMBULATORY_CARE_PROVIDER_SITE_OTHER)
Admission: RE | Admit: 2019-05-28 | Discharge: 2019-05-28 | Disposition: A | Discharge status: Home / Self Care | Payer: PPO | Source: Ambulatory Visit | Attending: Internal Medicine | Admitting: Internal Medicine

## 2019-05-28 ENCOUNTER — Other Ambulatory Visit: Payer: Self-pay

## 2019-05-28 DIAGNOSIS — K5730 Diverticulosis of large intestine without perforation or abscess without bleeding: Secondary | ICD-10-CM | POA: Diagnosis not present

## 2019-05-28 DIAGNOSIS — R10815 Periumbilic abdominal tenderness: Secondary | ICD-10-CM | POA: Diagnosis not present

## 2019-05-29 ENCOUNTER — Encounter: Payer: Self-pay | Admitting: Internal Medicine

## 2019-05-29 ENCOUNTER — Other Ambulatory Visit: Payer: Self-pay | Admitting: Internal Medicine

## 2019-05-29 DIAGNOSIS — K5732 Diverticulitis of large intestine without perforation or abscess without bleeding: Secondary | ICD-10-CM

## 2019-05-29 MED ORDER — AMOXICILLIN-POT CLAVULANATE 875-125 MG PO TABS: 1.0000 | ORAL_TABLET | Freq: Two times a day (BID) | ORAL | 0 refills | Status: AC

## 2019-05-31 ENCOUNTER — Other Ambulatory Visit: Payer: Self-pay | Admitting: Internal Medicine

## 2019-05-31 ENCOUNTER — Encounter: Payer: Self-pay | Admitting: Internal Medicine

## 2019-05-31 DIAGNOSIS — D490 Neoplasm of unspecified behavior of digestive system: Secondary | ICD-10-CM

## 2019-06-03 ENCOUNTER — Encounter: Payer: Self-pay | Admitting: Internal Medicine

## 2019-06-04 MED ORDER — FINASTERIDE 5 MG PO TABS: 5.0000 mg | ORAL_TABLET | Freq: Every day | ORAL | 1 refills | Status: DC

## 2019-06-10 NOTE — Progress Notes (Signed)
Cardiology Office Note   Date:  06/17/2019   ID:  Timothy Snyder, DOB [birth year], MRN [id number]  PCP:  Janith Lima, MD  Cardiologist:  Dr. Juvon Teater Martinique  Chief Complaint  Patient presents with  . Coronary Artery Disease     History of Present Illness: Timothy Snyder is a 86 y.o. male who presents for ongoing assessment and management of coronary artery disease, with history of non-STEMI in August 2016 requiring a synergy drug-eluting stent to a 99% mid LAD lesion with other history to include type 2 diabetes, hypertension, hyperlipidemia, chronic kidney disease stage III, and macular degeneration causing blindness.  He is seen today with his son. He denies any chest pain, dyspnea, palpitations, or claudication. Is being evaluated for some diverticulitis.     Past Medical History:  Diagnosis Date  . Actinic keratosis   . ARTHRITIS   . BPH (benign prostatic hyperplasia)   . CAD (coronary artery disease)    cath 09/21/2014 1v dx, 99% mid LAD treated with DES, 50% prox to mid LAD.  Marland Kitchen Dementia ([place])   . DIABETES MELLITUS, TYPE II   . GERD (gastroesophageal reflux disease)   . GLUCOMA   . HYPERTENSION   . Macular degeneration    L>R vision loss, legally blind  . Restless leg syndrome     Past Surgical History:  Procedure Laterality Date  . CARDIAC CATHETERIZATION N/A 09/21/2014   Procedure: Left Heart Cath and Coronary Angiography;  Surgeon: Roselind Klus M Martinique, MD;  Location: [facility];  Service: Cardiovascular;  Laterality: N/A;  . CARDIAC CATHETERIZATION  09/21/2014   Procedure: Coronary Stent Intervention;  Surgeon: Jasman Murri M Martinique, MD;  Location: [facility];  Service: Cardiovascular;;  . NO PAST SURGERIES       Current Outpatient Medications  Medication Sig Dispense Refill  . alfuzosin (UROXATRAL) 10 MG 24 hr tablet Take 1 tablet (10 mg total) by mouth daily with breakfast. 90 tablet 1  . Alum Hydroxide-Mag Carbonate (GAVISCON EXTRA STRENGTH) 160-105 MG  CHEW Chew 1 tablet by mouth 3 (three) times daily with meals as needed. 90 tablet 5  . aspirin 81 MG tablet Take 81 mg by mouth daily.    . beta carotene w/minerals (OCUVITE) tablet Take 1 tablet by mouth at bedtime.     . carvedilol (COREG) 3.125 MG tablet TAKE 1 TABLET(3.125 MG) BY MOUTH TWICE DAILY WITH A MEAL 180 tablet 1  . clonazePAM (KLONOPIN) 0.5 MG tablet TAKE 1 TABLET(0.5 MG) BY MOUTH AT BEDTIME 30 tablet 5  . dexlansoprazole (DEXILANT) 60 MG capsule Take 1 capsule (60 mg total) by mouth daily. 90 capsule 1  . finasteride (PROSCAR) 5 MG tablet Take 1 tablet (5 mg total) by mouth daily. 90 tablet 1  . glucose blood (PRODIGY NO CODING BLOOD GLUC) test strip 1 each by Other route daily. Use as instructed 100 each 1  . lisinopril (ZESTRIL) 2.5 MG tablet TAKE 1 TABLET(2.5 MG) BY MOUTH DAILY 90 tablet 1  . metFORMIN (GLUCOPHAGE-XR) 750 MG 24 hr tablet TAKE 1 TABLET BY MOUTH EVERY DAY WITH BREAKFAST 90 tablet 1  . nitroGLYCERIN (NITROSTAT) 0.4 MG SL tablet Place 1 tablet (0.4 mg total) under the tongue every 5 (five) minutes x 3 doses as needed for chest pain. 25 tablet 11  . pravastatin (PRAVACHOL) 20 MG tablet TAKE 1 TABLET(20 MG) BY MOUTH DAILY 90 tablet 1  . PRODIGY LANCETS 26G MISC 1 each by Does not apply route daily. Dx  code 250.00 100 each 1  . timolol (TIMOPTIC) 0.5 % ophthalmic solution Place 1 drop into both eyes 2 (two) times daily.  2  . torsemide (DEMADEX) 10 MG tablet TAKE 1 TABLET BY MOUTH DAILY 90 tablet 1  . triamcinolone cream (KENALOG) 0.1 % APPLY TO THE AFFECTED AREA TWICE DAILY FOR 7 DAYS     No current facility-administered medications for this visit.    Allergies:   Brinzolamide-brimonidine and Other    Social History:  The patient  reports that he has never smoked. He has never used smokeless tobacco. He reports that he does not drink alcohol or use drugs.   Family History:  The patient's family history includes Cancer in his father; Coronary artery disease (age  of onset: 22) in his mother; Heart failure in his mother.    ROS: All other systems are reviewed and negative. Unless otherwise mentioned in H&P    PHYSICAL EXAM: VS:  BP (!) 112/51   Pulse 62   Temp (!) 97 F (36.1 C)   Ht 5\' 8"  (1.727 m)   Wt 176 lb 3.2 oz (79.9 kg)   SpO2 98%   BMI 26.79 kg/m  , BMI Body mass index is 26.79 kg/m. GEN: Well nourished, well developed, in no acute distress  HEENT: normal He does not track due to blindness  Neck: no JVD, carotid bruits, or masses Cardiac: RRR; soft systolic murmurs, rubs, or gallops,no edema DP is diminished bilaterally.  Respiratory:  clear to auscultation bilaterally, normal work of breathing GI: soft, nontender, nondistended, + BS MS: no deformity or atrophy  Skin: warm and dry, no rash Neuro:  Strength and sensation are intact Psych: euthymic mood, full affect   EKG: Ecg today shows NSR rate 62 with first degree AV block. Otherwise normal. I have personally reviewed and interpreted this study.   Recent Labs: 05/20/2019: ALT 17; BUN 26; Creatinine, Ser 1.34; Hemoglobin 13.4; Platelets 167.0; Potassium 3.9; Sodium 136; TSH 1.67    Lipid Panel    Component Value Date/Time   CHOL 109 05/20/2019 1136   TRIG 129.0 05/20/2019 1136   TRIG 171 12/23/2008 0000   HDL 26.80 (L) 05/20/2019 1136   CHOLHDL 4 05/20/2019 1136   VLDL 25.8 05/20/2019 1136   LDLCALC 56 05/20/2019 1136   LDLDIRECT 93.1 09/29/2012 0930      Wt Readings from Last 3 Encounters:  06/17/19 176 lb 3.2 oz (79.9 kg)  05/20/19 176 lb (79.8 kg)  12/29/17 183 lb 12 oz (83.3 kg)      Other studies Reviewed:  Cardiac cath 09/21/14:  Coronary Stent Intervention  Left Heart Cath and Coronary Angiography  Conclusion   Prox RCA to Dist RCA lesion, 10% stenosed.  Mid Cx to Dist Cx lesion, 10% stenosed.  Prox LAD to Mid LAD lesion, 50% stenosed.  Mid LAD lesion, 99% stenosed. There is a 0% residual stenosis post intervention.  A drug-eluting stent was  placed.   1. Single vessel obstructive CAD 2. Successful PCI of the proximal to mid LAD with a DES.  Recommendation: DAPT for one year. Check Echo for LV function. Possible DC tomorrow if no complications.      Echo 09/22/14: Left ventricle: The cavity size was normal. Systolic function was   normal. The estimated ejection fraction was in the range of 55%   to 60%. Mild hypokinesis of the apical myocardium. Features are   consistent with a pseudonormal left ventricular filling pattern,   with concomitant abnormal relaxation  and increased filling   pressure (grade 2 diastolic dysfunction). - Left atrium: The atrium was moderately to severely dilated.  ASSESSMENT AND PLAN:  1. CAD: he is asymptomatic. Remote stent in 2016.  He will continue current medication regimen for now with ASA 81, ACEi, Coreg, and statin.    2. Hypercholesterolemia; Continue statin therapy. Excellent control.  3. Diabetes: Followed by Dr. Ronnald Ramp. Last A1c 7%.   Follow up in one year   Doyle Kunath Martinique MD, [facility]     06/17/2019 2:45 PM    [place]

## 2019-06-11 DIAGNOSIS — H04123 Dry eye syndrome of bilateral lacrimal glands: Secondary | ICD-10-CM | POA: Diagnosis not present

## 2019-06-11 DIAGNOSIS — H353113 Nonexudative age-related macular degeneration, right eye, advanced atrophic without subfoveal involvement: Secondary | ICD-10-CM | POA: Diagnosis not present

## 2019-06-11 DIAGNOSIS — H401133 Primary open-angle glaucoma, bilateral, severe stage: Secondary | ICD-10-CM | POA: Diagnosis not present

## 2019-06-11 DIAGNOSIS — Z961 Presence of intraocular lens: Secondary | ICD-10-CM | POA: Diagnosis not present

## 2019-06-11 LAB — HM DIABETES EYE EXAM

## 2019-06-13 ENCOUNTER — Other Ambulatory Visit: Payer: Self-pay | Admitting: Internal Medicine

## 2019-06-13 DIAGNOSIS — G2581 Restless legs syndrome: Secondary | ICD-10-CM

## 2019-06-16 ENCOUNTER — Encounter: Payer: Self-pay | Admitting: Internal Medicine

## 2019-06-17 ENCOUNTER — Other Ambulatory Visit: Payer: Self-pay

## 2019-06-17 ENCOUNTER — Ambulatory Visit (INDEPENDENT_AMBULATORY_CARE_PROVIDER_SITE_OTHER): Payer: PPO | Admitting: Cardiology

## 2019-06-17 ENCOUNTER — Encounter: Payer: Self-pay | Admitting: *Deleted

## 2019-06-17 VITALS — BP 112/51 | HR 62 | Temp 97.0°F | Ht 68.0 in | Wt 176.2 lb

## 2019-06-17 DIAGNOSIS — L578 Other skin changes due to chronic exposure to nonionizing radiation: Secondary | ICD-10-CM | POA: Diagnosis not present

## 2019-06-17 DIAGNOSIS — I2510 Atherosclerotic heart disease of native coronary artery without angina pectoris: Secondary | ICD-10-CM | POA: Diagnosis not present

## 2019-06-17 DIAGNOSIS — L570 Actinic keratosis: Secondary | ICD-10-CM | POA: Diagnosis not present

## 2019-06-17 DIAGNOSIS — D2272 Melanocytic nevi of left lower limb, including hip: Secondary | ICD-10-CM | POA: Diagnosis not present

## 2019-06-17 DIAGNOSIS — E7800 Pure hypercholesterolemia, unspecified: Secondary | ICD-10-CM

## 2019-06-17 DIAGNOSIS — E119 Type 2 diabetes mellitus without complications: Secondary | ICD-10-CM | POA: Diagnosis not present

## 2019-06-17 DIAGNOSIS — Z794 Long term (current) use of insulin: Secondary | ICD-10-CM | POA: Diagnosis not present

## 2019-06-17 DIAGNOSIS — Z85828 Personal history of other malignant neoplasm of skin: Secondary | ICD-10-CM | POA: Diagnosis not present

## 2019-06-17 DIAGNOSIS — Z808 Family history of malignant neoplasm of other organs or systems: Secondary | ICD-10-CM | POA: Diagnosis not present

## 2019-06-17 DIAGNOSIS — D225 Melanocytic nevi of trunk: Secondary | ICD-10-CM | POA: Diagnosis not present

## 2019-06-17 DIAGNOSIS — L821 Other seborrheic keratosis: Secondary | ICD-10-CM | POA: Diagnosis not present

## 2019-06-21 ENCOUNTER — Encounter: Payer: Self-pay | Admitting: Internal Medicine

## 2019-06-21 ENCOUNTER — Ambulatory Visit: Payer: PPO | Admitting: Internal Medicine

## 2019-06-21 VITALS — BP 112/56 | HR 65 | Temp 98.5°F | Ht 68.0 in | Wt 171.0 lb

## 2019-06-21 DIAGNOSIS — R195 Other fecal abnormalities: Secondary | ICD-10-CM

## 2019-06-21 DIAGNOSIS — R933 Abnormal findings on diagnostic imaging of other parts of digestive tract: Secondary | ICD-10-CM

## 2019-06-21 DIAGNOSIS — K219 Gastro-esophageal reflux disease without esophagitis: Secondary | ICD-10-CM | POA: Diagnosis not present

## 2019-06-21 DIAGNOSIS — R1030 Lower abdominal pain, unspecified: Secondary | ICD-10-CM | POA: Diagnosis not present

## 2019-06-21 DIAGNOSIS — R634 Abnormal weight loss: Secondary | ICD-10-CM

## 2019-06-21 NOTE — Patient Instructions (Signed)
You have been scheduled for an endoscopy and colonoscopy. Please follow the written instructions given to you at your visit today. Please pick up your prep supplies at the pharmacy within the next 1-3 days. If you use inhalers (even only as needed), please bring them with you on the day of your procedure. Your physician has requested that you go to www.startemmi.com and enter the access code given to you at your visit today. This web site gives a general overview about your procedure. However, you should still follow specific instructions given to you by our office regarding your preparation for the procedure.  If you are age 84 or older, your body mass index should be between 23-30. Your Body mass index is 26 kg/m. If this is out of the aforementioned range listed, please consider follow up with your Primary Care Provider.  If you are age 28 or younger, your body mass index should be between 19-25. Your Body mass index is 26 kg/m. If this is out of the aformentioned range listed, please consider follow up with your Primary Care Provider.

## 2019-06-21 NOTE — Progress Notes (Signed)
Patient ID: Timothy Snyder, male   DOB: 12-12-1927, 86 y.o.   MRN: [id number] HPI: Timothy Snyder is an 86 year old male with a history of GERD, diverticulosis, CAD with prior PCI, diabetes, glaucoma and macular degeneration with blindness, who is seen in consult at the request of Dr. Ronnald Ramp to evaluate abdominal pain, weight loss and abnormal GI imaging.  He is here today with his son.  He reports that over the last 3 to 4 months he has had aching in his lower abdomen.  This is present most days and is associated with both belching and flatulence.  He reports having bowel movements twice per day without change in bowel regimen or habit.  He does not see his stools due to blindness but his son occasionally sees remnants of stool which he reports as usually loose and can be dark in color.  He reports a good appetite but has lost about 20 pounds in 1 year.  He did want to intentionally lose weight and so he has been trying for portion control.  There has been no report of visible blood in stool or melena.  He does have a history of heartburn and though Dexilant is on his med list he is not currently taking this medicine.  He is taking Nexium 20 mg over-the-counter daily.  He is not having heartburn with this medication.  He denies dysphagia and odynophagia.  He has had prior colonoscopy but more than 20 years ago.  He had a CT scan of the abdomen performed by Dr. Ronnald Ramp recently which was abnormal.  CT abdomen and pelvis without contrast was performed on 05/29/2019.  This showed diffuse diverticular disease in the colon.  There was a focal area of mild wall thickening and possible inflammatory change of the hepatic flexure suggestive of mild diverticulitis though an inflammatory mass could not be excluded.  Incidentally there was irregular nodular consolidation at the medial left base in the lungs.  There is also haziness at the porta hepatis without discernible cause on CT.  Possible gallstones in a contracted  gallbladder without inflammatory features.  After the CT scan he was treated with Augmentin 875 mg twice daily x7 days.  He reports this may have helped the aching but it has since returned.  It did improve gas but briefly and now he feels the gas may even be worse.  Stool was heme positive when he saw Dr. Ronnald Ramp.  Past Medical History:  Diagnosis Date  . Actinic keratosis   . ARTHRITIS   . BPH (benign prostatic hyperplasia)   . CAD (coronary artery disease)    cath 09/21/2014 1v dx, 99% mid LAD treated with DES, 50% prox to mid LAD.  Marland Kitchen Dementia ([place])   . DIABETES MELLITUS, TYPE II   . GERD (gastroesophageal reflux disease)   . GLUCOMA   . HYPERTENSION   . Macular degeneration    L>R vision loss, legally blind  . Restless leg syndrome     Past Surgical History:  Procedure Laterality Date  . CARDIAC CATHETERIZATION N/A 09/21/2014   Procedure: Left Heart Cath and Coronary Angiography;  Surgeon: Peter M Martinique, MD;  Location: [facility];  Service: Cardiovascular;  Laterality: N/A;  . CARDIAC CATHETERIZATION  09/21/2014   Procedure: Coronary Stent Intervention;  Surgeon: Peter M Martinique, MD;  Location: [facility];  Service: Cardiovascular;;  . NO PAST SURGERIES      Outpatient Medications Prior to Visit  Medication Sig Dispense Refill  .  alfuzosin (UROXATRAL) 10 MG 24 hr tablet Take 1 tablet (10 mg total) by mouth daily with breakfast. 90 tablet 1  . Alum Hydroxide-Mag Carbonate (GAVISCON EXTRA STRENGTH) 160-105 MG CHEW Chew 1 tablet by mouth 3 (three) times daily with meals as needed. 90 tablet 5  . aspirin 81 MG tablet Take 81 mg by mouth daily.    . beta carotene w/minerals (OCUVITE) tablet Take 1 tablet by mouth at bedtime.     . carvedilol (COREG) 3.125 MG tablet TAKE 1 TABLET(3.125 MG) BY MOUTH TWICE DAILY WITH A MEAL 180 tablet 1  . clonazePAM (KLONOPIN) 0.5 MG tablet TAKE 1 TABLET(0.5 MG) BY MOUTH AT BEDTIME 30 tablet 5  . dexlansoprazole (DEXILANT) 60 MG capsule  Take 1 capsule (60 mg total) by mouth daily. 90 capsule 1  . finasteride (PROSCAR) 5 MG tablet Take 1 tablet (5 mg total) by mouth daily. 90 tablet 1  . glucose blood (PRODIGY NO CODING BLOOD GLUC) test strip 1 each by Other route daily. Use as instructed 100 each 1  . lisinopril (ZESTRIL) 2.5 MG tablet TAKE 1 TABLET(2.5 MG) BY MOUTH DAILY 90 tablet 1  . metFORMIN (GLUCOPHAGE-XR) 750 MG 24 hr tablet TAKE 1 TABLET BY MOUTH EVERY DAY WITH BREAKFAST 90 tablet 1  . nitroGLYCERIN (NITROSTAT) 0.4 MG SL tablet Place 1 tablet (0.4 mg total) under the tongue every 5 (five) minutes x 3 doses as needed for chest pain. 25 tablet 11  . pravastatin (PRAVACHOL) 20 MG tablet TAKE 1 TABLET(20 MG) BY MOUTH DAILY 90 tablet 1  . PRODIGY LANCETS 26G MISC 1 each by Does not apply route daily. Dx code 250.00 100 each 1  . timolol (TIMOPTIC) 0.5 % ophthalmic solution Place 1 drop into both eyes 2 (two) times daily.  2  . torsemide (DEMADEX) 10 MG tablet TAKE 1 TABLET BY MOUTH DAILY 90 tablet 1  . triamcinolone cream (KENALOG) 0.1 % APPLY TO THE AFFECTED AREA TWICE DAILY FOR 7 DAYS     No facility-administered medications prior to visit.    Allergies  Allergen Reactions  . Brinzolamide-Brimonidine Other (See Comments)  . Other Other (See Comments)    Several other eye medications - intollerant    Family History  Problem Relation Age of Onset  . Coronary artery disease Mother 17       MI  . Heart failure Mother   . Cancer Father     Social History   Tobacco Use  . Smoking status: Never Smoker  . Smokeless tobacco: Never Used  Substance Use Topics  . Alcohol use: No  . Drug use: No    ROS: As per history of present illness, otherwise negative  BP (!) 112/56   Pulse 65   Temp 98.5 F (36.9 C)   Ht 5\' 8"  (1.727 m)   Wt 171 lb (77.6 kg)   BMI 26.00 kg/m  Constitutional: Well-developed and well-nourished. No distress. HEENT: Normocephalic and atraumatic. Conjunctivae are normal.  No scleral  icterus. Neck: Neck supple. Trachea midline. Cardiovascular: Normal rate, regular rhythm and intact distal pulses. Pulmonary/chest: Effort normal and breath sounds normal. No wheezing, rales or rhonchi. Abdominal: Soft, tender in the Snyder mid and bilateral lower abdomen, nondistended. Bowel sounds active throughou There are no masses palpable. Extremities: no clubbing, cyanosis, or edema Neurological: Alert and oriented to person place and time. Skin: Skin is warm and dry.  Psychiatric: Normal mood and affect. Behavior is normal.  RELEVANT LABS AND IMAGING: CBC  Component Value Date/Time   WBC 8.0 05/20/2019 1136   RBC 4.47 05/20/2019 1136   HGB 13.4 05/20/2019 1136   HCT 39.4 05/20/2019 1136   PLT 167.0 05/20/2019 1136   MCV 88.1 05/20/2019 1136   MCH 30.4 09/22/2014 0306   MCHC 33.9 05/20/2019 1136   RDW 14.2 05/20/2019 1136   LYMPHSABS 2.3 05/20/2019 1136   MONOABS 1.1 (H) 05/20/2019 1136   EOSABS 0.3 05/20/2019 1136   BASOSABS 0.0 05/20/2019 1136    CMP     Component Value Date/Time   NA 136 05/20/2019 1136   K 3.9 05/20/2019 1136   CL 101 05/20/2019 1136   CO2 29 05/20/2019 1136   GLUCOSE 133 (H) 05/20/2019 1136   BUN 26 (H) 05/20/2019 1136   CREATININE 1.34 05/20/2019 1136   CALCIUM 8.9 05/20/2019 1136   PROT 6.1 05/20/2019 1136   ALBUMIN 3.9 05/20/2019 1136   AST 20 05/20/2019 1136   ALT 17 05/20/2019 1136   ALKPHOS 101 05/20/2019 1136   BILITOT 0.6 05/20/2019 1136   GFRNONAA 52 (L) 09/22/2014 0306   GFRAA 60 (L) 09/22/2014 0306   CT ABDOMEN AND PELVIS WITHOUT CONTRAST   TECHNIQUE: Multidetector CT imaging of the abdomen and pelvis was performed following the standard protocol without IV contrast.   COMPARISON:  None.   FINDINGS: Lower chest: Lung bases demonstrate calcified granuloma. Mild subpleural fibrosis. Irregular nodular consolidation at the medial left base measuring 11 mm, series 3, image number 12. 7 mm Snyder middle lobe lung nodule,  series 3 image number 5 with possible spiculation. Adjacent consolidation and mild bronchiectasis. Borderline cardiomegaly. Coronary vascular calcification.   Hepatobiliary: Calcified granuloma. Cord like structure with calcifications in the porta hepatis possibly representing contracted gallbladder with small stones. No biliary dilatation   Pancreas: No ductal dilatation. Slightly hazy appearance at the porta hepatis   Spleen: Numerous granuloma   Adrenals/Urinary Tract: Adrenal glands are normal. Kidneys show no hydronephrosis. The urinary bladder is unremarkable   Stomach/Bowel: The stomach is nonenlarged. No dilated small bowel. Diffuse diverticular disease of the colon. Focal wall thickening at the hepatic flexure with mild stranding/inflammatory change. Negative appendix   Vascular/Lymphatic: Moderate aortic atherosclerosis without aneurysm. Subcentimeter retroperitoneal lymph nodes.   Reproductive: Prostate is unremarkable.   Other: Negative for free air or free fluid   Musculoskeletal: Degenerative changes. No acute or suspicious osseous abnormality   IMPRESSION: 1. Diffuse diverticular disease of the colon. Focal area of mild wall thickening and possible inflammatory change at the hepatic flexure suggestive of mild diverticulitis though inflammatory colon mass is also possible. Correlation with direct visualization is suggested. 2. Irregular nodular consolidation in the medial left base with additional 7 mm Snyder middle lobe lung nodule. Non-contrast chest CT at 3-6 months is recommended. If the nodules are stable at time of repeat CT, then future CT at 18-24 months (from today's scan) is considered optional for low-risk patients, but is recommended for high-risk patients. This recommendation follows the consensus statement: Guidelines for Management of Incidental Pulmonary Nodules Detected on CT Images: From the Fleischner Society 2017; Radiology 2017;  284:228-243. 3. Nonspecific haziness/possible inflammation at the porta hepatis without discernible cause on current CT. Could correlate with pancreas enzymes to exclude pancreatitis. Possible gallstones in a contracted gallbladder though no other inflammatory features involving the gallbladder.     Electronically Signed   By: Donavan Foil M.D.   On: 05/29/2019 01:25  ASSESSMENT/PLAN: 86 year old male with a history of GERD, diverticulosis, CAD with prior  PCI, diabetes, glaucoma and macular degeneration with blindness, who is seen in consult at the request of Dr. Ronnald Ramp to evaluate abdominal pain, weight loss and abnormal GI imaging.  1.  Lower abdominal pain/weight loss/abnormal CT scan of the colon/heme positive stool --we discussed his constellation of symptoms at length today.  His symptoms did not improve as I would have expected for diverticulitis alone.  He could have symptomatic diverticulosis but I also would like to exclude underlying malignancy.  I have recommended upper endoscopy and colonoscopy with monitored anesthesia care.    I do think that his procedures are higher than average risk which we discussed at length.  We discussed risks including cardiovascular decompensation with sedation, aspiration, infection, bleeding and perforation.  After discussing the risks with both he and his son they are agreeable and wished to proceed.  We will use a 2-day MiraLAX prep in an attempt to get an adequate prep given his age, severe diverticulosis, and the need of possible to have a complete colonoscopy given abnormality in the Snyder colon by CT.  2.  GERD --without symptoms currently.  He is on Nexium which he can continue at 20 mg a day   [id number], Timothy Snyder, [address],  [place] [id number]

## 2019-07-14 ENCOUNTER — Encounter: Payer: Self-pay | Admitting: Internal Medicine

## 2019-07-21 ENCOUNTER — Other Ambulatory Visit: Payer: Self-pay | Admitting: Internal Medicine

## 2019-07-21 DIAGNOSIS — I2510 Atherosclerotic heart disease of native coronary artery without angina pectoris: Secondary | ICD-10-CM

## 2019-07-21 DIAGNOSIS — Z794 Long term (current) use of insulin: Secondary | ICD-10-CM

## 2019-07-22 ENCOUNTER — Other Ambulatory Visit: Payer: Self-pay | Admitting: Internal Medicine

## 2019-07-22 DIAGNOSIS — E785 Hyperlipidemia, unspecified: Secondary | ICD-10-CM

## 2019-07-22 DIAGNOSIS — E118 Type 2 diabetes mellitus with unspecified complications: Secondary | ICD-10-CM

## 2019-07-22 DIAGNOSIS — I2510 Atherosclerotic heart disease of native coronary artery without angina pectoris: Secondary | ICD-10-CM

## 2019-07-26 ENCOUNTER — Other Ambulatory Visit: Payer: Self-pay

## 2019-07-26 ENCOUNTER — Other Ambulatory Visit (INDEPENDENT_AMBULATORY_CARE_PROVIDER_SITE_OTHER): Payer: PPO

## 2019-07-26 ENCOUNTER — Encounter: Payer: Self-pay | Admitting: Internal Medicine

## 2019-07-26 ENCOUNTER — Ambulatory Visit (AMBULATORY_SURGERY_CENTER): Payer: PPO | Admitting: Internal Medicine

## 2019-07-26 VITALS — BP 115/65 | HR 67 | Temp 96.2°F | Resp 9 | Ht 68.0 in | Wt 171.0 lb

## 2019-07-26 DIAGNOSIS — R1030 Lower abdominal pain, unspecified: Secondary | ICD-10-CM

## 2019-07-26 DIAGNOSIS — R634 Abnormal weight loss: Secondary | ICD-10-CM | POA: Diagnosis not present

## 2019-07-26 DIAGNOSIS — C183 Malignant neoplasm of hepatic flexure: Secondary | ICD-10-CM | POA: Diagnosis not present

## 2019-07-26 DIAGNOSIS — K6389 Other specified diseases of intestine: Secondary | ICD-10-CM | POA: Diagnosis not present

## 2019-07-26 DIAGNOSIS — R195 Other fecal abnormalities: Secondary | ICD-10-CM

## 2019-07-26 DIAGNOSIS — R109 Unspecified abdominal pain: Secondary | ICD-10-CM | POA: Diagnosis not present

## 2019-07-26 DIAGNOSIS — R933 Abnormal findings on diagnostic imaging of other parts of digestive tract: Secondary | ICD-10-CM | POA: Diagnosis not present

## 2019-07-26 LAB — CBC WITH DIFFERENTIAL/PLATELET
Basophils Absolute: 0 10*3/uL (ref 0.0–0.1)
Basophils Relative: 0.6 % (ref 0.0–3.0)
Eosinophils Absolute: 0.2 10*3/uL (ref 0.0–0.7)
Eosinophils Relative: 3 % (ref 0.0–5.0)
HCT: 37 % — ABNORMAL LOW (ref 39.0–52.0)
Hemoglobin: 12.5 g/dL — ABNORMAL LOW (ref 13.0–17.0)
Lymphocytes Relative: 40.4 % (ref 12.0–46.0)
Lymphs Abs: 2.2 10*3/uL (ref 0.7–4.0)
MCHC: 33.7 g/dL (ref 30.0–36.0)
MCV: 87.9 fl (ref 78.0–100.0)
Monocytes Absolute: 0.6 10*3/uL (ref 0.1–1.0)
Monocytes Relative: 10.8 % (ref 3.0–12.0)
Neutro Abs: 2.4 10*3/uL (ref 1.4–7.7)
Neutrophils Relative %: 45.2 % (ref 43.0–77.0)
Platelets: 177 10*3/uL (ref 150.0–400.0)
RBC: 4.21 Mil/uL — ABNORMAL LOW (ref 4.22–5.81)
RDW: 15.2 % (ref 11.5–15.5)
WBC: 5.3 10*3/uL (ref 4.0–10.5)

## 2019-07-26 LAB — COMPREHENSIVE METABOLIC PANEL
ALT: 16 U/L (ref 0–53)
AST: 18 U/L (ref 0–37)
Albumin: 3.8 g/dL (ref 3.5–5.2)
Alkaline Phosphatase: 106 U/L (ref 39–117)
BUN: 10 mg/dL (ref 6–23)
CO2: 30 mEq/L (ref 19–32)
Calcium: 8.8 mg/dL (ref 8.4–10.5)
Chloride: 101 mEq/L (ref 96–112)
Creatinine, Ser: 1.21 mg/dL (ref 0.40–1.50)
GFR: 56.33 mL/min — ABNORMAL LOW (ref >60.00)
Glucose, Bld: 101 mg/dL — ABNORMAL HIGH (ref 70–99)
Potassium: 3.7 mEq/L (ref 3.5–5.1)
Sodium: 136 mEq/L (ref 135–145)
Total Bilirubin: 0.7 mg/dL (ref 0.2–1.2)
Total Protein: 6.3 g/dL (ref 6.0–8.3)

## 2019-07-26 MED ORDER — SODIUM CHLORIDE 0.9 % IV SOLN: 500.0000 mL | Freq: Once | INTRAVENOUS | Status: DC

## 2019-07-26 NOTE — Op Note (Signed)
[facility] Patient Name: Timothy Snyder Procedure Date: 07/26/2019 2:19 PM MRN: [id number] Endoscopist: Jerene Bears , MD Age: 86 Referring MD:  Date of Birth: December 17, 1927 Gender: Male Account #: [id number] Procedure:                Upper GI endoscopy Indications:              Lower abdominal pain, Heme positive stool, Abnormal                            CT of the GI tract, Weight loss Medicines:                Monitored Anesthesia Care Procedure:                Pre-Anesthesia Assessment:                           - Prior to the procedure, a History and Physical                            was performed, and patient medications and                            allergies were reviewed. The patient's tolerance of                            previous anesthesia was also reviewed. The risks                            and benefits of the procedure and the sedation                            options and risks were discussed with the patient.                            All questions were answered, and informed consent                            was obtained. Prior Anticoagulants: The patient has                            taken no previous anticoagulant or antiplatelet                            agents. ASA Grade Assessment: III - A patient with                            severe systemic disease. After reviewing the risks                            and benefits, the patient was deemed in                            satisfactory condition to undergo the procedure.  After obtaining informed consent, the endoscope was                            passed under direct vision. Throughout the                            procedure, the patient's blood pressure, pulse, and                            oxygen saturations were monitored continuously. The                            Endoscope was introduced through the mouth, and                            advanced to the second  part of duodenum. The upper                            GI endoscopy was accomplished without difficulty.                            The patient tolerated the procedure well. Scope In: Scope Out: Findings:                 The examined esophagus was normal.                           A large, submucosal, non-circumferential mass with                            no bleeding and no stigmata of recent bleeding was                            found on the greater curvature of the gastric body.                            This could be submucosal in the gastric wall or                            extrinsic compression from adjacent structure.                           The examined duodenum was normal. Complications:            No immediate complications. Estimated Blood Loss:     Estimated blood loss: none. Impression:               - Normal esophagus.                           - Submucosal gastric mass versus extrinsic                            compression on the greater curvature of the gastric  body.                           - Normal examined duodenum.                           - No specimens collected. Recommendation:           - Patient has a contact number available for                            emergencies. The signs and symptoms of potential                            delayed complications were discussed with the                            patient. Return to normal activities tomorrow.                            Written discharge instructions were provided to the                            patient.                           - Resume previous diet.                           - Continue present medications.                           - See the other procedure note for documentation of                            additional recommendations. Jerene Bears, MD 07/26/2019 3:19:11 PM This report has been signed electronically.

## 2019-07-26 NOTE — Progress Notes (Addendum)
Pt is blind.  Pt's son, Timothy Snyder is his care partner and helper. maw  JB - Check-in DT - VS  Pt is not sure of family hx of GI cancers. maw

## 2019-07-26 NOTE — Progress Notes (Signed)
Called to room to assist during endoscopic procedure.  Patient ID and intended procedure confirmed with present staff. Received instructions for my participation in the procedure from the performing physician.  

## 2019-07-26 NOTE — Op Note (Signed)
[facility] Patient Name: Timothy Snyder Procedure Date: 07/26/2019 2:19 PM MRN: [id number] Endoscopist: Jerene Bears , MD Age: 86 Referring MD:  Date of Birth: 1927-04-13 Gender: Male Account #: [id number] Procedure:                Colonoscopy Indications:              Abdominal pain in the left lower quadrant, Heme                            positive stool, Abnormal CT of the GI tract with                            thickening diverticulitis versus mass at the                            hepatic flexure, Weight loss Medicines:                Monitored Anesthesia Care Procedure:                Pre-Anesthesia Assessment:                           - Prior to the procedure, a History and Physical                            was performed, and patient medications and                            allergies were reviewed. The patient's tolerance of                            previous anesthesia was also reviewed. The risks                            and benefits of the procedure and the sedation                            options and risks were discussed with the patient.                            All questions were answered, and informed consent                            was obtained. Prior Anticoagulants: The patient has                            taken no previous anticoagulant or antiplatelet                            agents. ASA Grade Assessment: III - A patient with                            severe systemic disease. After reviewing the risks  and benefits, the patient was deemed in                            satisfactory condition to undergo the procedure.                           After obtaining informed consent, the colonoscope                            was passed under direct vision. Throughout the                            procedure, the patient's blood pressure, pulse, and                            oxygen saturations were monitored  continuously. The                            Colonoscope was introduced through the anus with                            the intention of advancing to the cecum. The scope                            was advanced to the hepatic flexure before the                            procedure was aborted. Medications were given. The                            Colonoscope was introduced through the and advanced                            to the. The colonoscopy was performed without                            difficulty. The patient tolerated the procedure                            well. The quality of the bowel preparation was                            adequate. Scope In: 2:54:34 PM Scope Out: 3:13:57 PM Scope Withdrawal Time: 0 hours 13 minutes 3 seconds  Total Procedure Duration: 0 hours 19 minutes 23 seconds  Findings:                 The digital rectal exam was normal.                           An infiltrative partially obstructing mass was                            found at the hepatic flexure. The mass was  circumferential and could not be traversed with the                            pediatric colonoscope. No bleeding was present.                            This was biopsied multiple times with a cold                            forceps for histology. Area distally in 3 locations                            was tattooed with injections totalling 5 mL of Spot                            (carbon black).                           Many small and large-mouthed diverticula were found                            in the sigmoid colon, descending colon, transverse                            colon and hepatic flexure.                           Internal hemorrhoids were found during                            retroflexion. The hemorrhoids were small. Complications:            No immediate complications. Estimated Blood Loss:     Estimated blood loss was minimal. Impression:                - Likely malignant partially obstructing tumor at                            the hepatic flexure. Biopsied. Tattooed.                           - Diverticulosis in the sigmoid colon, in the                            descending colon, in the transverse colon and at                            the hepatic flexure.                           - Internal hemorrhoids.                           - Colon proximal to hepatic flexure mass not seen  today. Recommendation:           - Patient has a contact number available for                            emergencies. The signs and symptoms of potential                            delayed complications were discussed with the                            patient. Return to normal activities tomorrow.                            Written discharge instructions were provided to the                            patient.                           - Resume previous diet.                           - Continue present medications.                           - Await pathology results.                           - Repeat CBC, CMP today.                           - CT scan chest/abdomen/pelvis.                           - Anticipate oncology and surgical evaluations, but                            await pathology results. Jerene Bears, MD 07/26/2019 3:26:16 PM This report has been signed electronically.

## 2019-07-26 NOTE — Progress Notes (Signed)
pt tolerated well. VSS. awake and to recovery. Report given to RN. Bite block inserted and removed without trauma. 

## 2019-07-26 NOTE — Patient Instructions (Addendum)
YOU HAD AN ENDOSCOPIC PROCEDURE TODAY AT THE [facility]:   Refer to the procedure report that was given to you for any specific questions about what was found during the examination.  If the procedure report does not answer your questions, please call your gastroenterologist to clarify.  If you requested that your care partner not be given the details of your procedure findings, then the procedure report has been included in a sealed envelope for you to review at your convenience later.  YOU SHOULD EXPECT: Some feelings of bloating in the abdomen. Passage of more gas than usual.  Walking can help get rid of the air that was put into your GI tract during the procedure and reduce the bloating. If you had a lower endoscopy (such as a colonoscopy or flexible sigmoidoscopy) you may notice spotting of blood in your stool or on the toilet paper. If you underwent a bowel prep for your procedure, you may not have a normal bowel movement for a few days.  Please Note:  You might notice some irritation and congestion in your nose or some drainage.  This is from the oxygen used during your procedure.  There is no need for concern and it should clear up in a day or so.  SYMPTOMS TO REPORT IMMEDIATELY:   Following lower endoscopy (colonoscopy or flexible sigmoidoscopy):  Excessive amounts of blood in the stool  Significant tenderness or worsening of abdominal pains  Swelling of the abdomen that is new, acute  Fever of 100F or higher   Following upper endoscopy (EGD)  Vomiting of blood or coffee ground material  New chest pain or pain under the shoulder blades  Painful or persistently difficult swallowing  New shortness of breath  Fever of 100F or higher  Black, tarry-looking stools  For urgent or emergent issues, a gastroenterologist can be reached at any hour by calling [phone]. Do not use MyChart messaging for urgent concerns.    DIET:  We do recommend a small meal at first, but  then you may proceed to your regular diet.  Drink plenty of fluids but you should avoid alcoholic beverages for 24 hours.  ACTIVITY:  You should plan to take it easy for the rest of today and you should NOT DRIVE or use heavy machinery until tomorrow (because of the sedation medicines used during the test).    FOLLOW UP: Our staff will call the number listed on your records 48-72 hours following your procedure to check on you and address any questions or concerns that you may have regarding the information given to you following your procedure. If we do not reach you, we will leave a message.  We will attempt to reach you two times.  During this call, we will ask if you have developed any symptoms of COVID 19. If you develop any symptoms (ie: fever, flu-like symptoms, shortness of breath, cough etc.) before then, please call [phone].  If you test positive for Covid 19 in the 2 weeks post procedure, please call and report this information to us.    If any biopsies were taken you will be contacted by phone or by letter within the next 1-3 weeks.  Please call us at [phone] if you have not heard about the biopsies in 3 weeks.    SIGNATURES/CONFIDENTIALITY: You and/or your care partner have signed paperwork which will be entered into your electronic medical record.  These signatures attest to the fact that that the information above on   your After Visit Summary has been reviewed and is understood.  Full responsibility of the confidentiality of this discharge information lies with you and/or your care-partner. 

## 2019-07-27 ENCOUNTER — Telehealth: Payer: Self-pay

## 2019-07-27 ENCOUNTER — Other Ambulatory Visit: Payer: Self-pay

## 2019-07-27 DIAGNOSIS — K6389 Other specified diseases of intestine: Secondary | ICD-10-CM

## 2019-07-27 NOTE — Telephone Encounter (Signed)
Pt scheduled for CT of CAP at [facility] 6/15@4pm , pt to arrive there at 3:45pm. Pt to be npo after 12noon, drink bottle 1 of contrast at 2pm, bottle 2 at 3pm. Pts son aware of appt and instructions.

## 2019-07-28 ENCOUNTER — Other Ambulatory Visit: Payer: Self-pay

## 2019-07-28 ENCOUNTER — Telehealth: Payer: Self-pay

## 2019-07-28 DIAGNOSIS — C801 Malignant (primary) neoplasm, unspecified: Secondary | ICD-10-CM

## 2019-07-28 DIAGNOSIS — K6389 Other specified diseases of intestine: Secondary | ICD-10-CM

## 2019-07-28 NOTE — Progress Notes (Signed)
Spoke with patient's son regarding referral we received from Dr. Hilarie Fredrickson with recent diagnosis of colon cancer.  The patient is scheduled for CT CAP on 6/15 and has been referred to [facility] - which they do not have an appointment yet.  I have scheduled him to be seen on Thursday 08/19/2019 at 3 pm. after scan and surgical consults.  His son is in agreement with this plan.  He was explained my role as nurse navigator and he was given my direct phone number and encouraged to call if questions or concerns arise.

## 2019-07-28 NOTE — Telephone Encounter (Signed)
°  Follow up Call-  Call back number 07/26/2019  Post procedure Call Back phone  # [phone]  Permission to leave phone message No  Some recent data might be hidden     Patient questions:  Do you have a fever, pain , or abdominal swelling? No. Pain Score  0 *  Have you tolerated food without any problems? Yes.    Have you been able to return to your normal activities? Yes.    Do you have any questions about your discharge instructions: Diet   No. Medications  No. Follow up visit  No.  Do you have questions or concerns about your Care? No.  Actions: * If pain score is 4 or above: 1. No action needed, pain <4.Have you developed a fever since your procedure? no  2.   Have you had an respiratory symptoms (SOB or cough) since your procedure? no  3.   Have you tested positive for COVID 19 since your procedure no  4.   Have you had any family members/close contacts diagnosed with the COVID 19 since your procedure?  no   If yes to any of these questions please route to Joylene John, RN and Erenest Rasher, RN

## 2019-07-29 ENCOUNTER — Other Ambulatory Visit: Payer: Self-pay

## 2019-07-29 MED ORDER — NITROGLYCERIN 0.4 MG SL SUBL: 0.4000 mg | SUBLINGUAL_TABLET | SUBLINGUAL | 11 refills | Status: AC | PRN

## 2019-07-31 ENCOUNTER — Encounter: Payer: Self-pay | Admitting: Internal Medicine

## 2019-08-02 ENCOUNTER — Other Ambulatory Visit: Payer: Self-pay | Admitting: Internal Medicine

## 2019-08-02 DIAGNOSIS — G2581 Restless legs syndrome: Secondary | ICD-10-CM

## 2019-08-02 DIAGNOSIS — I214 Non-ST elevation (NSTEMI) myocardial infarction: Secondary | ICD-10-CM

## 2019-08-02 DIAGNOSIS — I10 Essential (primary) hypertension: Secondary | ICD-10-CM

## 2019-08-02 DIAGNOSIS — I2510 Atherosclerotic heart disease of native coronary artery without angina pectoris: Secondary | ICD-10-CM

## 2019-08-02 MED ORDER — CLONAZEPAM 0.5 MG PO TABS: ORAL_TABLET | ORAL | 1 refills | Status: AC

## 2019-08-02 MED ORDER — CARVEDILOL 3.125 MG PO TABS: ORAL_TABLET | ORAL | 1 refills | Status: DC

## 2019-08-03 ENCOUNTER — Ambulatory Visit (HOSPITAL_COMMUNITY)
Admission: RE | Admit: 2019-08-03 | Discharge: 2019-08-03 | Disposition: A | Discharge status: Home / Self Care | Payer: PPO | Source: Ambulatory Visit | Attending: Internal Medicine | Admitting: Internal Medicine

## 2019-08-03 ENCOUNTER — Other Ambulatory Visit: Payer: Self-pay

## 2019-08-03 ENCOUNTER — Encounter (HOSPITAL_COMMUNITY): Payer: Self-pay

## 2019-08-03 DIAGNOSIS — R918 Other nonspecific abnormal finding of lung field: Secondary | ICD-10-CM | POA: Diagnosis not present

## 2019-08-03 DIAGNOSIS — C189 Malignant neoplasm of colon, unspecified: Secondary | ICD-10-CM | POA: Diagnosis not present

## 2019-08-03 DIAGNOSIS — K6389 Other specified diseases of intestine: Secondary | ICD-10-CM | POA: Insufficient documentation

## 2019-08-03 MED ORDER — SODIUM CHLORIDE (PF) 0.9 % IJ SOLN
INTRAMUSCULAR | Status: AC
  Filled 2019-08-03: qty 50

## 2019-08-03 MED ORDER — IOHEXOL 300 MG/ML  SOLN
100.0000 mL | Freq: Once | INTRAMUSCULAR | Status: AC | PRN
  Administered 2019-08-03: 100 mL via INTRAVENOUS

## 2019-08-04 ENCOUNTER — Telehealth: Payer: Self-pay | Admitting: *Deleted

## 2019-08-04 DIAGNOSIS — C189 Malignant neoplasm of colon, unspecified: Secondary | ICD-10-CM | POA: Diagnosis not present

## 2019-08-04 NOTE — Telephone Encounter (Signed)
   [facility] Pre-operative Risk Assessment    HEARTCARE STAFF: - Please ensure there is not already an duplicate clearance open for this procedure. - Under Visit Info/Reason for Call, type in Other and utilize the format Clearance MM/DD/YY or Clearance TBD. Do not use dashes or single digits. - If request is for dental extraction, please clarify the # of teeth to be extracted.  Request for surgical clearance:  1. What type of surgery is being performed? COLO-RECTAL SURGERY  2. When is this surgery scheduled? TBD  3. What type of clearance is required (medical clearance vs. Pharmacy clearance to hold med vs. Both)? BOTH  4. Are there any medications that need to be held prior to surgery and how long? ASPIRIN-THEY NEED DIRECTION  5. Practice name and name of physician performing surgery? [facility]   6. What is the office phone number?  [phone]   7.   What is the office fax number? [phone]  8.   Anesthesia type (None, local, MAC, general) ? GENERAL   Fredia Beets 08/04/2019, 4:44 PM  _________________________________________________________________   (provider comments below)

## 2019-08-05 ENCOUNTER — Other Ambulatory Visit: Payer: Self-pay | Admitting: Surgery

## 2019-08-05 ENCOUNTER — Other Ambulatory Visit (HOSPITAL_COMMUNITY): Payer: Self-pay | Admitting: Surgery

## 2019-08-05 DIAGNOSIS — C189 Malignant neoplasm of colon, unspecified: Secondary | ICD-10-CM

## 2019-08-05 NOTE — Telephone Encounter (Signed)
   Primary Cardiologist: Peter Martinique, MD  Chart reviewed as part of pre-operative protocol coverage. Given past medical history and time since last visit, based on ACC/AHA guidelines, Timothy Snyder would be at acceptable risk for the planned procedure without further cardiovascular testing.   Due to his cardiac history we would like him to remain on his aspirin for his surgery.  I will route this recommendation to the requesting party via Epic fax function and remove from pre-op pool.  Please call with questions.  Jossie Ng. Melisha Eggleton NP-C    08/05/2019, 8:45 AM [place] [address] Office [phone] Fax [phone]

## 2019-08-05 NOTE — Telephone Encounter (Signed)
I think he should remain on ASA for his surgery.  Razan Siler Martinique MD, [facility]

## 2019-08-07 ENCOUNTER — Encounter: Payer: Self-pay | Admitting: Internal Medicine

## 2019-08-07 DIAGNOSIS — R600 Localized edema: Secondary | ICD-10-CM

## 2019-08-07 DIAGNOSIS — I10 Essential (primary) hypertension: Secondary | ICD-10-CM

## 2019-08-09 MED ORDER — TORSEMIDE 10 MG PO TABS: 10.0000 mg | ORAL_TABLET | Freq: Every day | ORAL | 1 refills | Status: AC

## 2019-08-13 ENCOUNTER — Other Ambulatory Visit: Payer: Self-pay

## 2019-08-13 ENCOUNTER — Ambulatory Visit (HOSPITAL_COMMUNITY)
Admission: RE | Admit: 2019-08-13 | Discharge: 2019-08-13 | Disposition: A | Discharge status: Home / Self Care | Payer: PPO | Source: Ambulatory Visit | Attending: Surgery | Admitting: Surgery

## 2019-08-13 DIAGNOSIS — K8020 Calculus of gallbladder without cholecystitis without obstruction: Secondary | ICD-10-CM | POA: Insufficient documentation

## 2019-08-13 DIAGNOSIS — R918 Other nonspecific abnormal finding of lung field: Secondary | ICD-10-CM | POA: Insufficient documentation

## 2019-08-13 DIAGNOSIS — I700 Atherosclerosis of aorta: Secondary | ICD-10-CM | POA: Insufficient documentation

## 2019-08-13 DIAGNOSIS — C189 Malignant neoplasm of colon, unspecified: Secondary | ICD-10-CM | POA: Insufficient documentation

## 2019-08-13 DIAGNOSIS — K5730 Diverticulosis of large intestine without perforation or abscess without bleeding: Secondary | ICD-10-CM | POA: Diagnosis not present

## 2019-08-13 DIAGNOSIS — C183 Malignant neoplasm of hepatic flexure: Secondary | ICD-10-CM | POA: Diagnosis not present

## 2019-08-13 LAB — GLUCOSE, CAPILLARY: Glucose-Capillary: 121 mg/dL — ABNORMAL HIGH (ref 70–99)

## 2019-08-13 MED ORDER — FLUDEOXYGLUCOSE F - 18 (FDG) INJECTION
9.8000 | Freq: Once | INTRAVENOUS | Status: AC
  Administered 2019-08-13: 9.8 via INTRAVENOUS

## 2019-08-16 NOTE — Progress Notes (Signed)
Timothy Snyder   [phone] Fax:([phone] Golden Note   Patient Care Team: Janith Lima, MD as PCP - General (Internal Medicine) Martinique, Peter M, MD as PCP - Cardiology (Cardiology) Calvert Cantor, MD (Ophthalmology) Bond, Tracie Harrier, MD (Ophthalmology) Jonnie Finner, RN as Oncology Nurse Navigator Truitt Merle, MD as Consulting Physician (Hematology)  Date of Service:  08/19/2019   CHIEF COMPLAINTS/PURPOSE OF CONSULTATION:  Newly Diagnosed Colon cancer   REFERRING PHYSICIAN:  Dr Dema Severin  Oncology History Overview Note  Cancer Staging No matching staging information was found for the patient.    Cancer of right colon ([place])  05/28/2019 Imaging   CT AP W contrast 05/28/19  IMPRESSION: 1. Diffuse diverticular disease of the colon. Focal area of mild wall thickening and possible inflammatory change at the hepatic flexure suggestive of mild diverticulitis though inflammatory colon mass is also possible. Correlation with direct visualization is suggested. 2. Irregular nodular consolidation in the medial left base with additional 7 mm right middle lobe lung nodule. Non-contrast chest CT at 3-6 months is recommended. If the nodules are stable at time of repeat CT, then future CT at 18-24 months (from today's scan) is considered optional for low-risk patients, but is recommended for high-risk patients. This recommendation follows the consensus statement: Guidelines for Management of Incidental Pulmonary Nodules Detected on CT Images: From the Fleischner Society 2017; Radiology 2017; 284:228-243. 3. Nonspecific haziness/possible inflammation at the porta hepatis without discernible cause on current CT. Could correlate with pancreas enzymes to exclude pancreatitis. Possible gallstones in a contracted gallbladder though no other inflammatory features involving the gallbladder.   07/26/2019 Procedure   Upper Endoscopy by Dr Hilarie Fredrickson  07/26/19 IMPRESSION - Normal esophagus. - Submucosal gastric mass versus extrinsic compression on the greater curvature of the gastric body. - Normal examined duodenum. - No specimens collected.   Colonosocpy by Dr Hilarie Fredrickson 07/26/19  IMPRESSION - Likely malignant partially obstructing tumor at the hepatic flexure. Biopsied. Tattooed. - Diverticulosis in the sigmoid colon, in the descending colon, in the transverse colon and at the hepatic flexure. - Internal hemorrhoids. - Colon proximal to hepatic flexure mass not seen today.    07/26/2019 Initial Biopsy   Diagnosis 07/26/19 Hepatic Flexure Biopsy, mass - ADENOCARCINOMA. - SEE COMMENT. Microscopic Comment Dr. Vic Ripper has reviewed the case and concurs with this interpretation. MMR by [facility] testing will be performed and the results reported separately. Dr. Hilarie Fredrickson was paged on 07/27/19.   08/03/2019 Imaging   CT CAP W contrast 08/03/19  IMPRESSION: 1. Persistent circumferential colon narrowing at the hepatic flexure corresponds to mass identified on colonoscopy. No high-grade obstruction. 2. No evidence of metastatic adenopathy in the abdomen pelvis. No liver metastasis. 3. Focus of peripheral consolidation at the LEFT lung apex is focal and asymmetric with the RIGHT. Lesion has an appearance more typical of bronchogenic carcinoma than metastatic colorectal carcinoma. Differential also includes benign inflammatory or infectious consolidation. An FDG PET scan may help determine malignant potential.    08/13/2019 Imaging   PET 08/13/19  IMPRESSION: 1. Diffuse colorectal hypermetabolism, including at the site of annular wall thickening in the hepatic flexure of the colon, compatible with known primary colonic malignancy. 2. No hypermetabolic locoregional adenopathy or liver metastases. 3. Intensely hypermetabolic 2.8 cm deep subcutaneous soft tissue mass in the posterior lower left neck near the midline adjacent to the tip of the C7  spinous process, suspicious for metastatic disease. 4. Similar hypermetabolic 1.0 cm deep subcutaneous soft  tissue mass in the lateral right posterior chest wall at the level of the lower scapula, also suspicious for metastatic disease. 5. Mild hypermetabolism (max SUV 3.1) associated with irregular 1.9 cm subpleural apical left upper lobe pulmonary nodule, cannot exclude malignancy, including possibly a metachronous primary bronchogenic carcinoma. 6. Intense bilateral hilar nodal hypermetabolism, suspicious for metastatic disease. 7. Mildly hypermetabolic nonenlarged left inguinal lymph node, metastasis not excluded. 8. Chronic findings include: Aortic Atherosclerosis (ICD10-I70.0). Cholelithiasis. Marked diffuse colonic diverticulosis.   08/19/2019 Initial Diagnosis   Cancer of right colon ([facility])      HISTORY OF PRESENTING ILLNESS:  Timothy Snyder 86 y.o. male is a here because of newly diagnosed colon cancer. The patient was referred by Dr Dema Severin. The patient presents to the clinic today accompanied by son. The son is hard of hearing.   He notes prior to colonoscopy he was having a lot of stomach gas and bloating. He was having abdominal pain for 6 months. He then had colonoscopy which showed adenocarcinoma at hepatic flexure. He notes his pain is intermittent and duration varies from minutes to hours. He has taken tylenol for his pain but not regularly. He notes his BM are fair with liquid stools per his son. His son notes his stools have been indicated as liquid for the past 2 years, no blood has been indicated. His weight is mostly stable. He notes rare occasions of coughing due to liquid going down trachea.  Socially he lives alone in senior independent living. He has 4 children, 2 of which lives in town. He is near blind from macular degeneration and left eye glaucoma in 1981 has caused decreasing vision since. He ambulates with cane and have walker but does not use it. He stopped  driving in early 7425. His son prepares all his meal and brings his groceries. He has not had any recent falls. He smoked a pipes and cigars for 6 years, quit in 1961. He does not drink alcohol. He is a retired Company secretary.  He has a PMHx of DM and he is on HTN medications. On 09/20/14 he had a MI. He also has arthritis. His father had testicular cancer.    REVIEW OF SYSTEMS:    Constitutional: Denies fevers, chills or abnormal night sweats Eyes: Denies blurriness of vision, double vision or watery eyes Ears, nose, mouth, throat, and face: Denies mucositis or sore throat Respiratory: Denies cough, dyspnea or wheezes Cardiovascular: Denies palpitation, chest discomfort or lower extremity swelling Gastrointestinal:  Denies nausea, heartburn (+) Abdominal gas and bloating (+) Intermittent Abdominal pain (+) Liquid BMs.  Skin: Denies abnormal skin rashes Lymphatics: Denies new lymphadenopathy or easy bruising Neurological:Denies numbness, tingling or new weaknesses Behavioral/Psych: Mood is stable, no new changes  All other systems were reviewed with the patient and are negative.  MEDICAL HISTORY:  Past Medical History:  Diagnosis Date  . Actinic keratosis   . ARTHRITIS   . Arthritis   . BPH (benign prostatic hyperplasia)   . CAD (coronary artery disease)    cath 09/21/2014 1v dx, 99% mid LAD treated with DES, 50% prox to mid LAD.  Marland Kitchen Cataract    bil cataracts removed  . Dementia ([place])   . DIABETES MELLITUS, TYPE II   . GERD (gastroesophageal reflux disease)   . Glaucoma   . GLUCOMA   . HYPERTENSION   . Macular degeneration    L>R vision loss, legally blind  . Myocardial infarction [facility])    Sep 20, 2014  . Restless  leg syndrome     SURGICAL HISTORY: Past Surgical History:  Procedure Laterality Date  . CARDIAC CATHETERIZATION N/A 09/21/2014   Procedure: Left Heart Cath and Coronary Angiography;  Surgeon: Peter M Martinique, MD;  Location: [facility];  Service: Cardiovascular;   Laterality: N/A;  . CARDIAC CATHETERIZATION  09/21/2014   Procedure: Coronary Stent Intervention;  Surgeon: Peter M Martinique, MD;  Location: [facility];  Service: Cardiovascular;;  . cataracts bil removed    . COLONOSCOPY    . CORONARY ANGIOPLASTY WITH STENT PLACEMENT     stent x1  . NO PAST SURGERIES      SOCIAL HISTORY: Social History   Socioeconomic History  . Marital status: Widowed    Spouse name: Not on file  . Number of children: 4  . Years of education: Not on file  . Highest education level: Not on file  Occupational History  . Occupation: retired Database administrator   Tobacco Use  . Smoking status: Former Smoker    Years: 10.00    Types: Pipe, Cigars    Quit date: 02/19/1959    Years since quitting: 60.5  . Smokeless tobacco: Never Used  Vaping Use  . Vaping Use: Never used  Substance and Sexual Activity  . Alcohol use: No  . Drug use: No  . Sexual activity: Not on file  Other Topics Concern  . Not on file  Social History Narrative   Single, widowed 06/2006. Moved to [facility] from [place] 06/2009 to be near son/dtr. Lives alone & no driving, legally blind due to macular degeneration   Social Determinants of Health   Financial Resource Strain:   . Difficulty of Paying Living Expenses:   Food Insecurity:   . Worried About Charity fundraiser in the Last Year:   . Arboriculturist in the Last Year:   Transportation Needs:   . Film/video editor (Medical):   Marland Kitchen Lack of Transportation (Non-Medical):   Physical Activity:   . Days of Exercise per Week:   . Minutes of Exercise per Session:   Stress:   . Feeling of Stress :   Social Connections:   . Frequency of Communication with Friends and Family:   . Frequency of Social Gatherings with Friends and Family:   . Attends Religious Services:   . Active Member of Clubs or Organizations:   . Attends Archivist Meetings:   Marland Kitchen Marital Status:   Intimate Partner Violence:   . Fear of Current or Ex-Partner:   .  Emotionally Abused:   Marland Kitchen Physically Abused:   . Sexually Abused:     FAMILY HISTORY: Family History  Problem Relation Age of Onset  . Coronary artery disease Mother 31       MI  . Heart failure Mother   . Cancer Father        testicular cancer     ALLERGIES:  is allergic to brinzolamide-brimonidine and other.  MEDICATIONS:  Current Outpatient Medications  Medication Sig Dispense Refill  . alfuzosin (UROXATRAL) 10 MG 24 hr tablet Take 1 tablet (10 mg total) by mouth daily with breakfast. 90 tablet 1  . aspirin 81 MG tablet Take 81 mg by mouth daily.    . carvedilol (COREG) 3.125 MG tablet TAKE 1 TABLET(3.125 MG) BY MOUTH TWICE DAILY WITH A MEAL 180 tablet 1  . clonazePAM (KLONOPIN) 0.5 MG tablet TAKE 1 TABLET(0.5 MG) BY MOUTH AT BEDTIME 90 tablet 1  . esomeprazole (NEXIUM) 20  MG capsule Take 20 mg by mouth daily at 12 noon.    . finasteride (PROSCAR) 5 MG tablet Take 1 tablet (5 mg total) by mouth daily. 90 tablet 1  . glucose blood (PRODIGY NO CODING BLOOD GLUC) test strip 1 each by Other route daily. Use as instructed 100 each 1  . lisinopril (ZESTRIL) 2.5 MG tablet TAKE 1 TABLET(2.5 MG) BY MOUTH DAILY 90 tablet 1  . metFORMIN (GLUCOPHAGE-XR) 750 MG 24 hr tablet TAKE 1 TABLET BY MOUTH EVERY DAY WITH BREAKFAST 90 tablet 1  . nitroGLYCERIN (NITROSTAT) 0.4 MG SL tablet Place 1 tablet (0.4 mg total) under the tongue every 5 (five) minutes x 3 doses as needed for chest pain. 25 tablet 11  . pravastatin (PRAVACHOL) 20 MG tablet TAKE 1 TABLET(20 MG) BY MOUTH DAILY 90 tablet 1  . PRODIGY LANCETS 26G MISC 1 each by Does not apply route daily. Dx code 250.00 100 each 1  . timolol (TIMOPTIC) 0.5 % ophthalmic solution Place 1 drop into both eyes 2 (two) times daily.  2  . torsemide (DEMADEX) 10 MG tablet Take 1 tablet (10 mg total) by mouth daily. 90 tablet 1  . triamcinolone cream (KENALOG) 0.1 % APPLY TO THE AFFECTED AREA TWICE DAILY FOR 7 DAYS     No current facility-administered  medications for this visit.    PHYSICAL EXAMINATION: ECOG PERFORMANCE STATUS: 2 - Symptomatic, <50% confined to bed  Vitals:   08/19/19 1458  BP: (!) 109/51  Pulse: 71  Resp: 18  Temp: 97.7 F (36.5 C)  SpO2: 97%   Filed Weights   08/19/19 1458  Weight: 171 lb (77.6 kg)    GENERAL:alert, no distress and comfortable SKIN: skin color, texture, turgor are normal, no rashes or significant lesions  NECK: supple, thyroid normal size, non-tender, without nodularity LYMPH:  no palpable lymphadenopathy in the cervical, axillary  LUNGS: clear to auscultation and percussion with normal breathing effort HEART: regular rate & rhythm and no murmurs and no lower extremity edema ABDOMEN:abdomen soft, non-tender and normal bowel sounds Musculoskeletal:no cyanosis of digits and no clubbing  NEURO: alert & oriented x 3 with fluent speech, no focal motor/sensory deficits Exam done in wheelchair   LABORATORY DATA:  I have reviewed the data as listed CBC Latest Ref Rng & Units 07/26/2019 05/20/2019 08/07/2017  WBC 4.0 - 10.5 K/uL 5.3 8.0 8.6  Hemoglobin 13.0 - 17.0 g/dL 12.5(L) 13.4 15.2  Hematocrit 39 - 52 % 37.0(L) 39.4 44.3  Platelets 150 - 400 K/uL 177.0 167.0 203.0    CMP Latest Ref Rng & Units 07/26/2019 05/20/2019 12/29/2017  Glucose 70 - 99 mg/dL 101(H) 133(H) 168(H)  BUN 6 - 23 mg/dL 10 26(H) 26(H)  Creatinine 0.40 - 1.50 mg/dL 1.21 1.34 1.66(H)  Sodium 135 - 145 mEq/L 136 136 138  Potassium 3.5 - 5.1 mEq/L 3.7 3.9 4.2  Chloride 96 - 112 mEq/L 101 101 101  CO2 19 - 32 mEq/L 30 29 28   Calcium 8.4 - 10.5 mg/dL 8.8 8.9 8.8  Total Protein 6.0 - 8.3 g/dL 6.3 6.1 -  Total Bilirubin 0.2 - 1.2 mg/dL 0.7 0.6 -  Alkaline Phos 39 - 117 U/L 106 101 -  AST 0 - 37 U/L 18 20 -  ALT 0 - 53 U/L 16 17 -     RADIOGRAPHIC STUDIES: I have personally reviewed the radiological images as listed and agreed with the findings in the report. CT CHEST W CONTRAST  Result Date: 08/04/2019 CLINICAL DATA:  Colon mass.  Diagnosis of colon cancer July 26, 2019. EXAM: CT CHEST, ABDOMEN, AND PELVIS WITH CONTRAST TECHNIQUE: Multidetector CT imaging of the chest, abdomen and pelvis was performed following the standard protocol during bolus administration of intravenous contrast. CONTRAST:  172m OMNIPAQUE IOHEXOL 300 MG/ML  SOLN COMPARISON:  CT 05/28/2019, colonoscopy report 07/26/2019. FINDINGS: CT CHEST FINDINGS Cardiovascular: Coronary artery calcification and aortic atherosclerotic calcification. Mediastinum/Nodes: No axillary supraclavicular adenopathy. No mediastinal hilar adenopathy no pericardial effusion. Small hiatal hernia. There are calcified mediastinal lymph nodes. Lungs/Pleura: Masslike pleuroparenchymal thickening at the LEFT lung apex measures 2.2 cm. Similar focus of consolidation at the LEFT lung base medially measures 1.7 cm in similar to prior CT exam in April. There is diffuse subpleural reticulation throughout the lungs. Airways normal. Musculoskeletal: No aggressive osseous lesion. CT ABDOMEN AND PELVIS FINDINGS Hepatobiliary: Scattered calcified granulomata within the liver. Suspicious lesion. Gallbladder collapsed around a calculus. Common bile duct normal. Pancreas: Pancreas is normal. No ductal dilatation. No pancreatic inflammation. Spleen: Multiple granulomata within the spleen. Adrenals/urinary tract: Adrenal glands and kidneys are normal. The ureters and bladder normal. Stomach/Bowel: Stomach, small bowel, appendix, and cecum are normal. Ascending colon is normal. There is circumferential constriction of the colon at the hepatic flexure over a 2.8 cm segment (image 68/2). This is persistent from comparison CT 05/28/2019 and corresponds to the abnormal colonoscopy findings. No high-grade obstruction in the small bowel or colon proximal to this lesion. The distal transverse colon is normal. There is extensive diverticular disease of the descending colon sigmoid colon without acute inflammation.  Rectum normal. Vascular/Lymphatic: Abdominal aorta is normal caliber. There is no retroperitoneal or periportal lymphadenopathy. No pelvic lymphadenopathy. Reproductive: Prostate unremarkable Other: No free fluid. Musculoskeletal: Sclerotic lesion adjacent to the endplate of the T5 vertebral body (image 105/5) is favored benign. IMPRESSION: 1. Persistent circumferential colon narrowing at the hepatic flexure corresponds to mass identified on colonoscopy. No high-grade obstruction. 2. No evidence of metastatic adenopathy in the abdomen pelvis. No liver metastasis. 3. Focus of peripheral consolidation at the LEFT lung apex is focal and asymmetric with the RIGHT. Lesion has an appearance more typical of bronchogenic carcinoma than metastatic colorectal carcinoma. Differential also includes benign inflammatory or infectious consolidation. An FDG PET scan may help determine malignant potential. Electronically Signed   By: SSuzy BouchardM.D.   On: 08/04/2019 10:17   CT Abdomen Pelvis W Contrast  Result Date: 08/04/2019 CLINICAL DATA:  Colon mass.  Diagnosis of colon cancer July 26, 2019. EXAM: CT CHEST, ABDOMEN, AND PELVIS WITH CONTRAST TECHNIQUE: Multidetector CT imaging of the chest, abdomen and pelvis was performed following the standard protocol during bolus administration of intravenous contrast. CONTRAST:  1075mOMNIPAQUE IOHEXOL 300 MG/ML  SOLN COMPARISON:  CT 05/28/2019, colonoscopy report 07/26/2019. FINDINGS: CT CHEST FINDINGS Cardiovascular: Coronary artery calcification and aortic atherosclerotic calcification. Mediastinum/Nodes: No axillary supraclavicular adenopathy. No mediastinal hilar adenopathy no pericardial effusion. Small hiatal hernia. There are calcified mediastinal lymph nodes. Lungs/Pleura: Masslike pleuroparenchymal thickening at the LEFT lung apex measures 2.2 cm. Similar focus of consolidation at the LEFT lung base medially measures 1.7 cm in similar to prior CT exam in April. There is  diffuse subpleural reticulation throughout the lungs. Airways normal. Musculoskeletal: No aggressive osseous lesion. CT ABDOMEN AND PELVIS FINDINGS Hepatobiliary: Scattered calcified granulomata within the liver. Suspicious lesion. Gallbladder collapsed around a calculus. Common bile duct normal. Pancreas: Pancreas is normal. No ductal dilatation. No pancreatic inflammation. Spleen: Multiple granulomata within the spleen. Adrenals/urinary tract: Adrenal glands and kidneys  are normal. The ureters and bladder normal. Stomach/Bowel: Stomach, small bowel, appendix, and cecum are normal. Ascending colon is normal. There is circumferential constriction of the colon at the hepatic flexure over a 2.8 cm segment (image 68/2). This is persistent from comparison CT 05/28/2019 and corresponds to the abnormal colonoscopy findings. No high-grade obstruction in the small bowel or colon proximal to this lesion. The distal transverse colon is normal. There is extensive diverticular disease of the descending colon sigmoid colon without acute inflammation. Rectum normal. Vascular/Lymphatic: Abdominal aorta is normal caliber. There is no retroperitoneal or periportal lymphadenopathy. No pelvic lymphadenopathy. Reproductive: Prostate unremarkable Other: No free fluid. Musculoskeletal: Sclerotic lesion adjacent to the endplate of the T5 vertebral body (image 105/5) is favored benign. IMPRESSION: 1. Persistent circumferential colon narrowing at the hepatic flexure corresponds to mass identified on colonoscopy. No high-grade obstruction. 2. No evidence of metastatic adenopathy in the abdomen pelvis. No liver metastasis. 3. Focus of peripheral consolidation at the LEFT lung apex is focal and asymmetric with the RIGHT. Lesion has an appearance more typical of bronchogenic carcinoma than metastatic colorectal carcinoma. Differential also includes benign inflammatory or infectious consolidation. An FDG PET scan may help determine malignant  potential. Electronically Signed   By: Suzy Bouchard M.D.   On: 08/04/2019 10:17   NM PET Image Initial (PI) Skull Base To Thigh  Result Date: 08/13/2019 CLINICAL DATA:  Initial treatment strategy for newly diagnosed hepatic flexure colon cancer. EXAM: NUCLEAR MEDICINE PET SKULL BASE TO THIGH TECHNIQUE: 9.8 mCi F-18 FDG was injected intravenously. Full-ring PET imaging was performed from the skull base to thigh after the radiotracer. CT data was obtained and used for attenuation correction and anatomic localization. Fasting blood glucose: 123 mg/dl COMPARISON:  08/03/2019 CT chest, abdomen and pelvis. FINDINGS: Mediastinal blood pool activity: SUV max 2.3 Liver activity: SUV max NA NECK: No hypermetabolic lymph nodes in the neck. Hypermetabolic 2.8 x 1.3 cm deep subcutaneous soft tissue mass in the posterior lower neck just to the left of midline with max SUV 19.4 (series 4/image 41), adjacent to the tip of the C7 spinous process. Incidental CT findings: none CHEST: Mildly hypermetabolic irregular 1.9 x 1.6 cm subpleural nodule in the apical left upper lobe with max SUV 3.1 (series 8/image 6). No additional hypermetabolic pulmonary findings. Nodular 1.9 cm subpleural medial basilar left lower lobe opacity (series 8/image 43) is non hypermetabolic and presumably benign. Right upper lobe 0.5 cm solid pulmonary nodule (series 8/image 27), below PET resolution. Bilateral hilar nodal hypermetabolism with max SUV 13.3 on the left and max SUV 11.3 in the right. No hypermetabolic axillary or mediastinal lymph nodes. Hypermetabolic 1.0 cm deep subcutaneous soft tissue mass in the lateral right posterior chest wall at the level of the lower scapula with max SUV 7.3 (series 4/image 78). Incidental CT findings: Granulomatous calcified bilateral hilar, AP window, right paratracheal and left prevascular lymph nodes. ABDOMEN/PELVIS: No abnormal hypermetabolic activity within the liver, pancreas, adrenal glands, or spleen.  No hypermetabolic lymph nodes in the abdomen. Mildly hypermetabolic nonenlarged 0.6 cm left inguinal lymph node with max SUV 3.9 (series 4/image 194). No additional hypermetabolic pelvic lymph nodes. Diffuse colorectal hypermetabolism, including hypermetabolism at the site of annular wall thickening in the hepatic flexure of the colon with max SUV 16.7 (series 4/image 129). Incidental CT findings: Granulomatous calcifications throughout the liver and spleen. Cholelithiasis with contracted gallbladder. Atherosclerotic nonaneurysmal abdominal aorta. Marked diffuse colonic diverticulosis, most prominent in the sigmoid colon. SKELETON: No focal hypermetabolic activity to  suggest skeletal metastasis. Incidental CT findings: none IMPRESSION: 1. Diffuse colorectal hypermetabolism, including at the site of annular wall thickening in the hepatic flexure of the colon, compatible with known primary colonic malignancy. 2. No hypermetabolic locoregional adenopathy or liver metastases. 3. Intensely hypermetabolic 2.8 cm deep subcutaneous soft tissue mass in the posterior lower left neck near the midline adjacent to the tip of the C7 spinous process, suspicious for metastatic disease. 4. Similar hypermetabolic 1.0 cm deep subcutaneous soft tissue mass in the lateral right posterior chest wall at the level of the lower scapula, also suspicious for metastatic disease. 5. Mild hypermetabolism (max SUV 3.1) associated with irregular 1.9 cm subpleural apical left upper lobe pulmonary nodule, cannot exclude malignancy, including possibly a metachronous primary bronchogenic carcinoma. 6. Intense bilateral hilar nodal hypermetabolism, suspicious for metastatic disease. 7. Mildly hypermetabolic nonenlarged left inguinal lymph node, metastasis not excluded. 8. Chronic findings include: Aortic Atherosclerosis (ICD10-I70.0). Cholelithiasis. Marked diffuse colonic diverticulosis. Electronically Signed   By: Ilona Sorrel M.D.   On: 08/13/2019  17:00    ASSESSMENT & PLAN:  NICO SYME is a 86 y.o. Caucasian male with a history of Arthritis, CAD, DM, GERD, HTN, Glaucoma, Macular Degeneration, H/o MI in 09/2014.    1. Colon cancer, cTxN0Mx, with indeterminate lung mass and paraspinal lesion  -I reviewed and discussed his image findings and pathology report with him and his son in great detail. After months of abdominal pain he went for colonoscopy/endoscopy on 07/26/19 which shows partially obstructing tumor at hepatic flexure which was biopsied and found to be adenocarcinoma.  -His PET from 08/13/19 shows 1.9 cm subpleural apical left upper lobe pulmonary nodule with low-grade metabolic activity, metastatic lesion or primary lung cancer are possible..Scan also shows hypermetabolic b/l hilar LNs which are most consistent with granuloma,  hypermetabolic 2.8 cm deep subcutaneous soft tissue mass in the posterior lower left neck and 1cm subcutaneous soft tissue mass near scapula which are suspicious for metastatic disease.  We reviewed his scans in our GI tumor board yesterday. -Given easy location, I discussed the option of US guided biopsy of his subcutaneous mass in neck for possible further diagnosis and staging.  Patient declined.  He is not concerned about metastatic disease since he does not want surgery or chemotherapy. -Dr. Dema Severin notes given his age, surgery will be a challenge for him. The patient is not interested in surgery.  -Given his age I do not recommend standard IV chemotherapy. I did discuss his option of oral chemo Xeloda with main treatment goal of prolong his life.  His tumor MMR was normal, so he is not a candidate for immunotherapy.  I can request Foundation One on biopsy sample to see if he is eligible for target therapy but these options would not be as effective as chemo.  Patient clearly voiced wants quality of life, which does not change prolong his life.  After lengthy discussion, we will forego systemic therapy. -I  discussed the high risk of bowel obstruction from his primary colon tumor, and option of colonic stent placement to help prevent further bowel obstruction which can impact his quality of life. He is interested  -I discussed without any treatment or intervention his cancer will grow and further obstruct his bowel which can lead to perforation and eventually take his life at estimated less than 1 year.  Symptoms will also worsen as disease progresses.  -He is overall not interested in further workup or any treatment. His goal is to continue living  independently for as long as he can. After lengthy discussion he is interested in consult for stent placement. After stent is placed, I do not plan to given systemic treatments.  -I discussed referring Hospice home care to help him further. He and his son are willing to consult but after stent placement. I will refer him to palliative care in the meantime. He is agreeable. I will continue to oversee his care.  -F/u open    2. Abdominal pain, Loose Stool, Abdominal bloating  -Secondary to #1  -Pain has been intermittent and random for the past 6 months along with abdominal gas/bloating. Managed with Tylenol as needed.  -He has chronic loose stools, but manageable.  -His partial bowel obstruction from tumor is also a cause of his pain. I discussed low residual diet and nutritional supplement as it is easy to pass.  -He may need to change diet further if stent cannot be placed. I will refer him to dietician for more management.    3. Comorbidities: Blindness from Glaucoma and Macular Degeneration, H/o MI (09/20/14), arthritis, DM -He has had declining vision since 1981, stopped driving in 9702 and now is near total blindness. He ambulates with cane and has walker.  -On Medication for DM and on HTN and cardiac medications.  -He [facility] in [facility]. He has 4 children, 2 of which are in town. His son brings groceries and prepares meals for him,  but patient tries to be as independent as possible.    PLAN:  -I will sent a message to Dr Hilarie Fredrickson for possible stent placement  -Will send home palliative care referral  -Dietician referral  -F/u open, pt and his son will call us if needed    No orders of the defined types were placed in this encounter.   All questions were answered. The patient knows to call the clinic with any problems, questions or concerns. The total time spent in the appointment was 60 minutes.     Truitt Merle, MD 08/19/2019 5:27 PM  I, Joslyn Devon, am acting as scribe for Truitt Merle, MD.   I have reviewed the above documentation for accuracy and completeness, and I agree with the above.

## 2019-08-18 ENCOUNTER — Other Ambulatory Visit: Payer: Self-pay

## 2019-08-19 ENCOUNTER — Other Ambulatory Visit: Payer: Self-pay

## 2019-08-19 ENCOUNTER — Inpatient Hospital Stay: Payer: PPO | Attending: Hematology | Admitting: Hematology

## 2019-08-19 ENCOUNTER — Encounter: Payer: Self-pay | Admitting: Hematology

## 2019-08-19 DIAGNOSIS — M129 Arthropathy, unspecified: Secondary | ICD-10-CM | POA: Diagnosis not present

## 2019-08-19 DIAGNOSIS — R911 Solitary pulmonary nodule: Secondary | ICD-10-CM | POA: Insufficient documentation

## 2019-08-19 DIAGNOSIS — R140 Abdominal distension (gaseous): Secondary | ICD-10-CM | POA: Insufficient documentation

## 2019-08-19 DIAGNOSIS — R221 Localized swelling, mass and lump, neck: Secondary | ICD-10-CM | POA: Insufficient documentation

## 2019-08-19 DIAGNOSIS — K219 Gastro-esophageal reflux disease without esophagitis: Secondary | ICD-10-CM | POA: Diagnosis not present

## 2019-08-19 DIAGNOSIS — I700 Atherosclerosis of aorta: Secondary | ICD-10-CM | POA: Diagnosis not present

## 2019-08-19 DIAGNOSIS — I10 Essential (primary) hypertension: Secondary | ICD-10-CM | POA: Insufficient documentation

## 2019-08-19 DIAGNOSIS — C182 Malignant neoplasm of ascending colon: Secondary | ICD-10-CM | POA: Insufficient documentation

## 2019-08-19 DIAGNOSIS — K449 Diaphragmatic hernia without obstruction or gangrene: Secondary | ICD-10-CM | POA: Diagnosis not present

## 2019-08-19 DIAGNOSIS — C183 Malignant neoplasm of hepatic flexure: Secondary | ICD-10-CM | POA: Diagnosis not present

## 2019-08-19 DIAGNOSIS — I252 Old myocardial infarction: Secondary | ICD-10-CM | POA: Insufficient documentation

## 2019-08-19 DIAGNOSIS — K8020 Calculus of gallbladder without cholecystitis without obstruction: Secondary | ICD-10-CM | POA: Insufficient documentation

## 2019-08-19 DIAGNOSIS — K648 Other hemorrhoids: Secondary | ICD-10-CM | POA: Insufficient documentation

## 2019-08-19 DIAGNOSIS — K5730 Diverticulosis of large intestine without perforation or abscess without bleeding: Secondary | ICD-10-CM | POA: Insufficient documentation

## 2019-08-19 DIAGNOSIS — N400 Enlarged prostate without lower urinary tract symptoms: Secondary | ICD-10-CM | POA: Insufficient documentation

## 2019-08-19 DIAGNOSIS — E1136 Type 2 diabetes mellitus with diabetic cataract: Secondary | ICD-10-CM | POA: Insufficient documentation

## 2019-08-19 DIAGNOSIS — Z8043 Family history of malignant neoplasm of testis: Secondary | ICD-10-CM | POA: Diagnosis not present

## 2019-08-19 DIAGNOSIS — I2510 Atherosclerotic heart disease of native coronary artery without angina pectoris: Secondary | ICD-10-CM | POA: Insufficient documentation

## 2019-08-19 DIAGNOSIS — L570 Actinic keratosis: Secondary | ICD-10-CM | POA: Insufficient documentation

## 2019-08-19 DIAGNOSIS — G2581 Restless legs syndrome: Secondary | ICD-10-CM | POA: Insufficient documentation

## 2019-08-19 DIAGNOSIS — Z87891 Personal history of nicotine dependence: Secondary | ICD-10-CM | POA: Insufficient documentation

## 2019-08-19 DIAGNOSIS — Z7982 Long term (current) use of aspirin: Secondary | ICD-10-CM | POA: Diagnosis not present

## 2019-08-19 DIAGNOSIS — Z79899 Other long term (current) drug therapy: Secondary | ICD-10-CM | POA: Diagnosis not present

## 2019-08-19 DIAGNOSIS — F0390 Unspecified dementia without behavioral disturbance: Secondary | ICD-10-CM | POA: Insufficient documentation

## 2019-08-19 DIAGNOSIS — Z7984 Long term (current) use of oral hypoglycemic drugs: Secondary | ICD-10-CM | POA: Insufficient documentation

## 2019-08-19 NOTE — Progress Notes (Signed)
Met with patient and his son Timothy Snyder after their consult with Dr. Burr Medico today.  They were given my card with my direct contact information.

## 2019-08-20 ENCOUNTER — Telehealth: Payer: Self-pay | Admitting: Hematology

## 2019-08-20 ENCOUNTER — Other Ambulatory Visit: Payer: Self-pay

## 2019-08-20 DIAGNOSIS — C182 Malignant neoplasm of ascending colon: Secondary | ICD-10-CM

## 2019-08-20 NOTE — Telephone Encounter (Signed)
F/u open per 7/1 los.

## 2019-08-20 NOTE — Progress Notes (Signed)
Referral, ov note, and demographics faxed to [facility] ambulatory palliative care.

## 2019-08-22 ENCOUNTER — Encounter: Payer: Self-pay | Admitting: Internal Medicine

## 2019-08-23 ENCOUNTER — Telehealth: Payer: Self-pay | Admitting: *Deleted

## 2019-08-23 NOTE — Telephone Encounter (Signed)
Received a Palliative care referral from Dr. Burr Medico. Called and spoke with patient's son, Timothy Snyder. Visit scheduled for 08/25/19 @2p .

## 2019-08-24 ENCOUNTER — Other Ambulatory Visit: Payer: Self-pay

## 2019-08-24 ENCOUNTER — Telehealth: Payer: Self-pay | Admitting: Internal Medicine

## 2019-08-24 DIAGNOSIS — R933 Abnormal findings on diagnostic imaging of other parts of digestive tract: Secondary | ICD-10-CM

## 2019-08-24 DIAGNOSIS — C801 Malignant (primary) neoplasm, unspecified: Secondary | ICD-10-CM

## 2019-08-24 DIAGNOSIS — K6389 Other specified diseases of intestine: Secondary | ICD-10-CM

## 2019-08-24 NOTE — Telephone Encounter (Signed)
Great, thanks.    Timothy Snyder has discussed colonoscopy with stenting and he agrees to go ahead. Let's plan on Aug 19th.  Thanks

## 2019-08-24 NOTE — Telephone Encounter (Signed)
Timothy Snyder,  Dr. Hilarie Fredrickson is going to discuss with the patient and his son. If he agrees then we will offer him Thursday, August 19 for colonoscopy with colon stent placement, prep is a full colon. Do not contact until after Dr. Mamie Nick has let you know the outcome of their discussion. Thanks

## 2019-08-24 NOTE — Telephone Encounter (Signed)
I have spoken with Dr. Ardis Hughs who was present at the GI multidisciplinary conference with Dr. Burr Medico and Dr. Dema Severin We are asked about colonic stent for his known adenocarcinoma of the hepatic flexure The patient has elected not to pursue surgical therapy or chemotherapy  I have discussed with Dr. Ardis Hughs who has reviewed imaging and records Dr. Ardis Hughs feels it is reasonable to consider colonic stent across the narrowing caused by the colon cancer near the hepatic flexure.    I have reviewed this with the patient's son Dominica Severin by phone this afternoon.  We discussed the risks, benefits and alternatives to colonic stent placement.  We discussed the specific risks of perforation, bleeding, infection and stent migration causing further complications.  He understands that this is palliative and would not be expected to extend his life.  We also discussed that should a perforation occur shortly after stent placement that an operation would be necessary to potentially save his life.  After this discussion they are interested in proceeding with stent placement as above This will likely be on 10/07/2019 at [facility] Time provided for questions and answers.  I advised that he let us know by calling us back if further questions arise or if they do not wish to proceed with stent placement.  He voiced understanding and thanked me for the call  I let him know that our office would contact him regarding the specific appointment details/date/time  JMP

## 2019-08-24 NOTE — Telephone Encounter (Signed)
10/07/19 colon at [facility] with Dr Ardis Hughs COVID 8/16 11 am The pt son has been advised and instructed  All information has been sent to My Chart and the son confirmed he is able to log in and see information and will let me know if he has any questions

## 2019-08-25 ENCOUNTER — Other Ambulatory Visit: Payer: Self-pay

## 2019-08-25 ENCOUNTER — Telehealth: Payer: Self-pay | Admitting: Gastroenterology

## 2019-08-25 ENCOUNTER — Other Ambulatory Visit: Payer: PPO

## 2019-08-25 ENCOUNTER — Telehealth: Payer: Self-pay | Admitting: Hematology

## 2019-08-25 ENCOUNTER — Other Ambulatory Visit: Payer: PPO | Admitting: *Deleted

## 2019-08-25 ENCOUNTER — Telehealth: Payer: Self-pay

## 2019-08-25 DIAGNOSIS — C182 Malignant neoplasm of ascending colon: Secondary | ICD-10-CM

## 2019-08-25 DIAGNOSIS — Z515 Encounter for palliative care: Secondary | ICD-10-CM

## 2019-08-25 NOTE — Telephone Encounter (Signed)
Dr Ardis Hughs the pt son called to cancel the case he says the pt is adamant that he does not want to have anything else done.  I have cancelled the case

## 2019-08-25 NOTE — Telephone Encounter (Signed)
Pt's son states the pt would like to cancel the Colonoscopy scheduled at the hospital with Dr Ardis Hughs in August. Caller states the pt is very adamant in having the procedure done.

## 2019-08-25 NOTE — Telephone Encounter (Signed)
Received a call from patient's son Timothy Snyder that patient has decided not to get a stent placed by Dr. Hilarie Fredrickson.  They have already called the office and cancelled this procedure.  They are requesting a referral to [facility].  Dr. Burr Medico was made aware.  I have faxed the referral to [facility] and asked that his son be contacted.

## 2019-08-25 NOTE — Telephone Encounter (Signed)
Okay, thanks I will alert other members of his care team about his decision to cancel the colonoscopy and stent.

## 2019-08-25 NOTE — Telephone Encounter (Signed)
I called pt's son. I was informed that pt does not want colonic stent placement, and wants to enroll to hospice program. Referral was made today and pt and his son have already met hospice team today. I told pt's son that I am available if they have any questions or anything I can help them. He appreciated the call.  Timothy Snyder  08/25/2019

## 2019-08-26 NOTE — Progress Notes (Signed)
COMMUNITY PALLIATIVE CARE SW NOTE  PATIENT NAME: Timothy Snyder DOB: 1927-08-31 MRN: [id number]  PRIMARY CARE PROVIDER: Janith Lima, MD  RESPONSIBLE PARTY:  Acct ID - Guarantor Home Phone Work Phone Relationship Acct Type  [id number] - [name],[name]  Self P/F     [address], [place], [place] [address]     PLAN OF CARE and INTERVENTIONS:             1. GOALS OF CARE/ ADVANCE CARE PLANNING:  Goal is for patient to have comfort and quality of life and remain in his apartment for a long as possible. Patient is a DNR, form in the home. 2. SOCIAL/EMOTIONAL/SPIRITUAL ASSESSMENT/ INTERVENTIONS:  LCSW and RN-Monishia Howard completed initial visit at patient's apartment. His son was present with him. Patient informed the team that he was going to have a stint put in, but has decided not to do it and therefore hospice is coming later today to assess him for services. The team provided education to patient and his son regarding the differences in palliative and hospice services. Patient stressed that he declined the procedure because he desires to focus on comfort and have a good quality of life, instead of quantity. Patient also shared social history on himself. 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 Patient has been a resident at the facility for 8 years. Patient desired to be a DNR and forms were left in the home. The team advised them both that they will follow-up following his assessment to determine if palliative will continue. 3. PATIENT/CAREGIVER EDUCATION/ COPING:  Patient is a alert and oriented x3. He is hard of hearing and vision deficits, but was cordial and engaged. He has a supportive family and facility staff as he is a long-time resident of the facility.  4. PERSONAL EMERGENCY PLAN:  Per facility protocol 5. COMMUNITY RESOURCES COORDINATION/ HEALTH CARE NAVIGATION:  Patient to be assessed for hospice services. 6. FINANCIAL/LEGAL  CONCERNS/INTERVENTIONS: No legal or financial concerns.      SOCIAL HX:  Social History   Tobacco Use  . Smoking status: Former Smoker    Years: 10.00    Types: Pipe, Cigars    Quit date: 02/19/1959    Years since quitting: 60.5  . Smokeless tobacco: Never Used  Substance Use Topics  . Alcohol use: No    CODE STATUS: DNR ADVANCED DIRECTIVES: No MOST FORM COMPLETE:  No HOSPICE EDUCATION PROVIDED: Yes, patient to be assessed for services.  PPS: Patient is alert and oriented x3, hard of hearing and has vision deficits. He ambulates independently with a cane.   Duration of visit and documentation: 45 minutes      Katheren Puller, LCSW

## 2019-08-30 ENCOUNTER — Telehealth: Payer: Self-pay | Admitting: Nutrition

## 2019-08-30 NOTE — Telephone Encounter (Signed)
Cancelled appt per 7/8 staff msg

## 2019-09-01 ENCOUNTER — Inpatient Hospital Stay: Payer: PPO | Admitting: Nutrition

## 2019-09-01 NOTE — Progress Notes (Signed)
COMMUNITY PALLIATIVE CARE RN NOTE  PATIENT NAME: Timothy Snyder DOB: [birth year] MRN: [id number]  PRIMARY CARE PROVIDER: Janith Lima, MD  RESPONSIBLE PARTY: Fara Chute (son) Acct ID - Guarantor Home Phone Work Phone Relationship Acct Type  [id number] - Boys,[name]  Self P/F     [address], [place], [place] [address]   Covid-19 Pre-screening Negative  PLAN OF CARE and INTERVENTION:  1. ADVANCE CARE PLANNING/GOALS OF CARE: Goal is for [place] remain [place] at [place] and focusing on quality of life vs quantity. He is a DNR but did not have goldenrod form in the home. Gave son 2 copies to leave in the home. 2. PATIENT/CAREGIVER EDUCATION: Explained Palliative care services vs Hospice services, goals of care 3. DISEASE STATUS: Joint visit made with Palliative care SW, Monica Lonon. Met with patient and his son, Dominica Severin, in patient's apartment. Patient is alert and oriented x3, pleasant and engaging. He denies pain at this time. Respirations are even regular and unlabored. His gait is somewhat unsteady and he uses a cane. Reviewed health history. His son spoke of his dad's diagnosis of colon cancer. He had been contemplating whether or not to get a stent placed in his ascending colon. Since Palliative care consult was made, patient has now decided against having this procedure, because he feels the risks involved outweigh the benefits. He feels that he has lived a good life. Provided active listening and support. Patient participated in life review. His son advised that since patient has decided not to receive the stent, he has a visit with our hospice AV nurse today at 3:30 pm. I reached out to the hospice AV nurse, Adam, to advise that a Palliative care visit was made and provided update. After Adam's visit, patient was admitted to hospice services with [facility].   HISTORY OF PRESENT ILLNESS: This is a 86 yo male with a diagnosis of colon cancer. He has a  very supportive family. He will now be followed under hospice care.   CODE STATUS: DNR ADVANCED DIRECTIVES: Y MOST FORM: no PPS: 40%   (Duration of visit and documentation 45 minutes)   Daryl Eastern, RN BSN

## 2019-10-04 ENCOUNTER — Other Ambulatory Visit (HOSPITAL_COMMUNITY): Admission: RE | Admit: 2019-10-04 | Payer: PPO | Source: Ambulatory Visit

## 2019-10-07 ENCOUNTER — Ambulatory Visit (HOSPITAL_COMMUNITY): Admit: 2019-10-07 | Payer: PPO | Admitting: Gastroenterology

## 2019-10-07 ENCOUNTER — Encounter (HOSPITAL_COMMUNITY): Payer: Self-pay

## 2019-10-07 SURGERY — COLONOSCOPY WITH PROPOFOL
Anesthesia: Monitor Anesthesia Care

## 2019-10-20 ENCOUNTER — Other Ambulatory Visit: Payer: Self-pay | Admitting: Hematology

## 2019-10-20 ENCOUNTER — Other Ambulatory Visit: Payer: Self-pay | Admitting: Internal Medicine

## 2019-10-20 DIAGNOSIS — N400 Enlarged prostate without lower urinary tract symptoms: Secondary | ICD-10-CM

## 2019-10-21 ENCOUNTER — Other Ambulatory Visit: Payer: Self-pay

## 2019-10-21 MED ORDER — SENNA 8.6 MG PO TABS: ORAL_TABLET | ORAL | 1 refills | Status: AC

## 2019-10-21 NOTE — Progress Notes (Signed)
Senna refill request completed

## 2019-10-26 ENCOUNTER — Telehealth: Payer: Self-pay

## 2019-10-26 NOTE — Telephone Encounter (Signed)
Manuela Schwartz from [facility] left vm for verbal order to continue services for Mr Blades.  I returned her call and extended services.

## 2019-12-24 ENCOUNTER — Encounter: Payer: Self-pay | Admitting: Internal Medicine

## 2019-12-29 ENCOUNTER — Encounter: Payer: PPO | Admitting: Internal Medicine

## 2020-01-18 ENCOUNTER — Other Ambulatory Visit: Payer: Self-pay | Admitting: Internal Medicine

## 2020-01-18 DIAGNOSIS — I214 Non-ST elevation (NSTEMI) myocardial infarction: Secondary | ICD-10-CM

## 2020-01-18 DIAGNOSIS — I10 Essential (primary) hypertension: Secondary | ICD-10-CM

## 2020-01-18 DIAGNOSIS — I2510 Atherosclerotic heart disease of native coronary artery without angina pectoris: Secondary | ICD-10-CM

## 2020-01-18 DIAGNOSIS — Z794 Long term (current) use of insulin: Secondary | ICD-10-CM

## 2020-01-24 ENCOUNTER — Other Ambulatory Visit (HOSPITAL_COMMUNITY): Payer: Self-pay | Admitting: Hematology

## 2020-01-24 MED FILL — LORazepam 0.5 MG TABS: 0.5 | 5 days supply | Qty: 30 | Fill #0

## 2020-01-24 MED FILL — MORPHINE SULF 100 MG/5 ML S: 100 | 20 days supply | Qty: 30 | Fill #0

## 2020-02-19 DEATH — deceased

## 2020-09-15 IMAGING — PT NM PET TUM IMG INITIAL (PI) SKULL BASE T - THIGH
1 series · 8 of 8 positions shown · non-contrast
Comparison: 08/03/2019 CT chest, abdomen and pelvis.

CLINICAL DATA: Initial treatment strategy for newly diagnosed
hepatic flexure colon cancer.

EXAM:
NUCLEAR MEDICINE PET SKULL BASE TO THIGH
TECHNIQUE: 9.8 mCi F-18 FDG was injected intravenously. Full-ring PET imaging
was performed from the skull base to thigh after the radiotracer. CT
data was obtained and used for attenuation correction and anatomic
localization.
Fasting blood glucose: 123 mg/dl

[Series 1060: results mm oncology reading · 1.0mm · 0.50mm/px · 8 of 8 slices shown]
[im 1/8]
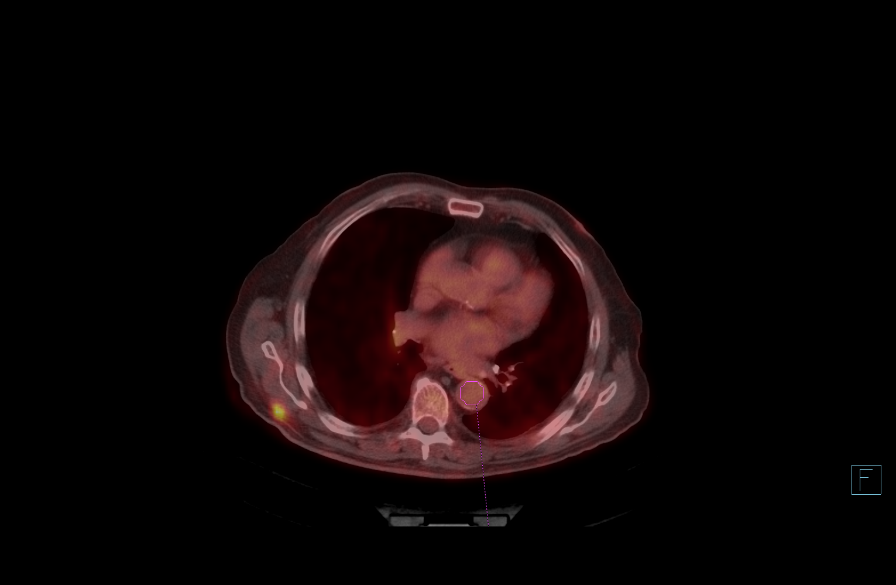
[im 2/8]
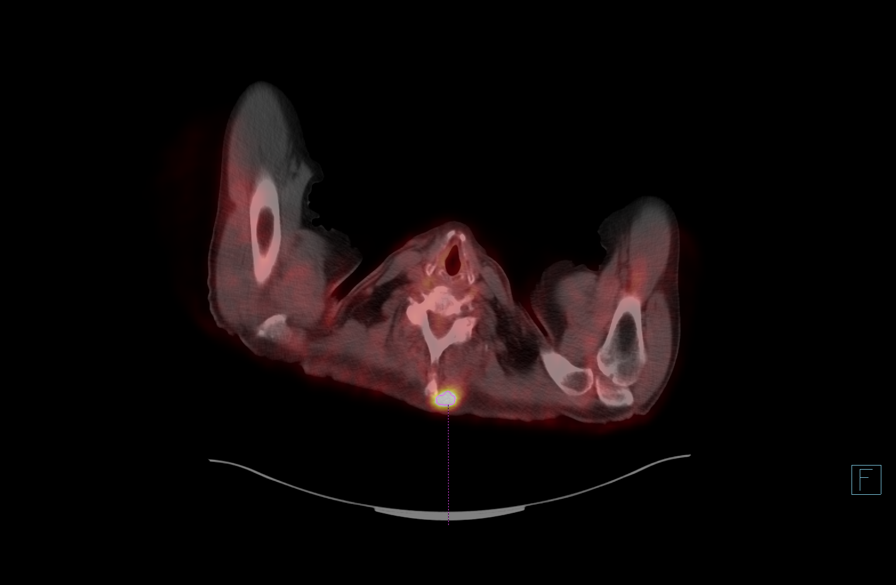
[im 3/8]
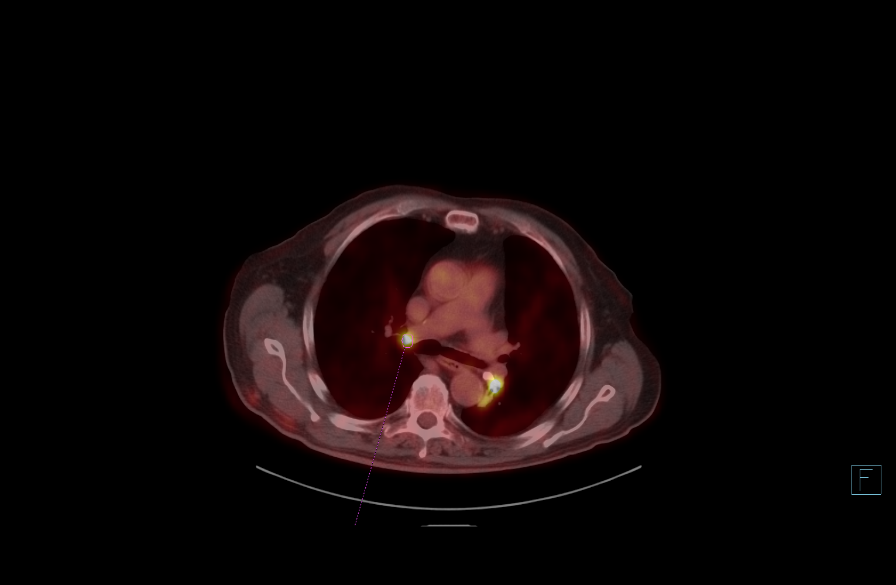
[im 4/8]
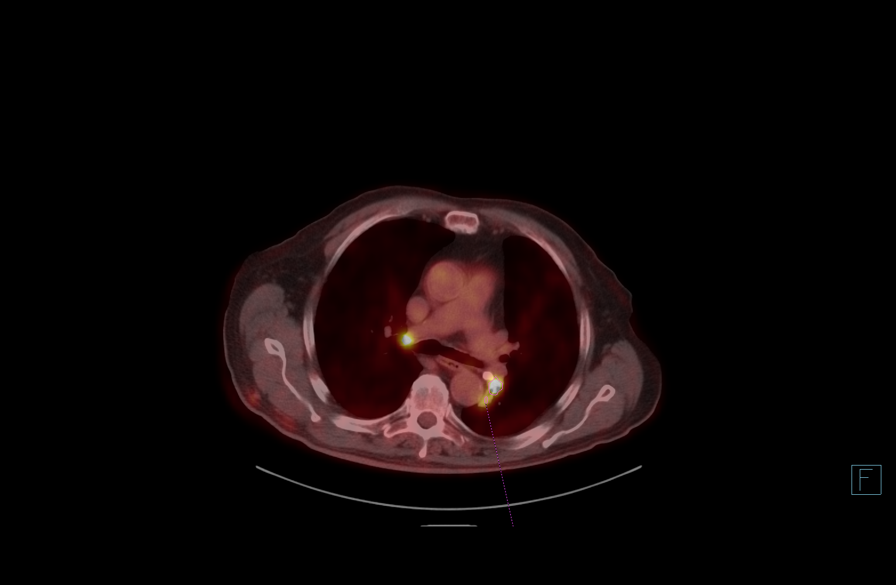
[im 5/8]
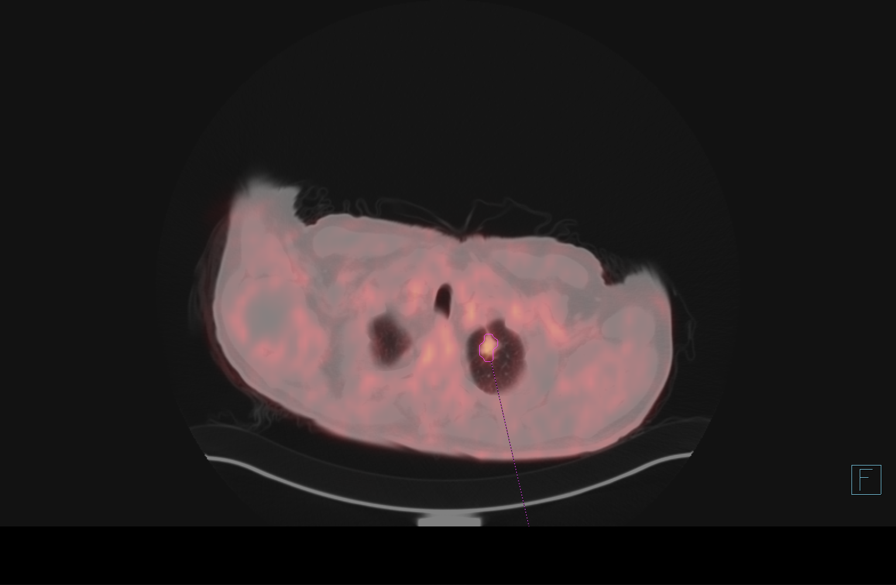
[im 6/8]
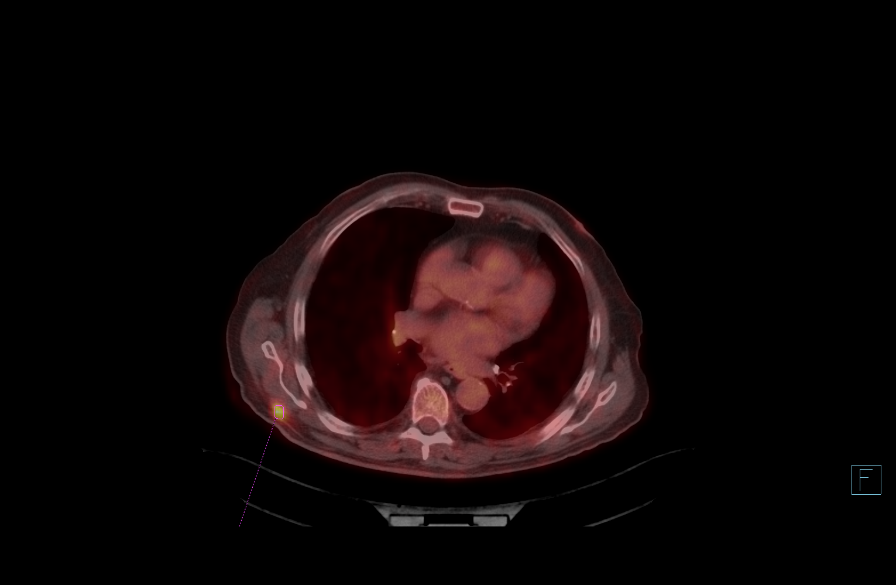
[im 7/8]
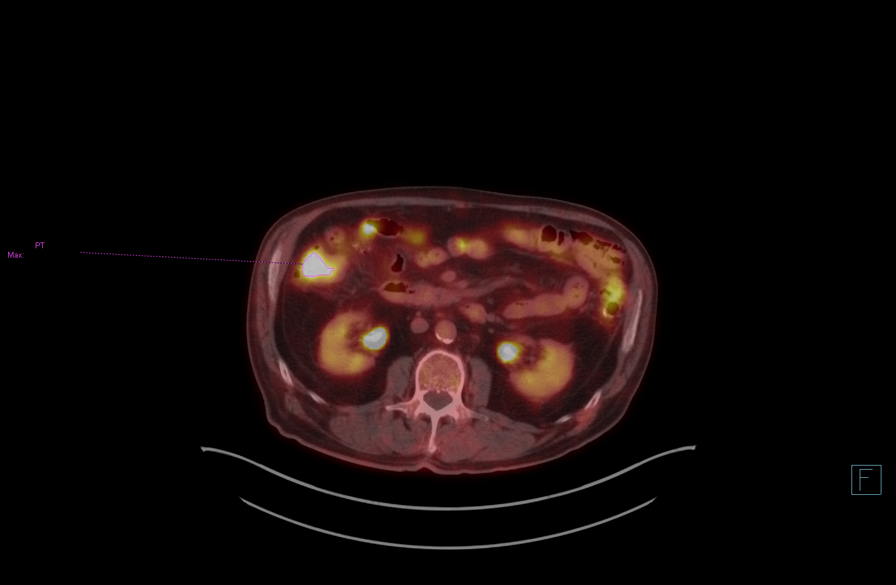
[im 8/8]
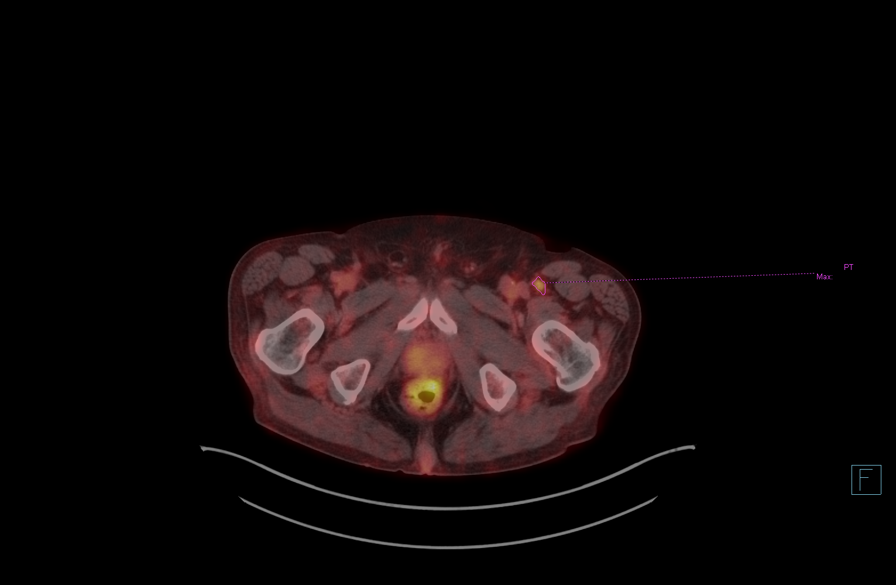

[8 of 8 positions shown; findings below may reference images not displayed]

FINDINGS: Mediastinal blood pool activity: SUV max

Liver activity: SUV max NA

NECK: No hypermetabolic lymph nodes in the neck.

Hypermetabolic 2.8 x 1.3 cm deep subcutaneous soft tissue mass in
the posterior lower neck just to the left of midline with max SUV
19.4 (series 4/image 41), adjacent to the tip of the C7 spinous
process.

Incidental CT findings: none

CHEST:

Mildly hypermetabolic irregular 1.9 x 1.6 cm subpleural nodule in
the apical left upper lobe with max SUV 3.1 (series 8/image 6). No
additional hypermetabolic pulmonary findings. Nodular 1.9 cm
subpleural medial basilar left lower lobe opacity (series 8/image
43) is non hypermetabolic and presumably benign. Right upper lobe
0.5 cm solid pulmonary nodule (series 8/image 27), below PET
resolution.

Bilateral hilar nodal hypermetabolism with max SUV 13.3 on the left
and max SUV 11.3 in the right. No hypermetabolic axillary or
mediastinal lymph nodes.

Hypermetabolic 1.0 cm deep subcutaneous soft tissue mass in the
lateral right posterior chest wall at the level of the lower scapula
with max SUV 7.3 (series 4/image 78).

Incidental CT findings: Granulomatous calcified bilateral hilar, AP
window, right paratracheal and left prevascular lymph nodes.

ABDOMEN/PELVIS: No abnormal hypermetabolic activity within the
liver, pancreas, adrenal glands, or spleen. No hypermetabolic lymph
nodes in the abdomen.

Mildly hypermetabolic nonenlarged 0.6 cm left inguinal lymph node
with max SUV 3.9 (series 4/image 194). No additional hypermetabolic
pelvic lymph nodes.

Diffuse colorectal hypermetabolism, including hypermetabolism at the
site of annular wall thickening in the hepatic flexure of the colon
with max SUV 16.7 (series 4/image 129).

Incidental CT findings: Granulomatous calcifications throughout the
liver and spleen. Cholelithiasis with contracted gallbladder.
Atherosclerotic nonaneurysmal abdominal aorta. Marked diffuse
colonic diverticulosis, most prominent in the sigmoid colon.

SKELETON: No focal hypermetabolic activity to suggest skeletal
metastasis.

Incidental CT findings: none
IMPRESSION: 1. Diffuse colorectal hypermetabolism, including at the site of
annular wall thickening in the hepatic flexure of the colon,
compatible with known primary colonic malignancy.
2. No hypermetabolic locoregional adenopathy or liver metastases.
3. Intensely hypermetabolic 2.8 cm deep subcutaneous soft tissue
mass in the posterior lower left neck near the midline adjacent to
the tip of the C7 spinous process, suspicious for metastatic
disease.
4. Similar hypermetabolic 1.0 cm deep subcutaneous soft tissue mass
in the lateral right posterior chest wall at the level of the lower
scapula, also suspicious for metastatic disease.
5. Mild hypermetabolism (max SUV 3.1) associated with irregular
cm subpleural apical left upper lobe pulmonary nodule, cannot
exclude malignancy, including possibly a metachronous primary
bronchogenic carcinoma.
6. Intense bilateral hilar nodal hypermetabolism, suspicious for
metastatic disease.
7. Mildly hypermetabolic nonenlarged left inguinal lymph node,
metastasis not excluded.
8. Chronic findings include: Aortic Atherosclerosis ([id number].C).
Cholelithiasis. Marked diffuse colonic diverticulosis.
